# Patient Record
Sex: Female | Born: 1965 | Race: Black or African American | Hispanic: No | Marital: Single | Smoking: Current every day smoker
Health system: Southern US, Community
[De-identification: ages and names within clinical notes are randomized; demographics above are authoritative.]

## PROBLEM LIST (undated history)

## (undated) DIAGNOSIS — F319 Bipolar disorder, unspecified: Secondary | ICD-10-CM

## (undated) DIAGNOSIS — F419 Anxiety disorder, unspecified: Secondary | ICD-10-CM

## (undated) DIAGNOSIS — F4310 Post-traumatic stress disorder, unspecified: Secondary | ICD-10-CM

## (undated) DIAGNOSIS — I10 Essential (primary) hypertension: Secondary | ICD-10-CM

## (undated) HISTORY — PX: BLADDER SURGERY: SHX569

## (undated) HISTORY — PX: TUBAL LIGATION: SHX77

## (undated) HISTORY — PX: CHOLECYSTECTOMY: SHX55

---

## 1998-03-25 ENCOUNTER — Emergency Department (HOSPITAL_COMMUNITY): Admission: EM | Admit: 1998-03-25 | Discharge: 1998-03-25 | Payer: Self-pay | Admitting: Emergency Medicine

## 1998-04-06 ENCOUNTER — Emergency Department (HOSPITAL_COMMUNITY): Admission: EM | Admit: 1998-04-06 | Discharge: 1998-04-06 | Payer: Self-pay | Admitting: Emergency Medicine

## 1998-04-18 ENCOUNTER — Emergency Department (HOSPITAL_COMMUNITY): Admission: EM | Admit: 1998-04-18 | Discharge: 1998-04-18 | Payer: Self-pay | Admitting: Emergency Medicine

## 1998-05-21 ENCOUNTER — Emergency Department (HOSPITAL_COMMUNITY): Admission: EM | Admit: 1998-05-21 | Discharge: 1998-05-21 | Payer: Self-pay | Admitting: Emergency Medicine

## 1998-07-02 ENCOUNTER — Emergency Department (HOSPITAL_COMMUNITY): Admission: EM | Admit: 1998-07-02 | Discharge: 1998-07-02 | Payer: Self-pay | Admitting: Internal Medicine

## 1998-07-27 ENCOUNTER — Emergency Department (HOSPITAL_COMMUNITY): Admission: EM | Admit: 1998-07-27 | Discharge: 1998-07-27 | Payer: Self-pay | Admitting: Emergency Medicine

## 1999-07-30 ENCOUNTER — Emergency Department (HOSPITAL_COMMUNITY): Admission: EM | Admit: 1999-07-30 | Discharge: 1999-07-30 | Payer: Self-pay | Admitting: Emergency Medicine

## 1999-10-13 ENCOUNTER — Emergency Department (HOSPITAL_COMMUNITY): Admission: EM | Admit: 1999-10-13 | Discharge: 1999-10-13 | Payer: Self-pay | Admitting: Emergency Medicine

## 1999-12-04 ENCOUNTER — Emergency Department (HOSPITAL_COMMUNITY): Admission: EM | Admit: 1999-12-04 | Discharge: 1999-12-04 | Payer: Self-pay | Admitting: Emergency Medicine

## 2000-03-02 ENCOUNTER — Emergency Department (HOSPITAL_COMMUNITY): Admission: EM | Admit: 2000-03-02 | Discharge: 2000-03-02 | Payer: Self-pay | Admitting: Emergency Medicine

## 2000-04-02 ENCOUNTER — Emergency Department (HOSPITAL_COMMUNITY): Admission: EM | Admit: 2000-04-02 | Discharge: 2000-04-02 | Payer: Self-pay | Admitting: Emergency Medicine

## 2000-04-10 ENCOUNTER — Emergency Department (HOSPITAL_COMMUNITY): Admission: EM | Admit: 2000-04-10 | Discharge: 2000-04-10 | Payer: Self-pay | Admitting: Emergency Medicine

## 2000-07-13 ENCOUNTER — Emergency Department (HOSPITAL_COMMUNITY): Admission: EM | Admit: 2000-07-13 | Discharge: 2000-07-13 | Payer: Self-pay | Admitting: Emergency Medicine

## 2000-07-13 ENCOUNTER — Encounter: Payer: Self-pay | Admitting: Emergency Medicine

## 2001-01-25 ENCOUNTER — Emergency Department (HOSPITAL_COMMUNITY): Admission: EM | Admit: 2001-01-25 | Discharge: 2001-01-25 | Payer: Self-pay | Admitting: Emergency Medicine

## 2001-05-03 ENCOUNTER — Emergency Department (HOSPITAL_COMMUNITY): Admission: EM | Admit: 2001-05-03 | Discharge: 2001-05-03 | Payer: Self-pay

## 2001-05-28 ENCOUNTER — Emergency Department (HOSPITAL_COMMUNITY): Admission: EM | Admit: 2001-05-28 | Discharge: 2001-05-28 | Payer: Self-pay | Admitting: Emergency Medicine

## 2001-06-13 ENCOUNTER — Emergency Department (HOSPITAL_COMMUNITY): Admission: EM | Admit: 2001-06-13 | Discharge: 2001-06-13 | Payer: Self-pay | Admitting: *Deleted

## 2001-09-26 ENCOUNTER — Emergency Department (HOSPITAL_COMMUNITY): Admission: EM | Admit: 2001-09-26 | Discharge: 2001-09-26 | Payer: Self-pay | Admitting: Emergency Medicine

## 2001-09-26 ENCOUNTER — Encounter: Payer: Self-pay | Admitting: Emergency Medicine

## 2001-10-21 ENCOUNTER — Emergency Department (HOSPITAL_COMMUNITY): Admission: EM | Admit: 2001-10-21 | Discharge: 2001-10-21 | Payer: Self-pay | Admitting: Emergency Medicine

## 2001-10-26 ENCOUNTER — Inpatient Hospital Stay (HOSPITAL_COMMUNITY): Admission: AD | Admit: 2001-10-26 | Discharge: 2001-10-26 | Payer: Self-pay | Admitting: *Deleted

## 2001-10-28 ENCOUNTER — Inpatient Hospital Stay (HOSPITAL_COMMUNITY): Admission: AD | Admit: 2001-10-28 | Discharge: 2001-10-28 | Payer: Self-pay | Admitting: Obstetrics and Gynecology

## 2001-10-28 ENCOUNTER — Encounter: Payer: Self-pay | Admitting: Obstetrics and Gynecology

## 2001-11-01 ENCOUNTER — Emergency Department (HOSPITAL_COMMUNITY): Admission: EM | Admit: 2001-11-01 | Discharge: 2001-11-01 | Payer: Self-pay | Admitting: *Deleted

## 2001-11-08 ENCOUNTER — Emergency Department (HOSPITAL_COMMUNITY): Admission: EM | Admit: 2001-11-08 | Discharge: 2001-11-08 | Payer: Self-pay | Admitting: Emergency Medicine

## 2001-12-11 ENCOUNTER — Emergency Department (HOSPITAL_COMMUNITY): Admission: EM | Admit: 2001-12-11 | Discharge: 2001-12-11 | Payer: Self-pay | Admitting: Emergency Medicine

## 2002-02-20 ENCOUNTER — Emergency Department (HOSPITAL_COMMUNITY): Admission: EM | Admit: 2002-02-20 | Discharge: 2002-02-20 | Payer: Self-pay | Admitting: Emergency Medicine

## 2002-03-03 ENCOUNTER — Emergency Department (HOSPITAL_COMMUNITY): Admission: EM | Admit: 2002-03-03 | Discharge: 2002-03-03 | Payer: Self-pay | Admitting: Emergency Medicine

## 2002-03-04 ENCOUNTER — Encounter: Payer: Self-pay | Admitting: Emergency Medicine

## 2002-03-04 ENCOUNTER — Emergency Department (HOSPITAL_COMMUNITY): Admission: EM | Admit: 2002-03-04 | Discharge: 2002-03-04 | Payer: Self-pay | Admitting: Emergency Medicine

## 2002-03-07 ENCOUNTER — Emergency Department (HOSPITAL_COMMUNITY): Admission: EM | Admit: 2002-03-07 | Discharge: 2002-03-07 | Payer: Self-pay

## 2002-03-19 ENCOUNTER — Emergency Department (HOSPITAL_COMMUNITY): Admission: EM | Admit: 2002-03-19 | Discharge: 2002-03-19 | Payer: Self-pay | Admitting: *Deleted

## 2002-04-02 ENCOUNTER — Emergency Department (HOSPITAL_COMMUNITY): Admission: EM | Admit: 2002-04-02 | Discharge: 2002-04-02 | Payer: Self-pay | Admitting: Emergency Medicine

## 2002-04-19 ENCOUNTER — Emergency Department (HOSPITAL_COMMUNITY): Admission: EM | Admit: 2002-04-19 | Discharge: 2002-04-19 | Payer: Self-pay | Admitting: Emergency Medicine

## 2002-04-19 ENCOUNTER — Emergency Department (HOSPITAL_COMMUNITY): Admission: EM | Admit: 2002-04-19 | Discharge: 2002-04-19 | Payer: Self-pay | Admitting: *Deleted

## 2002-04-25 ENCOUNTER — Emergency Department (HOSPITAL_COMMUNITY): Admission: EM | Admit: 2002-04-25 | Discharge: 2002-04-25 | Payer: Self-pay | Admitting: Emergency Medicine

## 2002-04-26 ENCOUNTER — Emergency Department (HOSPITAL_COMMUNITY): Admission: EM | Admit: 2002-04-26 | Discharge: 2002-04-26 | Payer: Self-pay | Admitting: Emergency Medicine

## 2002-05-23 ENCOUNTER — Emergency Department (HOSPITAL_COMMUNITY): Admission: EM | Admit: 2002-05-23 | Discharge: 2002-05-23 | Payer: Self-pay | Admitting: Emergency Medicine

## 2002-06-09 ENCOUNTER — Emergency Department (HOSPITAL_COMMUNITY): Admission: EM | Admit: 2002-06-09 | Discharge: 2002-06-09 | Payer: Self-pay | Admitting: Emergency Medicine

## 2002-07-05 ENCOUNTER — Emergency Department (HOSPITAL_COMMUNITY): Admission: EM | Admit: 2002-07-05 | Discharge: 2002-07-05 | Payer: Self-pay | Admitting: Emergency Medicine

## 2002-07-14 ENCOUNTER — Emergency Department (HOSPITAL_COMMUNITY): Admission: EM | Admit: 2002-07-14 | Discharge: 2002-07-14 | Payer: Self-pay | Admitting: Emergency Medicine

## 2002-08-02 ENCOUNTER — Emergency Department (HOSPITAL_COMMUNITY): Admission: EM | Admit: 2002-08-02 | Discharge: 2002-08-02 | Payer: Self-pay | Admitting: Emergency Medicine

## 2002-08-02 ENCOUNTER — Emergency Department (HOSPITAL_COMMUNITY): Admission: AC | Admit: 2002-08-02 | Discharge: 2002-08-03 | Payer: Self-pay

## 2002-08-03 ENCOUNTER — Encounter: Payer: Self-pay | Admitting: General Surgery

## 2002-09-30 ENCOUNTER — Emergency Department (HOSPITAL_COMMUNITY): Admission: EM | Admit: 2002-09-30 | Discharge: 2002-09-30 | Payer: Self-pay | Admitting: Emergency Medicine

## 2002-10-05 ENCOUNTER — Emergency Department (HOSPITAL_COMMUNITY): Admission: EM | Admit: 2002-10-05 | Discharge: 2002-10-05 | Payer: Self-pay | Admitting: Emergency Medicine

## 2002-10-13 ENCOUNTER — Encounter: Payer: Self-pay | Admitting: Emergency Medicine

## 2002-10-13 ENCOUNTER — Emergency Department (HOSPITAL_COMMUNITY): Admission: EM | Admit: 2002-10-13 | Discharge: 2002-10-13 | Payer: Self-pay | Admitting: Emergency Medicine

## 2002-10-19 ENCOUNTER — Encounter: Admission: RE | Admit: 2002-10-19 | Discharge: 2002-11-10 | Payer: Self-pay | Admitting: Family Medicine

## 2002-10-31 ENCOUNTER — Emergency Department (HOSPITAL_COMMUNITY): Admission: EM | Admit: 2002-10-31 | Discharge: 2002-10-31 | Payer: Self-pay | Admitting: Emergency Medicine

## 2002-11-18 ENCOUNTER — Emergency Department (HOSPITAL_COMMUNITY): Admission: EM | Admit: 2002-11-18 | Discharge: 2002-11-18 | Payer: Self-pay | Admitting: Emergency Medicine

## 2003-02-18 ENCOUNTER — Emergency Department (HOSPITAL_COMMUNITY): Admission: EM | Admit: 2003-02-18 | Discharge: 2003-02-19 | Payer: Self-pay | Admitting: Emergency Medicine

## 2003-03-10 ENCOUNTER — Emergency Department (HOSPITAL_COMMUNITY): Admission: EM | Admit: 2003-03-10 | Discharge: 2003-03-10 | Payer: Self-pay | Admitting: Emergency Medicine

## 2003-06-05 ENCOUNTER — Emergency Department (HOSPITAL_COMMUNITY): Admission: EM | Admit: 2003-06-05 | Discharge: 2003-06-05 | Payer: Self-pay | Admitting: Emergency Medicine

## 2003-07-29 ENCOUNTER — Emergency Department (HOSPITAL_COMMUNITY): Admission: AD | Admit: 2003-07-29 | Discharge: 2003-07-29 | Payer: Self-pay | Admitting: Emergency Medicine

## 2003-09-11 ENCOUNTER — Emergency Department (HOSPITAL_COMMUNITY): Admission: EM | Admit: 2003-09-11 | Discharge: 2003-09-11 | Payer: Self-pay | Admitting: Emergency Medicine

## 2003-10-21 ENCOUNTER — Emergency Department (HOSPITAL_COMMUNITY): Admission: EM | Admit: 2003-10-21 | Discharge: 2003-10-21 | Payer: Self-pay | Admitting: Emergency Medicine

## 2003-12-07 ENCOUNTER — Ambulatory Visit (HOSPITAL_COMMUNITY): Admission: RE | Admit: 2003-12-07 | Discharge: 2003-12-07 | Payer: Self-pay | Admitting: Urology

## 2004-09-02 ENCOUNTER — Emergency Department (HOSPITAL_COMMUNITY): Admission: EM | Admit: 2004-09-02 | Discharge: 2004-09-02 | Payer: Self-pay | Admitting: Emergency Medicine

## 2004-09-29 ENCOUNTER — Emergency Department (HOSPITAL_COMMUNITY): Admission: EM | Admit: 2004-09-29 | Discharge: 2004-09-29 | Payer: Self-pay | Admitting: Emergency Medicine

## 2004-10-18 ENCOUNTER — Emergency Department (HOSPITAL_COMMUNITY): Admission: EM | Admit: 2004-10-18 | Discharge: 2004-10-19 | Payer: Self-pay | Admitting: Emergency Medicine

## 2004-10-28 ENCOUNTER — Emergency Department (HOSPITAL_COMMUNITY): Admission: EM | Admit: 2004-10-28 | Discharge: 2004-10-28 | Payer: Self-pay | Admitting: Emergency Medicine

## 2004-11-05 ENCOUNTER — Emergency Department (HOSPITAL_COMMUNITY): Admission: EM | Admit: 2004-11-05 | Discharge: 2004-11-05 | Payer: Self-pay | Admitting: Emergency Medicine

## 2005-01-02 ENCOUNTER — Emergency Department (HOSPITAL_COMMUNITY): Admission: EM | Admit: 2005-01-02 | Discharge: 2005-01-02 | Payer: Self-pay | Admitting: Emergency Medicine

## 2005-06-06 ENCOUNTER — Emergency Department (HOSPITAL_COMMUNITY): Admission: EM | Admit: 2005-06-06 | Discharge: 2005-06-06 | Payer: Self-pay | Admitting: Emergency Medicine

## 2005-06-21 ENCOUNTER — Emergency Department (HOSPITAL_COMMUNITY): Admission: EM | Admit: 2005-06-21 | Discharge: 2005-06-21 | Payer: Self-pay | Admitting: Emergency Medicine

## 2005-07-23 ENCOUNTER — Emergency Department (HOSPITAL_COMMUNITY): Admission: EM | Admit: 2005-07-23 | Discharge: 2005-07-23 | Payer: Self-pay | Admitting: Emergency Medicine

## 2005-09-14 ENCOUNTER — Emergency Department (HOSPITAL_COMMUNITY): Admission: EM | Admit: 2005-09-14 | Discharge: 2005-09-14 | Payer: Self-pay | Admitting: Emergency Medicine

## 2006-05-09 ENCOUNTER — Emergency Department (HOSPITAL_COMMUNITY): Admission: EM | Admit: 2006-05-09 | Discharge: 2006-05-09 | Payer: Self-pay | Admitting: Emergency Medicine

## 2006-08-27 ENCOUNTER — Emergency Department (HOSPITAL_COMMUNITY): Admission: EM | Admit: 2006-08-27 | Discharge: 2006-08-27 | Payer: Self-pay | Admitting: Emergency Medicine

## 2007-01-04 ENCOUNTER — Emergency Department (HOSPITAL_COMMUNITY): Admission: EM | Admit: 2007-01-04 | Discharge: 2007-01-04 | Payer: Self-pay | Admitting: Emergency Medicine

## 2007-01-17 ENCOUNTER — Encounter: Admission: RE | Admit: 2007-01-17 | Discharge: 2007-01-17 | Payer: Self-pay | Admitting: Gastroenterology

## 2007-02-16 ENCOUNTER — Emergency Department (HOSPITAL_COMMUNITY): Admission: EM | Admit: 2007-02-16 | Discharge: 2007-02-16 | Payer: Self-pay | Admitting: Emergency Medicine

## 2007-02-23 ENCOUNTER — Encounter (INDEPENDENT_AMBULATORY_CARE_PROVIDER_SITE_OTHER): Payer: Self-pay | Admitting: General Surgery

## 2007-02-23 ENCOUNTER — Ambulatory Visit (HOSPITAL_COMMUNITY): Admission: RE | Admit: 2007-02-23 | Discharge: 2007-02-23 | Payer: Self-pay | Admitting: General Surgery

## 2007-12-02 ENCOUNTER — Ambulatory Visit (HOSPITAL_COMMUNITY): Admission: RE | Admit: 2007-12-02 | Discharge: 2007-12-02 | Payer: Self-pay | Admitting: Obstetrics and Gynecology

## 2007-12-21 ENCOUNTER — Emergency Department (HOSPITAL_COMMUNITY): Admission: EM | Admit: 2007-12-21 | Discharge: 2007-12-21 | Payer: Self-pay | Admitting: Emergency Medicine

## 2007-12-24 ENCOUNTER — Encounter (INDEPENDENT_AMBULATORY_CARE_PROVIDER_SITE_OTHER): Payer: Self-pay | Admitting: Obstetrics and Gynecology

## 2007-12-24 ENCOUNTER — Ambulatory Visit (HOSPITAL_COMMUNITY): Admission: RE | Admit: 2007-12-24 | Discharge: 2007-12-24 | Payer: Self-pay | Admitting: Obstetrics and Gynecology

## 2008-01-20 ENCOUNTER — Emergency Department (HOSPITAL_COMMUNITY): Admission: EM | Admit: 2008-01-20 | Discharge: 2008-01-20 | Payer: Self-pay | Admitting: Emergency Medicine

## 2008-02-13 ENCOUNTER — Emergency Department (HOSPITAL_COMMUNITY): Admission: EM | Admit: 2008-02-13 | Discharge: 2008-02-13 | Payer: Self-pay | Admitting: Emergency Medicine

## 2008-04-15 ENCOUNTER — Emergency Department (HOSPITAL_COMMUNITY): Admission: EM | Admit: 2008-04-15 | Discharge: 2008-04-15 | Payer: Self-pay | Admitting: Emergency Medicine

## 2008-06-06 ENCOUNTER — Emergency Department (HOSPITAL_COMMUNITY): Admission: EM | Admit: 2008-06-06 | Discharge: 2008-06-06 | Payer: Self-pay | Admitting: Emergency Medicine

## 2008-07-01 ENCOUNTER — Emergency Department (HOSPITAL_COMMUNITY): Admission: EM | Admit: 2008-07-01 | Discharge: 2008-07-01 | Payer: Self-pay | Admitting: Emergency Medicine

## 2009-01-21 ENCOUNTER — Emergency Department (HOSPITAL_COMMUNITY): Admission: EM | Admit: 2009-01-21 | Discharge: 2009-01-21 | Payer: Self-pay | Admitting: Emergency Medicine

## 2009-02-04 ENCOUNTER — Emergency Department (HOSPITAL_COMMUNITY): Admission: EM | Admit: 2009-02-04 | Discharge: 2009-02-04 | Payer: Self-pay | Admitting: Emergency Medicine

## 2009-02-08 ENCOUNTER — Emergency Department (HOSPITAL_COMMUNITY): Admission: EM | Admit: 2009-02-08 | Discharge: 2009-02-08 | Payer: Self-pay | Admitting: Emergency Medicine

## 2009-02-08 ENCOUNTER — Encounter (INDEPENDENT_AMBULATORY_CARE_PROVIDER_SITE_OTHER): Payer: Self-pay | Admitting: Emergency Medicine

## 2009-02-08 ENCOUNTER — Ambulatory Visit: Payer: Self-pay | Admitting: Vascular Surgery

## 2009-04-30 ENCOUNTER — Emergency Department (HOSPITAL_COMMUNITY): Admission: EM | Admit: 2009-04-30 | Discharge: 2009-04-30 | Payer: Self-pay | Admitting: Emergency Medicine

## 2009-06-09 ENCOUNTER — Emergency Department (HOSPITAL_COMMUNITY): Admission: EM | Admit: 2009-06-09 | Discharge: 2009-06-09 | Payer: Self-pay | Admitting: Emergency Medicine

## 2009-06-11 ENCOUNTER — Emergency Department (HOSPITAL_COMMUNITY): Admission: EM | Admit: 2009-06-11 | Discharge: 2009-06-11 | Payer: Self-pay | Admitting: Emergency Medicine

## 2009-07-12 ENCOUNTER — Emergency Department (HOSPITAL_COMMUNITY): Admission: EM | Admit: 2009-07-12 | Discharge: 2009-07-12 | Payer: Self-pay | Admitting: Emergency Medicine

## 2009-07-18 ENCOUNTER — Emergency Department (HOSPITAL_COMMUNITY): Admission: EM | Admit: 2009-07-18 | Discharge: 2009-07-18 | Payer: Self-pay | Admitting: Emergency Medicine

## 2009-08-07 ENCOUNTER — Emergency Department (HOSPITAL_COMMUNITY): Admission: EM | Admit: 2009-08-07 | Discharge: 2009-08-07 | Payer: Self-pay | Admitting: Emergency Medicine

## 2009-09-21 ENCOUNTER — Emergency Department (HOSPITAL_COMMUNITY): Admission: EM | Admit: 2009-09-21 | Discharge: 2009-09-21 | Payer: Self-pay | Admitting: Emergency Medicine

## 2009-11-17 ENCOUNTER — Emergency Department (HOSPITAL_COMMUNITY): Admission: EM | Admit: 2009-11-17 | Discharge: 2009-11-17 | Payer: Self-pay | Admitting: Emergency Medicine

## 2009-12-01 ENCOUNTER — Emergency Department (HOSPITAL_COMMUNITY): Admission: EM | Admit: 2009-12-01 | Discharge: 2009-12-01 | Payer: Self-pay | Admitting: Emergency Medicine

## 2010-01-24 ENCOUNTER — Emergency Department (HOSPITAL_COMMUNITY): Admission: EM | Admit: 2010-01-24 | Discharge: 2010-01-24 | Payer: Self-pay | Admitting: Emergency Medicine

## 2010-02-05 ENCOUNTER — Emergency Department (HOSPITAL_COMMUNITY): Admission: EM | Admit: 2010-02-05 | Discharge: 2010-02-05 | Payer: Self-pay | Admitting: Emergency Medicine

## 2010-02-19 ENCOUNTER — Emergency Department (HOSPITAL_COMMUNITY): Admission: EM | Admit: 2010-02-19 | Discharge: 2010-02-19 | Payer: Self-pay | Admitting: Emergency Medicine

## 2010-03-12 ENCOUNTER — Emergency Department (HOSPITAL_COMMUNITY): Admission: EM | Admit: 2010-03-12 | Discharge: 2010-03-12 | Payer: Self-pay | Admitting: Emergency Medicine

## 2010-04-05 ENCOUNTER — Ambulatory Visit: Payer: Self-pay | Admitting: Internal Medicine

## 2010-04-05 ENCOUNTER — Observation Stay (HOSPITAL_COMMUNITY): Admission: EM | Admit: 2010-04-05 | Discharge: 2010-04-06 | Payer: Self-pay | Admitting: Emergency Medicine

## 2010-04-06 ENCOUNTER — Encounter (INDEPENDENT_AMBULATORY_CARE_PROVIDER_SITE_OTHER): Payer: Self-pay | Admitting: Internal Medicine

## 2010-05-14 ENCOUNTER — Emergency Department (HOSPITAL_COMMUNITY): Admission: EM | Admit: 2010-05-14 | Discharge: 2010-05-14 | Payer: Self-pay | Admitting: Emergency Medicine

## 2010-08-19 ENCOUNTER — Encounter: Payer: Self-pay | Admitting: Urology

## 2010-10-11 LAB — POCT I-STAT, CHEM 8
Calcium, Ion: 1.17 mmol/L (ref 1.12–1.32)
Chloride: 104 mEq/L (ref 96–112)
HCT: 41 % (ref 36.0–46.0)
TCO2: 25 mmol/L (ref 0–100)

## 2010-10-11 LAB — URINALYSIS, ROUTINE W REFLEX MICROSCOPIC
Glucose, UA: NEGATIVE mg/dL
Specific Gravity, Urine: 1.026 (ref 1.005–1.030)
Urobilinogen, UA: 1 mg/dL (ref 0.0–1.0)
pH: 6 (ref 5.0–8.0)

## 2010-10-11 LAB — POCT CARDIAC MARKERS
CKMB, poc: <1 ng/mL — ABNORMAL LOW (ref 1.0–8.0)
CKMB, poc: <1 ng/mL — ABNORMAL LOW (ref 1.0–8.0)
Myoglobin, poc: 37.2 ng/mL (ref 12–200)
Troponin i, poc: <0.05 ng/mL (ref 0.00–0.09)

## 2010-10-11 LAB — DIFFERENTIAL
Basophils Absolute: 0 10*3/uL (ref 0.0–0.1)
Basophils Relative: 0 % (ref 0–1)
Eosinophils Relative: 0 % (ref 0–5)
Monocytes Absolute: 0.6 10*3/uL (ref 0.1–1.0)
Neutro Abs: 8.8 10*3/uL — ABNORMAL HIGH (ref 1.7–7.7)

## 2010-10-11 LAB — URINE MICROSCOPIC-ADD ON

## 2010-10-11 LAB — CBC
HCT: 37.4 % (ref 36.0–46.0)
MCHC: 34.5 g/dL (ref 30.0–36.0)
RDW: 13.7 % (ref 11.5–15.5)

## 2010-10-12 LAB — CBC
HCT: 40.2 % (ref 36.0–46.0)
MCHC: 34.5 g/dL (ref 30.0–36.0)
RDW: 14.1 % (ref 11.5–15.5)

## 2010-10-12 LAB — DIFFERENTIAL
Basophils Absolute: 0.1 10*3/uL (ref 0.0–0.1)
Basophils Relative: 2 % — ABNORMAL HIGH (ref 0–1)
Eosinophils Absolute: 0.1 10*3/uL (ref 0.0–0.7)
Monocytes Absolute: 0.4 10*3/uL (ref 0.1–1.0)
Neutro Abs: 4.4 10*3/uL (ref 1.7–7.7)
Neutrophils Relative %: 56 % (ref 43–77)

## 2010-10-12 LAB — BASIC METABOLIC PANEL
BUN: 6 mg/dL (ref 6–23)
Calcium: 9.2 mg/dL (ref 8.4–10.5)
GFR calc non Af Amer: >60 mL/min (ref >60)
Glucose, Bld: 95 mg/dL (ref 70–99)

## 2010-10-13 LAB — URINALYSIS, ROUTINE W REFLEX MICROSCOPIC
Nitrite: NEGATIVE
Protein, ur: NEGATIVE mg/dL
Specific Gravity, Urine: 1.027 (ref 1.005–1.030)
Urobilinogen, UA: 0.2 mg/dL (ref 0.0–1.0)

## 2010-10-13 LAB — URINE MICROSCOPIC-ADD ON

## 2010-10-13 LAB — POCT PREGNANCY, URINE: Preg Test, Ur: NEGATIVE

## 2010-10-16 LAB — URINALYSIS, ROUTINE W REFLEX MICROSCOPIC
Bilirubin Urine: NEGATIVE
Ketones, ur: NEGATIVE mg/dL
Nitrite: NEGATIVE
Protein, ur: NEGATIVE mg/dL
Specific Gravity, Urine: 1.027 (ref 1.005–1.030)
Urobilinogen, UA: 0.2 mg/dL (ref 0.0–1.0)
Urobilinogen, UA: 0.2 mg/dL (ref 0.0–1.0)
pH: 6 (ref 5.0–8.0)

## 2010-10-16 LAB — URINE MICROSCOPIC-ADD ON

## 2010-10-16 LAB — URINE CULTURE: Colony Count: >=100000

## 2010-10-31 LAB — URINALYSIS, ROUTINE W REFLEX MICROSCOPIC
Bilirubin Urine: NEGATIVE
Nitrite: NEGATIVE
Specific Gravity, Urine: 1.022 (ref 1.005–1.030)
Urobilinogen, UA: 0.2 mg/dL (ref 0.0–1.0)
pH: 5.5 (ref 5.0–8.0)

## 2010-10-31 LAB — URINE MICROSCOPIC-ADD ON

## 2010-11-01 LAB — URINALYSIS, ROUTINE W REFLEX MICROSCOPIC
Glucose, UA: NEGATIVE mg/dL
Leukocytes, UA: NEGATIVE
Nitrite: NEGATIVE
Specific Gravity, Urine: 1.025 (ref 1.005–1.030)
pH: 6 (ref 5.0–8.0)

## 2010-11-01 LAB — DIFFERENTIAL
Basophils Absolute: 0.1 10*3/uL (ref 0.0–0.1)
Eosinophils Relative: 2 % (ref 0–5)
Lymphocytes Relative: 41 % (ref 12–46)
Lymphs Abs: 3.9 10*3/uL (ref 0.7–4.0)
Monocytes Absolute: 0.7 10*3/uL (ref 0.1–1.0)
Neutro Abs: 4.6 10*3/uL (ref 1.7–7.7)

## 2010-11-01 LAB — POCT I-STAT, CHEM 8
BUN: 9 mg/dL (ref 6–23)
Calcium, Ion: 1.12 mmol/L (ref 1.12–1.32)
Chloride: 105 mEq/L (ref 96–112)
Creatinine, Ser: 0.6 mg/dL (ref 0.4–1.2)
Sodium: 137 mEq/L (ref 135–145)
TCO2: 23 mmol/L (ref 0–100)

## 2010-11-01 LAB — GC/CHLAMYDIA PROBE AMP, GENITAL
Chlamydia, DNA Probe: NEGATIVE
GC Probe Amp, Genital: NEGATIVE

## 2010-11-01 LAB — CBC
HCT: 39.6 % (ref 36.0–46.0)
Hemoglobin: 13.2 g/dL (ref 12.0–15.0)
RDW: 14.4 % (ref 11.5–15.5)
WBC: 9.4 10*3/uL (ref 4.0–10.5)

## 2010-11-01 LAB — URINE MICROSCOPIC-ADD ON

## 2010-11-05 LAB — URINE MICROSCOPIC-ADD ON

## 2010-11-05 LAB — URINALYSIS, ROUTINE W REFLEX MICROSCOPIC
Glucose, UA: NEGATIVE mg/dL
Leukocytes, UA: NEGATIVE
Protein, ur: NEGATIVE mg/dL
pH: 6 (ref 5.0–8.0)

## 2010-11-05 LAB — GC/CHLAMYDIA PROBE AMP, GENITAL: Chlamydia, DNA Probe: NEGATIVE

## 2010-11-05 LAB — POCT PREGNANCY, URINE: Preg Test, Ur: NEGATIVE

## 2010-11-05 LAB — RPR: RPR Ser Ql: NONREACTIVE

## 2010-12-11 NOTE — Op Note (Signed)
NAMEGEORGANA, Jill [name] [name]               ACCOUNT NO.:  [id number]   MEDICAL RECORD NO.:  [id number]          PATIENT TYPE:  AMB   LOCATION:  DAY                          FACILITY:  [facility]   PHYSICIAN:  Timothy E. Earlene Plater, M.D. DATE OF BIRTH:  1966-04-11   DATE OF PROCEDURE:  02/23/2007  DATE OF DISCHARGE:                               OPERATIVE REPORT   PREOPERATIVE DIAGNOSES:  1. Cholecystolithiasis.  2. Question mass of gallbladder.   POSTOPERATIVE DIAGNOSES:  1. Cholecystitis.  2. Mass of gallbladder.  3. Abnormal cholangiogram.   PROCEDURE:  Laparoscopic cholecystectomy with cholangiogram.   SURGEON:  Kendrick Ranch, M.D.   ANESTHESIA:  General.   ASSISTANT:  Wilmon Arms. Tsuei, M.D.   INDICATION:  Jill [name] [name] is otherwise healthy, 22.  She has had several  attacks of right upper quadrant pain; the worst one, she visited the  emergency room, where first ultrasound was done, then CT, showing  dilated biliary ducts, question mass gallbladder, no specific stones.  She has now defervesced, though she is still taking Percocet for pain  and care was diet and she wishes to proceed fairly urgently with the  surgery.  This has been completely discussed with her in detail  including the procedure, the expectations, the potential complications  and the recovery period.  She is seen, identified and the permit signed.   She was taken to the operating room and placed supine and general  endotracheal anesthesia administered.  The abdomen was prepped and  draped with Betadine.  Marcaine 0.25% with epinephrine was used  throughout prior to each incision.  An infraumbilical incision was made  through the old tubal ligation scar, the fascia identified vertically  and opened vertically, the peritoneum entered.  A few adhesions were  bluntly taken down and then a #1 Vicryl suture placed, figure-of-eight,  across that wound and the Hasson cannula placed between the suture it  was tied.  The abdomen  insufflated nicely.  There were no gross  abnormalities with the peritoneoscopy, except for thickened gallbladder.  The pelvis was free of disease.  The liver appeared normal.  A second 10-  mm trocar was placed in the mid epigastrium and two 5-mm trocars in the  right upper quadrant.  The gallbladder was grasped and placed on  tension.  Some adhesions to the duodenum were taken down sharply and  then the cystic duct was dissected out completely under direct vision;  it appeared normal.  A cut was made in the cystic duct and bile flowed.  A clip was placed on the gallbladder side of that incision.  Then a  cholangiogram catheter was passed percutaneously and inserted into the  cystic duct stump.  A clip was placed and real-time fluoroscopy carried  out; it was clearly abnormal; however, it did not show obstruction or  stones.  There was flow of dye into the duodenum.  There appeared to be  either a significant sigmoid turn of the distal common duct or even  perhaps a redundancy.  The cystic duct was normal with a curlicue  pattern and was of good  length.  The remainder of the bile ducts  appeared dilated.  No obstruction or stones were seen.  So, we proceeded  to completely divide the cystic duct; it was triply clipped.  The cystic  artery was dissected out, triply clipped and divided.  The remainder of  the gallbladder was dissected out from the base of the liver bed without  difficulty or complications.  The radiologist did review the real-time  fluoroscopy and in essence agreed with our findings.  We will leave to  their final judgment and the patient will be referred back to [facility].  The gallbladder was placed in an EndoCatch bag; its base was carefully  visualized,  irrigated and there were no complications.  The bag was  closed.  Irrigation was carried out.  Again, view of the pelvis was  negative and the gallbladder was removed through the infraumbilical  incision without  complication.  It was tied under direct vision and  there was no bleeding or complication.  Irrigant was removed.  All  trocars were removed under direct vision and then all air was allowed to  escape.  Each incision was closed with 3-0 Monocryl and Steri-Strips.  Final counts were correct.  She tolerated it well and was removed to the  recovery room in good condition.  Written and verbal instructions were  given including additional Percocet and she will be followed as an  outpatient.Sheppard Plumber. Earlene Plater, M.D.  Electronically Signed     TED/MEDQ  D:  02/23/2007  T:  02/24/2007  Job:  [id number]   cc:   Bryan Lemma. Manus Gunning, M.D.  Fax: [phone]   [facility]

## 2010-12-11 NOTE — H&P (Signed)
Jill [name], Jill [name]               ACCOUNT NO.:  [id number]   MEDICAL RECORD NO.:  [id number]          PATIENT TYPE:  AMB   LOCATION:  SDC                           FACILITY:  [facility]   PHYSICIAN:  Hal Morales, M.D.DATE OF BIRTH:  [birth year]   DATE OF ADMISSION:  DATE OF DISCHARGE:                              HISTORY & PHYSICAL   DATE OF OPERATION:  Dec 24, 2007.   HISTORY OF PRESENT ILLNESS:  The patient is a 45 year old black female,  para 3-0-1-2, who presents for excision of perianal condylomata.  These  have been present for less than 1 year but have started to cause  significant itching, and the patient wants them removed.  Last menstrual  period was Dec 08, 2007.  Contraception is tubal ligation.   PAST MEDICAL HISTORY:  GYN:  Menarche at age 30 with monthly menses  lasting 4 days, pretty severe cramps.  Pap smear history:  The patient  had an abnormal Pap smear approximately 8 years ago and underwent  cryosurgery for that.  She has had a history of herpes and bacterial and  yeast vaginitis.  She uses tubal ligation as her method of  contraception.   OBSTETRICAL HISTORY:  The patient had 2 full-term deliveries and 1  elective termination of pregnancy in the early second trimester.   FAMILY HISTORY:  Positive for hypertension, diabetes and heart disease.   SURGICAL HISTORY:  The patient had a cholecystectomy in 2008.   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   CURRENT MEDICATIONS:  Flonase daily and Zovirax.   REVIEW OF SYSTEMS:  Negative except as mentioned above.   PHYSICAL EXAMINATION:  GENERAL:  The patient is a well-developed black  female in no acute distress.  LUNGS:  Clear.  HEART:  Regular rate and rhythm.  ABDOMEN:  Soft without masses or organomegaly.  EXTREMITIES:  No clubbing, cyanosis or edema.  PELVIC:  EG, BUS within normal limits.  The vagina is rugous.  The  cervix is without  gross lesions.  Uterus is not enlarged though  increased body mass index limits examination.  Rectovaginal shows  perianal condylomata.   IMPRESSION:  1. 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  2. History of herpes simplex virus in the past.   DISPOSITION:  A discussion was held with the patient concerning removal  of the aforementioned warts.  She wishes to proceed at [facility]  under anesthesia.  The risks of anesthesia, bleeding, infection and  damage to adjacent organs have all been explained.      Hal Morales, M.D.  Electronically Signed     VPH/MEDQ  D:  12/23/2007  T:  12/23/2007  Job:  [id number]

## 2010-12-11 NOTE — Op Note (Signed)
[name], Jill               ACCOUNT NO.:  [id number]   MEDICAL RECORD NO.:  [id number]          PATIENT TYPE:  AMB   LOCATION:  SDC                           FACILITY:  [facility]   PHYSICIAN:  Hal Morales, M.D.DATE OF BIRTH:  April 21, 1966   DATE OF PROCEDURE:  12/24/2007  DATE OF DISCHARGE:                               OPERATIVE REPORT   PREOPERATIVE DIAGNOSIS:  Perianal condylomata.   POSTOPERATIVE DIAGNOSIS:  Perianal condylomata.   OPERATION:  Excision of perianal condylomata.   SURGEON:  Hal Morales, M.D.   ANESTHESIA:  Local.   ESTIMATED BLOOD LOSS:  Less than 25 mL.   COMPLICATIONS:  None.   FINDINGS:  The patient had numerous condylomata in the perianal area  that ranging in size from 5 mm to 30 mm.   PROCEDURE:  The patient was taken to the operating room and placed on  the operating table in the lithotomy position.  The perineum and  perianal area were cleansed with Betadine.  The areas containing  condylomata were infiltrated with a solution of 30 mL 0.25% Marcaine and  20 mL 2% Xylocaine.  A total of 38 mL of this solution was used for  infiltration.  The condylomata were then successively excised sharply  with a scalpel, and the large defects closed with subcuticular sutures  of 3-0 Vicryl.  The smaller defects were cauterized along the base to  allow for adequate hemostasis.  Once adequate hemostasis had been  achieved, the operative site was covered in Silvadene cream and  hemostasis remained adequate.  The patient was then taken to the  recovery room in satisfactory condition having tolerated the procedure  well with sponge and instrument counts correct.  The specimen was sent  to pathology.      Hal Morales, M.D.  Electronically Signed     VPH/MEDQ  D:  12/24/2007  T:  12/25/2007  Job:  [id number]

## 2011-02-19 ENCOUNTER — Emergency Department (HOSPITAL_COMMUNITY)
Admission: EM | Admit: 2011-02-19 | Discharge: 2011-02-19 | Disposition: A | Discharge status: Home / Self Care | Payer: Self-pay | Attending: Emergency Medicine | Admitting: Emergency Medicine

## 2011-02-19 DIAGNOSIS — L02419 Cutaneous abscess of limb, unspecified: Secondary | ICD-10-CM | POA: Insufficient documentation

## 2011-02-19 DIAGNOSIS — L989 Disorder of the skin and subcutaneous tissue, unspecified: Secondary | ICD-10-CM | POA: Insufficient documentation

## 2011-02-19 DIAGNOSIS — L03119 Cellulitis of unspecified part of limb: Secondary | ICD-10-CM | POA: Insufficient documentation

## 2011-02-19 DIAGNOSIS — I10 Essential (primary) hypertension: Secondary | ICD-10-CM | POA: Insufficient documentation

## 2011-04-23 IMAGING — CR DG KNEE COMPLETE 4+V*R*
4 series · 4 of 4 positions shown · non-contrast
Comparison: 02/04/2009

CLINICAL DATA: Twisted right knee - medial pain

RIGHT KNEE - COMPLETE 4+ VIEW

[t knee ap right]
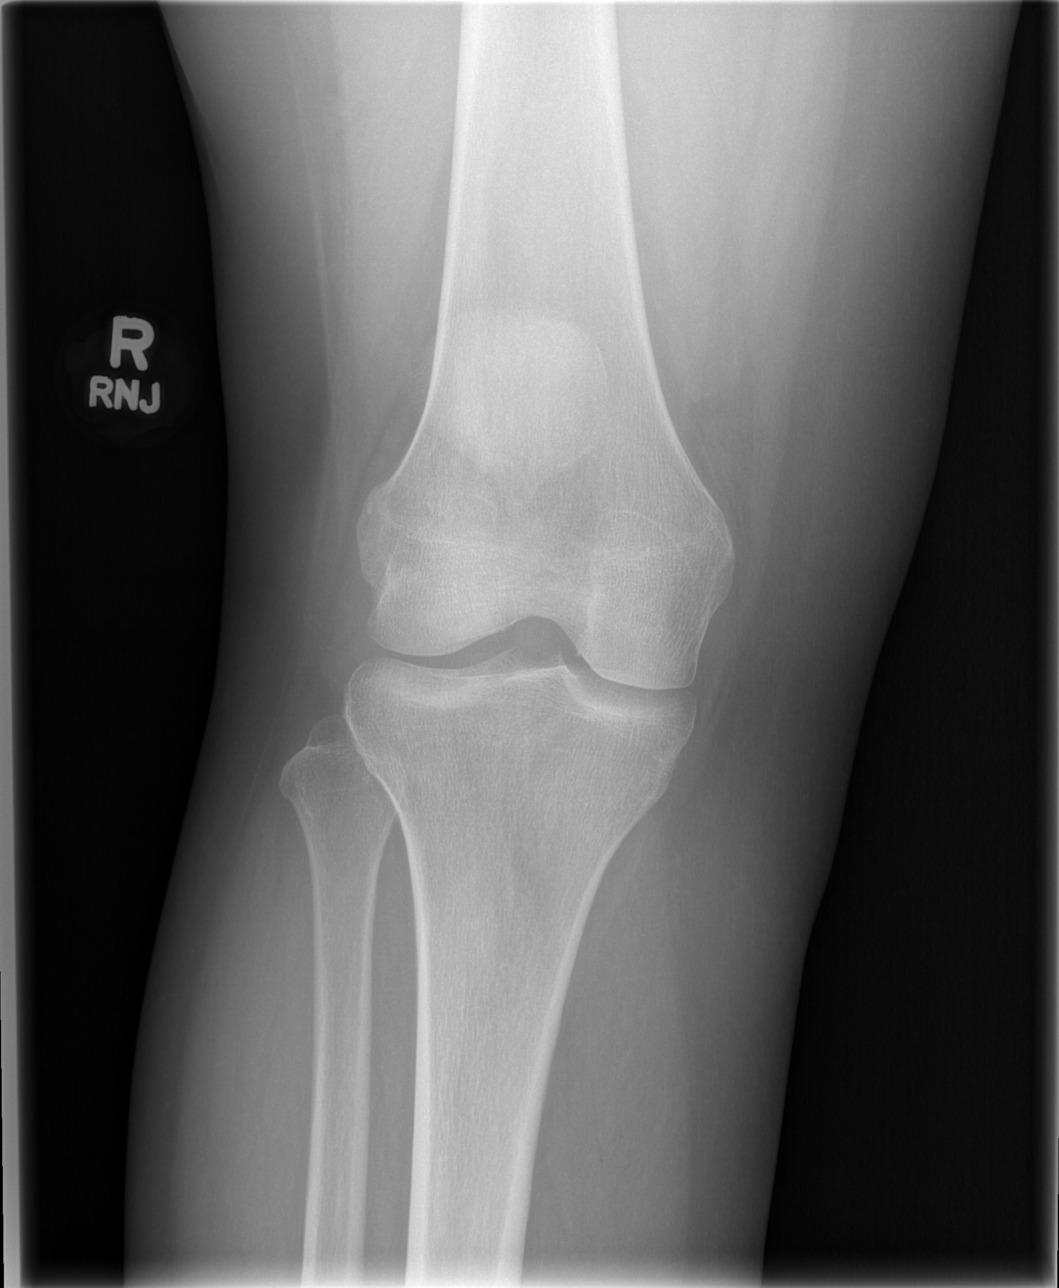

[t knee oblique right (1 of 2)]
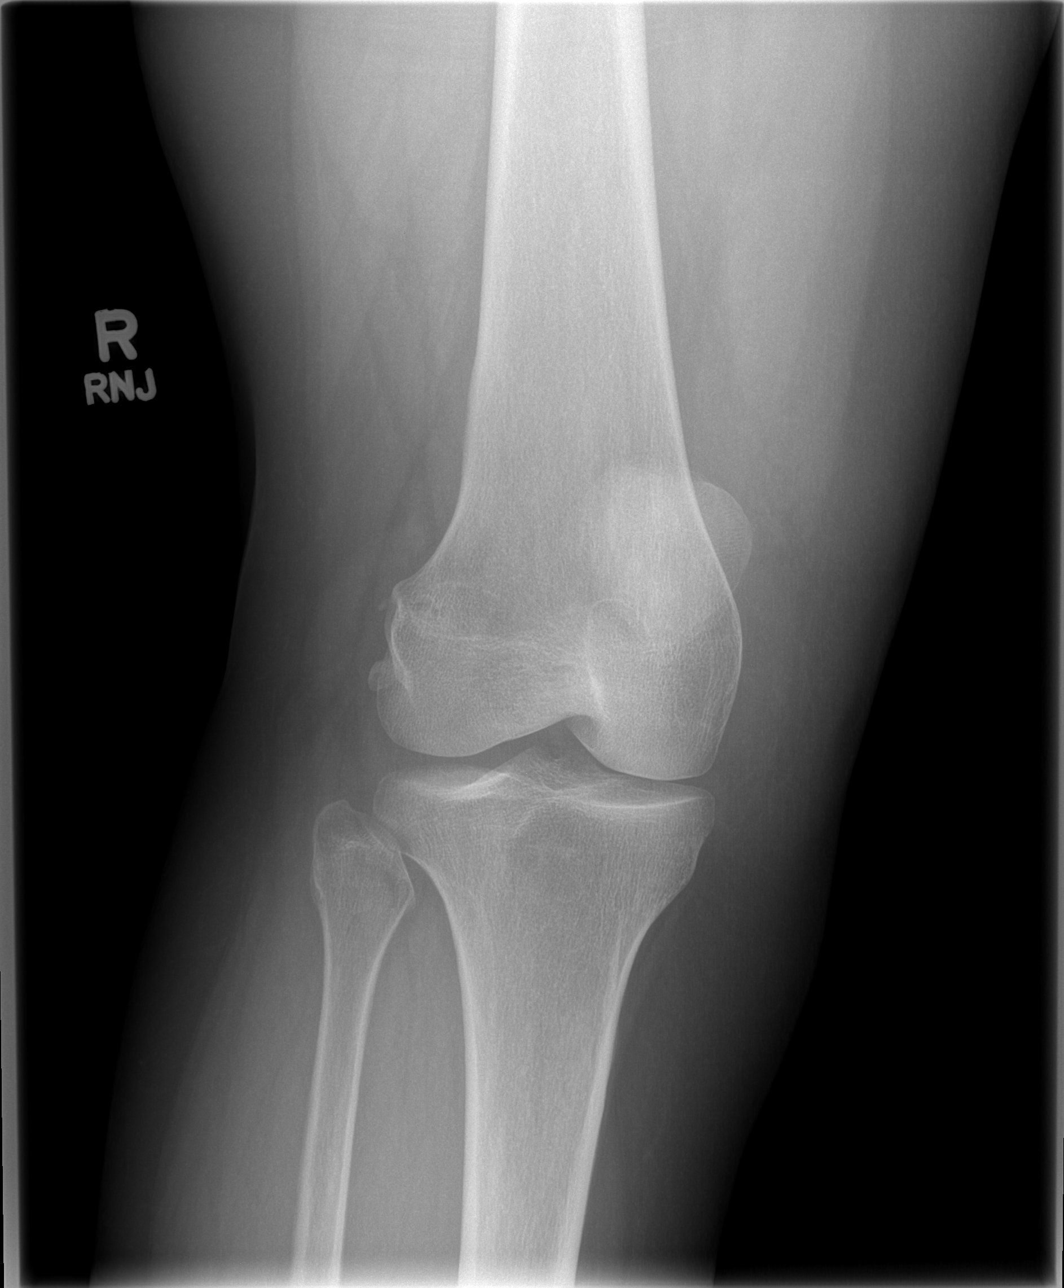

[t knee oblique right (2 of 2)]
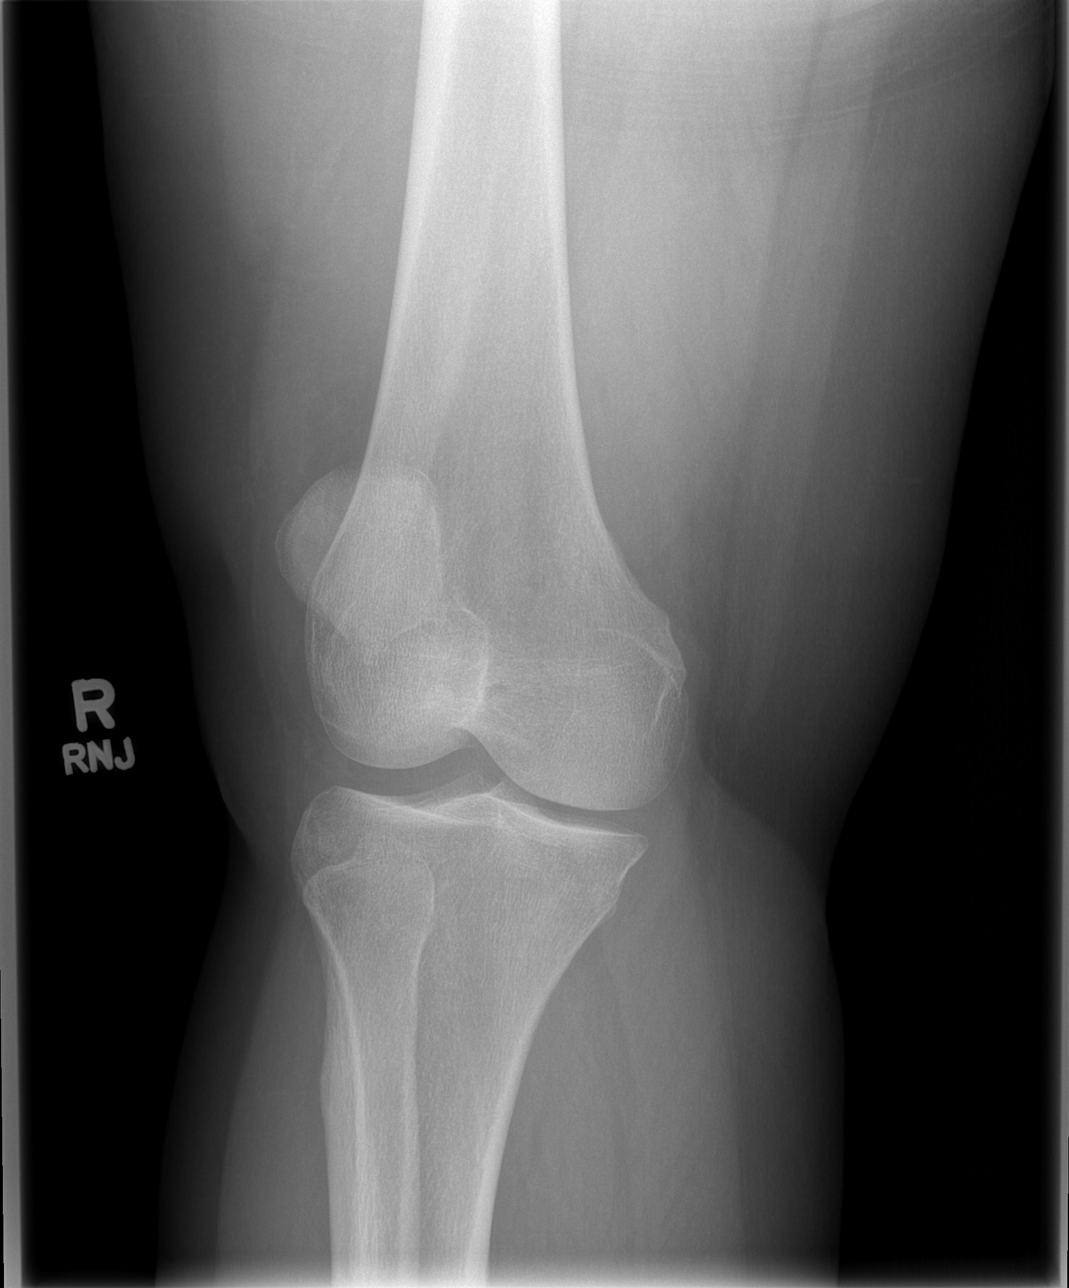

[t knee lat right]
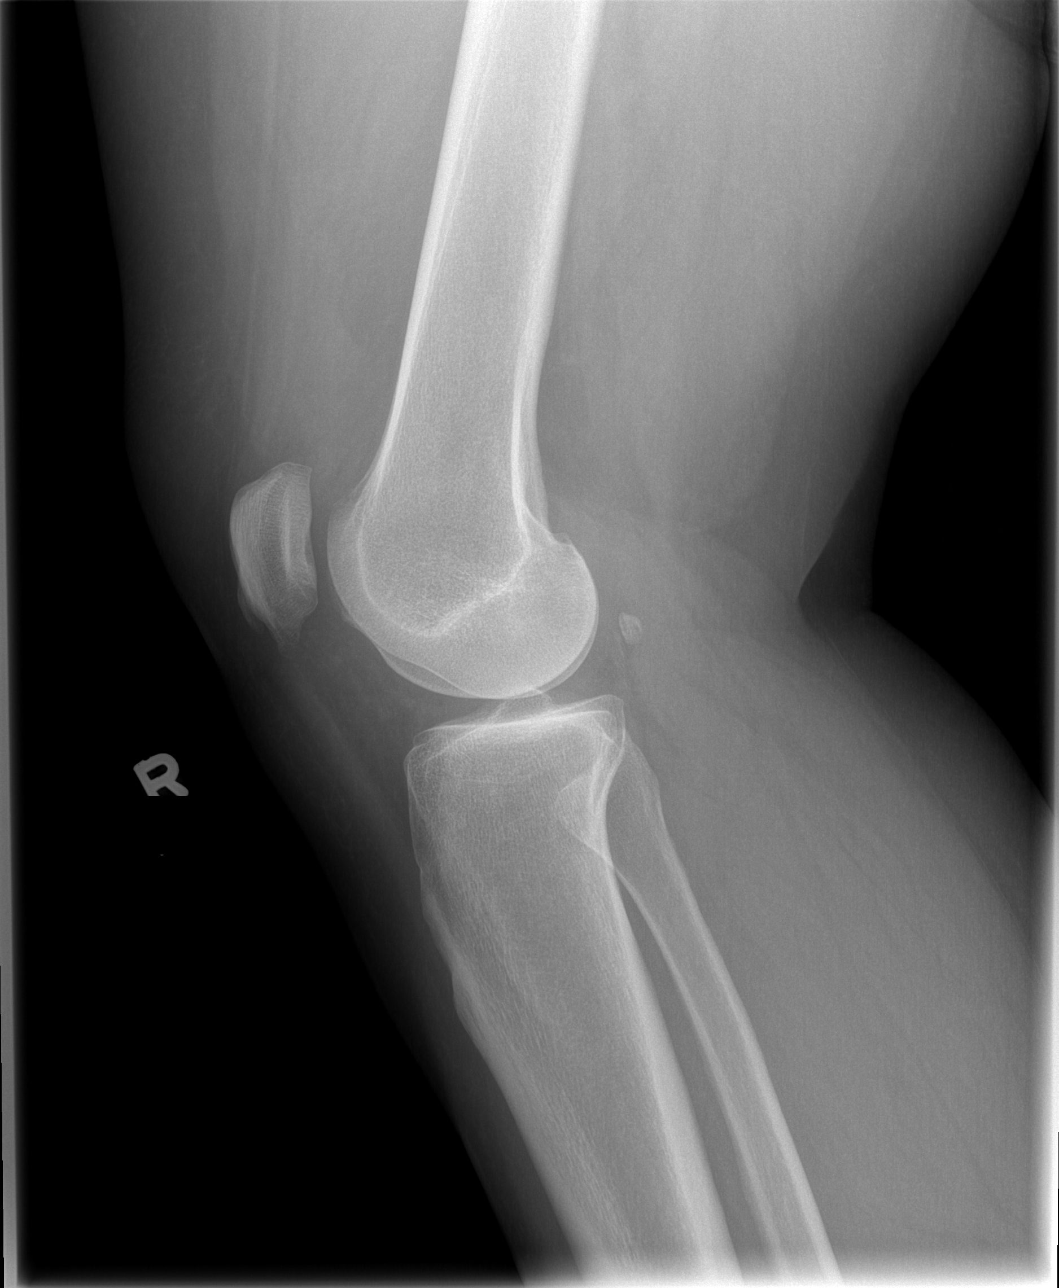

[4 of 4 positions shown; findings below may reference images not displayed]

FINDINGS: No fracture or dislocation noted.  There are mild
degenerative changes.  Possible small joint effusion on the lateral
view.  This is not definite.
IMPRESSION: Cannot exclude small joint effusion, but no fracture or
dislocation.

## 2011-04-24 LAB — URINE CULTURE

## 2011-04-24 LAB — CBC
HCT: 40.3
MCHC: 34.8
MCV: 95.3
Platelets: 348
WBC: 11.7 — ABNORMAL HIGH

## 2011-04-24 LAB — DIFFERENTIAL
Basophils Relative: 1
Eosinophils Absolute: 0.1
Eosinophils Relative: 1
Lymphs Abs: 3.9

## 2011-04-24 LAB — URINALYSIS, ROUTINE W REFLEX MICROSCOPIC
Nitrite: NEGATIVE
Specific Gravity, Urine: 1.025
Urobilinogen, UA: 1
pH: 6

## 2011-04-24 LAB — POCT I-STAT, CHEM 8
BUN: 10
Calcium, Ion: 1.11 — ABNORMAL LOW
Chloride: 105
Creatinine, Ser: 0.9
Sodium: 137

## 2011-04-24 LAB — URINE MICROSCOPIC-ADD ON

## 2011-04-25 LAB — CBC
HCT: 37.6
Hemoglobin: 13.1
MCHC: 34.8
RBC: 3.98

## 2011-04-25 LAB — WET PREP, GENITAL
Trich, Wet Prep: NONE SEEN
Yeast Wet Prep HPF POC: NONE SEEN

## 2011-04-25 LAB — RPR: RPR Ser Ql: NONREACTIVE

## 2011-04-25 LAB — POCT I-STAT, CHEM 8
BUN: 7
Chloride: 105
Sodium: 139

## 2011-04-25 LAB — URINALYSIS, ROUTINE W REFLEX MICROSCOPIC
Nitrite: NEGATIVE
Specific Gravity, Urine: 1.024
Urobilinogen, UA: 0.2

## 2011-04-25 LAB — DIFFERENTIAL
Basophils Relative: 2 — ABNORMAL HIGH
Eosinophils Relative: 2
Monocytes Absolute: 0.7
Monocytes Relative: 7
Neutro Abs: 6.2

## 2011-04-25 LAB — POCT PREGNANCY, URINE
Operator id: 234501
Preg Test, Ur: NEGATIVE

## 2011-04-25 LAB — URINE MICROSCOPIC-ADD ON

## 2011-05-13 LAB — COMPREHENSIVE METABOLIC PANEL
ALT: 12
Albumin: 3.6
Albumin: 4.3
Alkaline Phosphatase: 61
BUN: 5 — ABNORMAL LOW
Chloride: 102
Creatinine, Ser: 0.69
GFR calc Af Amer: >60
GFR calc non Af Amer: >60
Glucose, Bld: 129 — ABNORMAL HIGH
Potassium: 3.9
Sodium: 139
Total Bilirubin: 1
Total Protein: 6.4

## 2011-05-13 LAB — CBC
HCT: 40.8
MCV: 93.5
Platelets: 362
Platelets: 397
RDW: 15.1 — ABNORMAL HIGH
WBC: 11 — ABNORMAL HIGH

## 2011-05-13 LAB — DIFFERENTIAL
Basophils Relative: 0
Eosinophils Absolute: 0.1
Monocytes Absolute: 0.4
Monocytes Relative: 5
Neutro Abs: 5.6

## 2011-05-13 LAB — AMYLASE: Amylase: 72

## 2011-05-14 ENCOUNTER — Emergency Department (HOSPITAL_COMMUNITY)
Admission: EM | Admit: 2011-05-14 | Discharge: 2011-05-14 | Disposition: A | Discharge status: Home / Self Care | Payer: Self-pay | Attending: Emergency Medicine | Admitting: Emergency Medicine

## 2011-05-14 ENCOUNTER — Emergency Department (HOSPITAL_COMMUNITY): Payer: Self-pay

## 2011-05-14 DIAGNOSIS — I10 Essential (primary) hypertension: Secondary | ICD-10-CM | POA: Insufficient documentation

## 2011-05-14 DIAGNOSIS — J3489 Other specified disorders of nose and nasal sinuses: Secondary | ICD-10-CM | POA: Insufficient documentation

## 2011-05-14 DIAGNOSIS — J069 Acute upper respiratory infection, unspecified: Secondary | ICD-10-CM | POA: Insufficient documentation

## 2011-05-14 DIAGNOSIS — R059 Cough, unspecified: Secondary | ICD-10-CM | POA: Insufficient documentation

## 2011-05-14 DIAGNOSIS — F319 Bipolar disorder, unspecified: Secondary | ICD-10-CM | POA: Insufficient documentation

## 2011-05-14 DIAGNOSIS — Z79899 Other long term (current) drug therapy: Secondary | ICD-10-CM | POA: Insufficient documentation

## 2011-05-14 DIAGNOSIS — F1010 Alcohol abuse, uncomplicated: Secondary | ICD-10-CM | POA: Insufficient documentation

## 2011-05-14 DIAGNOSIS — R05 Cough: Secondary | ICD-10-CM | POA: Insufficient documentation

## 2011-05-16 LAB — COMPREHENSIVE METABOLIC PANEL
ALT: <8
AST: 14
Alkaline Phosphatase: 62
CO2: 25
Chloride: 109
GFR calc non Af Amer: >60
Glucose, Bld: 98
Potassium: 3.9
Sodium: 139

## 2011-05-16 LAB — CBC
Hemoglobin: 12.4
MCHC: 33.6
RBC: 3.94
WBC: 8.9

## 2011-05-16 LAB — URINE MICROSCOPIC-ADD ON

## 2011-05-16 LAB — DIFFERENTIAL
Basophils Relative: 1
Eosinophils Absolute: 0.1
Eosinophils Relative: 1
Neutrophils Relative %: 64

## 2011-05-16 LAB — GC/CHLAMYDIA PROBE AMP, GENITAL
Chlamydia, DNA Probe: NEGATIVE
GC Probe Amp, Genital: NEGATIVE

## 2011-05-16 LAB — WET PREP, GENITAL: Trich, Wet Prep: NONE SEEN

## 2011-05-16 LAB — URINALYSIS, ROUTINE W REFLEX MICROSCOPIC
Ketones, ur: NEGATIVE
Protein, ur: NEGATIVE
Urobilinogen, UA: 0.2

## 2011-05-16 LAB — POCT PREGNANCY, URINE: Operator id: 284141

## 2011-11-01 ENCOUNTER — Encounter (HOSPITAL_COMMUNITY): Payer: Self-pay | Admitting: *Deleted

## 2011-11-01 ENCOUNTER — Emergency Department (HOSPITAL_COMMUNITY)
Admission: EM | Admit: 2011-11-01 | Discharge: 2011-11-01 | Disposition: A | Discharge status: Home / Self Care | Payer: Self-pay | Attending: Emergency Medicine | Admitting: Emergency Medicine

## 2011-11-01 DIAGNOSIS — R062 Wheezing: Secondary | ICD-10-CM | POA: Insufficient documentation

## 2011-11-01 DIAGNOSIS — J069 Acute upper respiratory infection, unspecified: Secondary | ICD-10-CM

## 2011-11-01 DIAGNOSIS — F17200 Nicotine dependence, unspecified, uncomplicated: Secondary | ICD-10-CM | POA: Insufficient documentation

## 2011-11-01 HISTORY — DX: Essential (primary) hypertension: I10

## 2011-11-01 MED ORDER — ALBUTEROL SULFATE HFA 108 (90 BASE) MCG/ACT IN AERS
2.0000 | INHALATION_SPRAY | RESPIRATORY_TRACT | Status: DC | PRN
  Administered 2011-11-01: 2 via RESPIRATORY_TRACT
  Filled 2011-11-01: qty 6.7

## 2011-11-01 MED ORDER — HYDROCOD POLST-CHLORPHEN POLST 10-8 MG/5ML PO LQCR: 5.0000 mL | Freq: Two times a day (BID) | ORAL | Status: DC | PRN

## 2011-11-01 MED ORDER — DOXYCYCLINE HYCLATE 100 MG PO CAPS: 100.0000 mg | ORAL_CAPSULE | Freq: Two times a day (BID) | ORAL | Status: AC

## 2011-11-01 MED ORDER — HYDROCHLOROTHIAZIDE 25 MG PO TABS: 25.0000 mg | ORAL_TABLET | Freq: Every day | ORAL | Status: DC

## 2011-11-01 MED ORDER — HYDROCHLOROTHIAZIDE 12.5 MG PO CAPS: 12.5000 mg | ORAL_CAPSULE | Freq: Every day | ORAL | Status: DC

## 2011-11-01 MED ORDER — HYDROCHLOROTHIAZIDE 25 MG PO TABS
12.5000 mg | ORAL_TABLET | ORAL | Status: DC
  Filled 2011-11-01: qty 0.5

## 2011-11-01 MED ORDER — PREDNISONE 10 MG PO TABS: 20.0000 mg | ORAL_TABLET | Freq: Every day | ORAL | Status: DC

## 2011-11-01 NOTE — Discharge Instructions (Signed)
Smoking Cessation, Tips for Success YOU CAN QUIT SMOKING If you are ready to quit smoking, congratulations! You have chosen to help yourself be healthier. Cigarettes bring nicotine, tar, carbon monoxide, and other irritants into your body. Your lungs, heart, and blood vessels will be able to work better without these poisons. There are many different ways to quit smoking. Nicotine gum, nicotine patches, a nicotine inhaler, or nicotine nasal spray can help with physical craving. Hypnosis, support groups, and medicines help break the habit of smoking. Here are some tips to help you quit for good.  Throw away all cigarettes.   Clean and remove all ashtrays from your home, work, and car.   On a card, write down your reasons for quitting. Carry the card with you and read it when you get the urge to smoke.   Cleanse your body of nicotine. Drink enough water and fluids to keep your urine clear or pale yellow. Do this after quitting to flush the nicotine from your body.   Learn to predict your moods. Do not let a bad situation be your excuse to have a cigarette. Some situations in your life might tempt you into wanting a cigarette.   Never have "just one" cigarette. It leads to wanting another and another. Remind yourself of your decision to quit.   Change habits associated with smoking. If you smoked while driving or when feeling stressed, try other activities to replace smoking. Stand up when drinking your coffee. Brush your teeth after eating. Sit in a different chair when you read the paper. Avoid alcohol while trying to quit, and try to drink fewer caffeinated beverages. Alcohol and caffeine may urge you to smoke.   Avoid foods and drinks that can trigger a desire to smoke, such as sugary or spicy foods and alcohol.   Ask people who smoke not to smoke around you.   Have something planned to do right after eating or having a cup of coffee. Take a walk or exercise to perk you up. This will help to  keep you from overeating.   Try a relaxation exercise to calm you down and decrease your stress. Remember, you may be tense and nervous for the first 2 weeks after you quit, but this will pass.   Find new activities to keep your hands busy. Play with a pen, coin, or rubber band. Doodle or draw things on paper.   Brush your teeth right after eating. This will help cut down on the craving for the taste of tobacco after meals. You can try mouthwash, too.   Use oral substitutes, such as lemon drops, carrots, a cinnamon stick, or chewing gum, in place of cigarettes. Keep them handy so they are available when you have the urge to smoke.   When you have the urge to smoke, try deep breathing.   Designate your home as a nonsmoking area.   If you are a heavy smoker, ask your caregiver about a prescription for nicotine chewing gum. It can ease your withdrawal from nicotine.   Reward yourself. Set aside the cigarette money you save and buy yourself something nice.   Look for support from others. Join a support group or smoking cessation program. Ask someone at home or at work to help you with your plan to quit smoking.   Always ask yourself, "Do I need this cigarette or is this just a reflex?" Tell yourself, "Today, I choose not to smoke," or "I do not want to smoke." You are  reminding yourself of your decision to quit, even if you do smoke a cigarette.  HOW WILL I FEEL WHEN I QUIT SMOKING?  The benefits of not smoking start within days of quitting.   You may have symptoms of withdrawal because your body is used to nicotine (the addictive substance in cigarettes). You may crave cigarettes, be irritable, feel very hungry, cough often, get headaches, or have difficulty concentrating.   The withdrawal symptoms are only temporary. They are strongest when you first quit but will go away within 10 to 14 days.   When withdrawal symptoms occur, stay in control. Think about your reasons for quitting. Remind  yourself that these are signs that your body is healing and getting used to being without cigarettes.   Remember that withdrawal symptoms are easier to treat than the major diseases that smoking can cause.   Even after the withdrawal is over, expect periodic urges to smoke. However, these cravings are generally short-lived and will go away whether you smoke or not. Do not smoke!   If you relapse and smoke again, do not lose hope. Most smokers quit 3 times before they are successful.   If you relapse, do not give up! Plan ahead and think about what you will do the next time you get the urge to smoke.  LIFE AS A NONSMOKER: MAKE IT FOR A MONTH, MAKE IT FOR LIFE Day 1: Hang this page where you will see it every day. Day 2: Get rid of all ashtrays, matches, and lighters. Day 3: Drink water. Breathe deeply between sips. Day 4: Avoid places with smoke-filled air, such as bars, clubs, or the smoking section of restaurants. Day 5: Keep track of how much money you save by not smoking. Day 6: Avoid boredom. Keep a good book with you or go to the movies. Day 7: Reward yourself! One week without smoking! Day 8: Make a dental appointment to get your teeth cleaned. Day 9: Decide how you will turn down a cigarette before it is offered to you. Day 10: Review your reasons for quitting. Day 11: Distract yourself. Stay active to keep your mind off smoking and to relieve tension. Take a walk, exercise, read a book, do a crossword puzzle, or try a new hobby. Day 12: Exercise. Get off the bus before your stop or use stairs instead of escalators. Day 13: Call on friends for support and encouragement. Day 14: Reward yourself! Two weeks without smoking! Day 15: Practice deep breathing exercises. Day 16: Bet a friend that you can stay a nonsmoker. Day 17: Ask to sit in nonsmoking sections of restaurants. Day 18: Hang up "No Smoking" signs. Day 19: Think of yourself as a nonsmoker. Day 20: Each morning, tell  yourself you will not smoke. Day 21: Reward yourself! Three weeks without smoking! Day 22: Think of smoking in negative ways. Remember how it stains your teeth, gives you bad breath, and leaves you short of breath. Day 23: Eat a nutritious breakfast. Day 24:Do not relive your days as a smoker. Day 25: Hold a pencil in your hand when talking on the telephone. Day 26: Tell all your friends you do not smoke. Day 27: Think about how much better food tastes. Day 28: Remember, one cigarette is one too many. Day 29: Take up a hobby that will keep your hands busy. Day 30: Congratulations! One month without smoking! Give yourself a big reward. Your caregiver can direct you to community resources or hospitals for support, which  may include:  Group support.   Education.   Hypnosis.   Subliminal therapy.  Document Released: 04/12/2004 Document Revised: 07/04/2011 Document Reviewed: 05/01/2009 [facility] Patient Information 2012 [place], [place].Hypertension As your heart beats, it forces blood through your arteries. This force is your blood pressure. If the pressure is too high, it is called hypertension (HTN) or high blood pressure. HTN is dangerous because you may have it and not know it. High blood pressure may mean that your heart has to work harder to pump blood. Your arteries may be narrow or stiff. The extra work puts you at risk for heart disease, stroke, and other problems.  Blood pressure consists of two numbers, a higher number over a lower, 110/72, for example. It is stated as "110 over 72." The ideal is below 120 for the top number (systolic) and under 80 for the bottom (diastolic). Write down your blood pressure today. You should pay close attention to your blood pressure if you have certain conditions such as:  Heart failure.   Prior heart attack.   Diabetes   Chronic kidney disease.   Prior stroke.   Multiple risk factors for heart disease.  To see if you have HTN, your blood  pressure should be measured while you are seated with your arm held at the level of the heart. It should be measured at least twice. A one-time elevated blood pressure reading (especially in the Emergency Department) does not mean that you need treatment. There may be conditions in which the blood pressure is different between your right and left arms. It is important to see your caregiver soon for a recheck. Most people have essential hypertension which means that there is not a specific cause. This type of high blood pressure may be lowered by changing lifestyle factors such as:  Stress.   Smoking.   Lack of exercise.   Excessive weight.   Drug/tobacco/alcohol use.   Eating less salt.  Most people do not have symptoms from high blood pressure until it has caused damage to the body. Effective treatment can often prevent, delay or reduce that damage. TREATMENT  When a cause has been identified, treatment for high blood pressure is directed at the cause. There are a large number of medications to treat HTN. These fall into several categories, and your caregiver will help you select the medicines that are best for you. Medications may have side effects. You should review side effects with your caregiver. If your blood pressure stays high after you have made lifestyle changes or started on medicines,   Your medication(s) may need to be changed.   Other problems may need to be addressed.   Be certain you understand your prescriptions, and know how and when to take your medicine.   Be sure to follow up with your caregiver within the time frame advised (usually within two weeks) to have your blood pressure rechecked and to review your medications.   If you are taking more than one medicine to lower your blood pressure, make sure you know how and at what times they should be taken. Taking two medicines at the same time can result in blood pressure that is too low.  SEEK IMMEDIATE MEDICAL CARE  IF:  You develop a severe headache, blurred or changing vision, or confusion.   You have unusual weakness or numbness, or a faint feeling.   You have severe chest or abdominal pain, vomiting, or breathing problems.  MAKE SURE YOU:   Understand these instructions.  Will watch your condition.   Will get help right away if you are not doing well or get worse.  Document Released: 07/15/2005 Document Revised: 07/04/2011 Document Reviewed: 03/04/2008 [facility] Patient Information 2012 [place], [place].Bronchitis Bronchitis is the body's way of reacting to injury and/or infection (inflammation) of the bronchi. Bronchi are the air tubes that extend from the windpipe into the lungs. If the inflammation becomes severe, it may cause shortness of breath. CAUSES  Inflammation may be caused by:  A virus.   Germs (bacteria).   Dust.   Allergens.   Pollutants and many other irritants.  The cells lining the bronchial tree are covered with tiny hairs (cilia). These constantly beat upward, away from the lungs, toward the mouth. This keeps the lungs free of pollutants. When these cells become too irritated and are unable to do their job, mucus begins to develop. This causes the characteristic cough of bronchitis. The cough clears the lungs when the cilia are unable to do their job. Without either of these protective mechanisms, the mucus would settle in the lungs. Then you would develop pneumonia. Smoking is a common cause of bronchitis and can contribute to pneumonia. Stopping this habit is the single most important thing you can do to help yourself. TREATMENT   Your caregiver may prescribe an antibiotic if the cough is caused by bacteria. Also, medicines that open up your airways make it easier to breathe. Your caregiver may also recommend or prescribe an expectorant. It will loosen the mucus to be coughed up. Only take over-the-counter or prescription medicines for pain, discomfort, or fever as  directed by your caregiver.   Removing whatever causes the problem (smoking, for example) is critical to preventing the problem from getting worse.   Cough suppressants may be prescribed for relief of cough symptoms.   Inhaled medicines may be prescribed to help with symptoms now and to help prevent problems from returning.   For those with recurrent (chronic) bronchitis, there may be a need for steroid medicines.  SEEK IMMEDIATE MEDICAL CARE IF:   During treatment, you develop more pus-like mucus (purulent sputum).   You have a fever.   Your baby is older than 3 months with a rectal temperature of 102 F (38.9 C) or higher.   Your baby is 30 months old or younger with a rectal temperature of 100.4 F (38 C) or higher.   You become progressively more ill.   You have increased difficulty breathing, wheezing, or shortness of breath.  It is necessary to seek immediate medical care if you are elderly or sick from any other disease. MAKE SURE YOU:   Understand these instructions.   Will watch your condition.   Will get help right away if you are not doing well or get worse.  Document Released: 07/15/2005 Document Revised: 07/04/2011 Document Reviewed: 05/24/2008 [facility] Patient Information 2012 [place], [place].

## 2011-11-01 NOTE — ED Provider Notes (Signed)
History     CSN: [id number]  Arrival date & time 11/01/11  1629   First MD Initiated Contact with Patient 11/01/11 1754      Chief Complaint  Patient presents with  . Nasal Congestion    (Consider location/radiation/quality/duration/timing/severity/associated sxs/prior treatment) HPI  Cough and nasal congestion with left ear pain for three days.  Cough is nonproductive and no fever.  Patient is sob and wheezing.  Patient is smoker and has smoked until today.  History of wheezing with uri.    Past Medical History  Diagnosis Date  . Hypertension     History reviewed. No pertinent past surgical history.  History reviewed. No pertinent family history.  History  Substance Use Topics  . Smoking status: Current Everyday Smoker    Types: Cigarettes  . Smokeless tobacco: Not on file  . Alcohol Use: No    OB History    Grav Para Term Preterm Abortions TAB SAB Ect Mult Living                  Review of Systems  All other systems reviewed and are negative.    Allergies  Review of patient's allergies indicates no known allergies.  Home Medications   Current Outpatient Rx  Name Route Sig Dispense Refill  . ACETAMINOPHEN 500 MG PO TABS Oral Take 1,000 mg by mouth every 6 (six) hours as needed. For pain    . QUETIAPINE FUMARATE 300 MG PO TABS Oral Take 300 mg by mouth at bedtime.      BP 146/93  Pulse 90  Temp(Src) 97.9 F (36.6 C) (Oral)  Resp 16  SpO2 98%  Physical Exam  Nursing note and vitals reviewed. Constitutional: She appears well-developed and well-nourished.  HENT:  Head: Normocephalic and atraumatic.  Eyes: Conjunctivae and EOM are normal. Pupils are equal, round, and reactive to light.  Neck: Normal range of motion. Neck supple.  Cardiovascular: Normal rate, regular rhythm, normal heart sounds and intact distal pulses.   Pulmonary/Chest: Effort normal and breath sounds normal.       Some rhonchi in bilateral bases and few expiratory wheezes     Abdominal: Soft. Bowel sounds are normal.  Musculoskeletal: Normal range of motion.  Neurological: She is alert.  Skin: Skin is warm and dry.  Psychiatric: She has a normal mood and affect. Thought content normal.    ED Course  Procedures (including critical care time)  Labs Reviewed - No data to display No results found.   No diagnosis found.    MDM  Patient was some wheezing. She'll be given a prescription for prednisone, doxycycline, and Tussionex, and hydrochlorothiazide. She has not been taking her hydrochlorothiazide because she has lost her insurance is unable see her primary care doctor. She is advised to [facility] so that she can continue on her antihypertensives. She was given an albuterol MDI here       Hilario Quarry, MD 11/05/11 1758

## 2011-11-01 NOTE — ED Notes (Signed)
To ed for eval of head congestion and coughing for past week.

## 2011-12-30 ENCOUNTER — Emergency Department (HOSPITAL_COMMUNITY)
Admission: EM | Admit: 2011-12-30 | Discharge: 2011-12-30 | Disposition: A | Discharge status: Home / Self Care | Payer: Self-pay | Attending: Emergency Medicine | Admitting: Emergency Medicine

## 2011-12-30 ENCOUNTER — Encounter (HOSPITAL_COMMUNITY): Payer: Self-pay | Admitting: *Deleted

## 2011-12-30 DIAGNOSIS — F319 Bipolar disorder, unspecified: Secondary | ICD-10-CM | POA: Insufficient documentation

## 2011-12-30 DIAGNOSIS — Z760 Encounter for issue of repeat prescription: Secondary | ICD-10-CM

## 2011-12-30 DIAGNOSIS — F419 Anxiety disorder, unspecified: Secondary | ICD-10-CM

## 2011-12-30 DIAGNOSIS — F4310 Post-traumatic stress disorder, unspecified: Secondary | ICD-10-CM | POA: Insufficient documentation

## 2011-12-30 DIAGNOSIS — F411 Generalized anxiety disorder: Secondary | ICD-10-CM | POA: Insufficient documentation

## 2011-12-30 DIAGNOSIS — F17200 Nicotine dependence, unspecified, uncomplicated: Secondary | ICD-10-CM | POA: Insufficient documentation

## 2011-12-30 DIAGNOSIS — I10 Essential (primary) hypertension: Secondary | ICD-10-CM | POA: Insufficient documentation

## 2011-12-30 HISTORY — DX: Bipolar disorder, unspecified: F31.9

## 2011-12-30 HISTORY — DX: Anxiety disorder, unspecified: F41.9

## 2011-12-30 HISTORY — DX: Post-traumatic stress disorder, unspecified: F43.10

## 2011-12-30 MED ORDER — HYDROCHLOROTHIAZIDE 25 MG PO TABS: 25.0000 mg | ORAL_TABLET | Freq: Every day | ORAL | Status: DC

## 2011-12-30 MED ORDER — ALPRAZOLAM 1 MG PO TABS: 1.0000 mg | ORAL_TABLET | Freq: Three times a day (TID) | ORAL | Status: DC

## 2011-12-30 MED ORDER — METOPROLOL TARTRATE 50 MG PO TABS: 50.0000 mg | ORAL_TABLET | Freq: Every day | ORAL | Status: DC

## 2011-12-30 MED ORDER — QUETIAPINE FUMARATE 300 MG PO TABS: 300.0000 mg | ORAL_TABLET | Freq: Every day | ORAL | Status: DC

## 2011-12-30 NOTE — ED Provider Notes (Signed)
History     CSN: [id number]  Arrival date & time 12/30/11  7829   First MD Initiated Contact with Patient 12/30/11 0848      9:23 AM HPI Patient reports she has been unable to see her physician to to lack of money. Reports she will not be able to see him until next month. Reports she has been out of her medications for about one month. States she is here now because she's had increasing headaches in addition to her high blood pressure. Also reports increased anxiety with the lack of her Xanax and Seroquel. Denies chest pain, shortness of breath, numbness, tingling, weakness, difficulty with speech or ambulating. States she's is here for medication refill. Reports she has been attempting to control her blood pressure with diet and exercise. States she is decreased salt and fatty foods from her diet.  Patient is a 46 y.o. female presenting with hypertension.  Hypertension This is a chronic problem. The problem has been gradually worsening. Associated symptoms include headaches. Pertinent negatives include no abdominal pain, chest pain, coughing, diaphoresis, fatigue, nausea, neck pain, numbness, visual change, vomiting or weakness.    Past Medical History  Diagnosis Date  . Hypertension   . Anxiety   . Bipolar 1 disorder   . Post-traumatic stress syndrome     History reviewed. No pertinent past surgical history.  No family history on file.  History  Substance Use Topics  . Smoking status: Current Everyday Smoker    Types: Cigarettes  . Smokeless tobacco: Not on file  . Alcohol Use: No    OB History    Grav Para Term Preterm Abortions TAB SAB Ect Mult Living                  Review of Systems  Constitutional: Negative for diaphoresis and fatigue.  HENT: Negative for neck pain.   Eyes: Negative for photophobia, pain and visual disturbance.  Respiratory: Negative for cough.   Cardiovascular: Negative for chest pain.  Gastrointestinal: Negative for nausea, vomiting and  abdominal pain.  Neurological: Positive for headaches. Negative for weakness and numbness.  All other systems reviewed and are negative.    Allergies  Review of patient's allergies indicates no known allergies.  Home Medications   Current Outpatient Rx  Name Route Sig Dispense Refill  . ALPRAZOLAM 1 MG PO TABS Oral Take 1 mg by mouth 3 (three) times daily.    Marland Kitchen VITAMIN D PO Oral Take 1 tablet by mouth daily.    Marland Kitchen HYDROCHLOROTHIAZIDE 25 MG PO TABS Oral Take 25 mg by mouth daily.    Marland Kitchen METOPROLOL TARTRATE 50 MG PO TABS Oral Take 50 mg by mouth daily.    . QUETIAPINE FUMARATE 300 MG PO TABS Oral Take 300 mg by mouth at bedtime.      BP 150/99  Pulse 103  Temp(Src) 98.8 F (37.1 C) (Oral)  Resp 18  SpO2 100%  Physical Exam  Vitals reviewed. Constitutional: She is oriented to person, place, and time. Vital signs are normal. She appears well-developed and well-nourished. No distress.  HENT:  Head: Normocephalic and atraumatic.  Mouth/Throat: Oropharynx is clear and moist. No oropharyngeal exudate.  Eyes: Conjunctivae and EOM are normal. Pupils are equal, round, and reactive to light. Right eye exhibits no discharge. Left eye exhibits no discharge.  Neck: Neck supple.  Cardiovascular: Normal rate, regular rhythm and normal heart sounds.  Exam reveals no friction rub.   No murmur heard. Pulmonary/Chest: Effort normal and breath  sounds normal. She has no wheezes. She has no rales. She exhibits no tenderness.  Abdominal: Soft. Bowel sounds are normal.  Neurological: She is alert and oriented to person, place, and time. No cranial nerve deficit (Tested CN III-XII). Gait normal.  Skin: Skin is warm and dry. No rash noted. No erythema. No pallor.  Psychiatric: She has a normal mood and affect. Her behavior is normal.    ED Course  Procedures   MDM    Will Provide with Rx for med. Advised Xanax will be a short Rx. Patient voices understanding and is ready for  d/c      Thomasene Lot, PA-C 12/30/11 1914

## 2011-12-30 NOTE — Discharge Instructions (Signed)
Medication Refill, Emergency Department  We have refilled your medication today as a courtesy to you. It is best for your medical care, however, to take care of getting refills done through your primary caregiver's office. They have your records and can do a better job of follow-up than we can in the emergency department.  On maintenance medications, we often only prescribe enough medications to get you by until you are able to see your regular caregiver. This is a more expensive way to refill medications.  In the future, please plan for refills so that you will not have to use the emergency department for this.  Thank you for your help. Your help allows us to better take care of the daily emergencies that enter our department.  Document Released: 11/01/2003 Document Revised: 07/04/2011 Document Reviewed: 07/15/2005  [employer] Patient Information 2012 [employer].

## 2011-12-30 NOTE — ED Notes (Signed)
Pt has been out of medications for 2 months and is scheduled to see doctor next month

## 2011-12-30 NOTE — ED Provider Notes (Signed)
Medical screening examination/treatment/procedure(s) were performed by non-physician practitioner and as supervising physician I was immediately available for consultation/collaboration.  Gerhard Munch, MD 12/30/11 [phone]

## 2012-02-09 ENCOUNTER — Emergency Department (HOSPITAL_COMMUNITY)
Admission: EM | Admit: 2012-02-09 | Discharge: 2012-02-09 | Disposition: A | Discharge status: Home / Self Care | Payer: Self-pay | Attending: Emergency Medicine | Admitting: Emergency Medicine

## 2012-02-09 ENCOUNTER — Encounter (HOSPITAL_COMMUNITY): Payer: Self-pay | Admitting: Emergency Medicine

## 2012-02-09 DIAGNOSIS — Z760 Encounter for issue of repeat prescription: Secondary | ICD-10-CM | POA: Insufficient documentation

## 2012-02-09 DIAGNOSIS — F411 Generalized anxiety disorder: Secondary | ICD-10-CM | POA: Insufficient documentation

## 2012-02-09 DIAGNOSIS — F4310 Post-traumatic stress disorder, unspecified: Secondary | ICD-10-CM | POA: Insufficient documentation

## 2012-02-09 DIAGNOSIS — F17200 Nicotine dependence, unspecified, uncomplicated: Secondary | ICD-10-CM | POA: Insufficient documentation

## 2012-02-09 DIAGNOSIS — I10 Essential (primary) hypertension: Secondary | ICD-10-CM | POA: Insufficient documentation

## 2012-02-09 DIAGNOSIS — F319 Bipolar disorder, unspecified: Secondary | ICD-10-CM | POA: Insufficient documentation

## 2012-02-09 MED ORDER — ALPRAZOLAM 1 MG PO TABS: 1.0000 mg | ORAL_TABLET | Freq: Three times a day (TID) | ORAL | Status: AC | PRN

## 2012-02-09 MED ORDER — QUETIAPINE FUMARATE 300 MG PO TABS: 300.0000 mg | ORAL_TABLET | Freq: Every day | ORAL | Status: DC

## 2012-02-09 MED ORDER — HYDROCHLOROTHIAZIDE 25 MG PO TABS: 25.0000 mg | ORAL_TABLET | Freq: Every day | ORAL | Status: DC

## 2012-02-09 MED ORDER — METOPROLOL SUCCINATE ER 25 MG PO TB24: 50.0000 mg | ORAL_TABLET | Freq: Every day | ORAL | Status: DC

## 2012-02-09 NOTE — ED Notes (Signed)
Pt. Stated, I've been out of med for 2 weeks and now I'm feeling dizzy

## 2012-02-09 NOTE — ED Provider Notes (Signed)
History  Scribed for Ethelda Chick, MD, the patient was seen in room TR10C/TR10C. This chart was scribed by Candelaria Stagers. The patient's care started at 2:29 PM   CSN: [id number]  Arrival date & time 02/09/12  1338   First MD Initiated Contact with Patient 02/09/12 1406      Chief Complaint  Patient presents with  . Medication Refill    The history is provided by the patient.   Jill [name] is a 46 y.o. female who presents to the Emergency Department complaining of needing a medication refill.  Pt reports that she has been out of her medications for three weeks and is now becoming dizzy.  She is out of Xanax, Seroquel, Toprol, and Hydrodiuril.  Her PCP is Dr. Tenny Craw.   She denies any chest pain or shortness of breath.  There are no other associated systemic symptoms, there are no other alleviating or modifying factors.  She has been not able to get medications due to no insurance- but has recently started working and will be able to arrange for followup with her PMD at that time.  Past Medical History  Diagnosis Date  . Hypertension   . Anxiety   . Bipolar 1 disorder   . Post-traumatic stress syndrome     History reviewed. No pertinent past surgical history.  No family history on file.  History  Substance Use Topics  . Smoking status: Current Everyday Smoker    Types: Cigarettes  . Smokeless tobacco: Not on file  . Alcohol Use: No    OB History    Grav Para Term Preterm Abortions TAB SAB Ect Mult Living                  Review of Systems  Neurological: Positive for dizziness.  All other systems reviewed and are negative.    Allergies  Review of patient's allergies indicates no known allergies.  Home Medications   Current Outpatient Rx  Name Route Sig Dispense Refill  . ALPRAZOLAM 1 MG PO TABS Oral Take 1 tablet (1 mg total) by mouth 3 (three) times daily. 9 tablet 0  . VITAMIN D PO Oral Take 1 tablet by mouth daily.    Marland Kitchen HYDROCHLOROTHIAZIDE 25 MG PO TABS  Oral Take 1 tablet (25 mg total) by mouth daily. 30 tablet 0  . METOPROLOL SUCCINATE ER 50 MG PO TB24 Oral Take 50 mg by mouth daily. Take with or immediately following a meal.    . QUETIAPINE FUMARATE 300 MG PO TABS Oral Take 1 tablet (300 mg total) by mouth at bedtime. 30 tablet 0  . ALPRAZOLAM 1 MG PO TABS Oral Take 1 tablet (1 mg total) by mouth 3 (three) times daily as needed for sleep. 20 tablet 0  . HYDROCHLOROTHIAZIDE 25 MG PO TABS Oral Take 1 tablet (25 mg total) by mouth daily. 30 tablet 0  . METOPROLOL SUCCINATE ER 25 MG PO TB24 Oral Take 2 tablets (50 mg total) by mouth daily. 30 tablet 0  . QUETIAPINE FUMARATE 300 MG PO TABS Oral Take 1 tablet (300 mg total) by mouth at bedtime. 30 tablet 0    BP 141/98  Pulse 91  Temp 98.9 F (37.2 C) (Oral)  Resp 16  SpO2 100%  Physical Exam  Nursing note and vitals reviewed. Constitutional: She is oriented to person, place, and time. She appears well-developed and well-nourished. No distress.  HENT:  Head: Normocephalic and atraumatic.  Eyes: Conjunctivae are normal.  Neck: Normal  range of motion.  Cardiovascular: Normal rate and regular rhythm.   Pulmonary/Chest: Effort normal.  Musculoskeletal: Normal range of motion. She exhibits no tenderness.  Neurological: She is alert and oriented to person, place, and time.  Skin: Skin is warm and dry. She is not diaphoretic.  Psychiatric: She has a normal mood and affect. Her behavior is normal.  Lungs- CTA bilaterally  ED Course  Procedures   DIAGNOSTIC STUDIES: Oxygen Saturation is 100% on room air, normal by my interpretation.    COORDINATION OF CARE:     2:16 PM went to see patient, not yet in room  Labs Reviewed - No data to display No results found.   1. Medication refill       MDM  Pt here requesting medication refills, due to having no insurance.  She is essentially asymptomatic.  Exam is benign.  Rx provided.  She was encouraged to arrange followup with her PMD.   Discharged with strict return precautions.  Pt agreeable with plan.   I personally performed the services described in this documentation, which was scribed in my presence. The recorded information has been reviewed and considered.         Ethelda Chick, MD 02/09/12 1438

## 2012-03-26 ENCOUNTER — Emergency Department (HOSPITAL_COMMUNITY)
Admission: EM | Admit: 2012-03-26 | Discharge: 2012-03-26 | Disposition: A | Discharge status: Home / Self Care | Payer: Self-pay | Attending: Emergency Medicine | Admitting: Emergency Medicine

## 2012-03-26 ENCOUNTER — Encounter (HOSPITAL_COMMUNITY): Payer: Self-pay | Admitting: *Deleted

## 2012-03-26 DIAGNOSIS — F319 Bipolar disorder, unspecified: Secondary | ICD-10-CM | POA: Insufficient documentation

## 2012-03-26 DIAGNOSIS — G44209 Tension-type headache, unspecified, not intractable: Secondary | ICD-10-CM | POA: Insufficient documentation

## 2012-03-26 DIAGNOSIS — F17200 Nicotine dependence, unspecified, uncomplicated: Secondary | ICD-10-CM | POA: Insufficient documentation

## 2012-03-26 DIAGNOSIS — I10 Essential (primary) hypertension: Secondary | ICD-10-CM | POA: Insufficient documentation

## 2012-03-26 DIAGNOSIS — F4310 Post-traumatic stress disorder, unspecified: Secondary | ICD-10-CM | POA: Insufficient documentation

## 2012-03-26 DIAGNOSIS — Z760 Encounter for issue of repeat prescription: Secondary | ICD-10-CM | POA: Insufficient documentation

## 2012-03-26 DIAGNOSIS — F411 Generalized anxiety disorder: Secondary | ICD-10-CM | POA: Insufficient documentation

## 2012-03-26 LAB — URINE MICROSCOPIC-ADD ON

## 2012-03-26 LAB — URINALYSIS, ROUTINE W REFLEX MICROSCOPIC
Nitrite: NEGATIVE
Protein, ur: 30 mg/dL — AB
Specific Gravity, Urine: 1.03 (ref 1.005–1.030)
Urobilinogen, UA: 1 mg/dL (ref 0.0–1.0)

## 2012-03-26 MED ORDER — METOPROLOL SUCCINATE ER 25 MG PO TB24: 25.0000 mg | ORAL_TABLET | Freq: Every day | ORAL | Status: DC

## 2012-03-26 MED ORDER — HYDROCHLOROTHIAZIDE 25 MG PO TABS: 25.0000 mg | ORAL_TABLET | Freq: Every day | ORAL | Status: DC

## 2012-03-26 MED ORDER — QUETIAPINE FUMARATE 300 MG PO TABS: 300.0000 mg | ORAL_TABLET | Freq: Every day | ORAL | Status: DC

## 2012-03-26 MED ORDER — IBUPROFEN 400 MG PO TABS
800.0000 mg | ORAL_TABLET | Freq: Once | ORAL | Status: AC
  Administered 2012-03-26: 800 mg via ORAL
  Filled 2012-03-26: qty 2

## 2012-03-26 MED ORDER — ALPRAZOLAM 1 MG PO TABS: 1.0000 mg | ORAL_TABLET | Freq: Every evening | ORAL | Status: AC | PRN

## 2012-03-26 NOTE — ED Notes (Addendum)
Pt reports she has been out of her blood pressure medications for 3 weeks. Reports for last 3 day she has had frontal headache with no associated symptoms. Denies fever, chills, abdominal pain, or urinary symtposm.

## 2012-03-26 NOTE — ED Notes (Signed)
Pt has been out of work and unable to see pcp.  Pt needs scripts for following meds until Sep 30th when she can see PCP.  Metoprolol 100 once a day HCTZ 25mg  once a day Seroquel 300mg  once a day Xanax 1mg  PRN

## 2012-03-26 NOTE — ED Notes (Signed)
Provider at bedside

## 2012-03-26 NOTE — ED Provider Notes (Signed)
History     CSN: [id number]  Arrival date & time 03/26/12  1633   First MD Initiated Contact with Patient 03/26/12 2005      Chief Complaint  Patient presents with  . Headache   HPI  History provided by the patient. Patient is a 46 year old female with history of hypertension, anxiety and bipolar disorder who presents with concerns for her headache and possibly elevated blood pressure. Patient states that she has run out of her normal medications. Patient was seen in emergency room one month ago for refills of medications. Patient states she is currently without medical insurance and cannot followup with PCP. Patient describes a mild to moderate headache is primary in the posterior aspect and along the neck. Patient states it may be a stress headache. She has no other complaints. She denies any chest pain shortness of breath. Denies any urinary changes. Denies any fever.    Past Medical History  Diagnosis Date  . Hypertension   . Anxiety   . Bipolar 1 disorder   . Post-traumatic stress syndrome     History reviewed. No pertinent past surgical history.  History reviewed. No pertinent family history.  History  Substance Use Topics  . Smoking status: Current Everyday Smoker    Types: Cigarettes  . Smokeless tobacco: Not on file  . Alcohol Use: No    OB History    Grav Para Term Preterm Abortions TAB SAB Ect Mult Living                  Review of Systems  Constitutional: Negative for fever, diaphoresis, appetite change and unexpected weight change.  Cardiovascular: Negative for chest pain and palpitations.  Gastrointestinal: Negative for nausea and vomiting.  Neurological: Positive for headaches. Negative for weakness and numbness.    Allergies  Review of patient's allergies indicates no known allergies.  Home Medications   Current Outpatient Rx  Name Route Sig Dispense Refill  . ALPRAZOLAM 1 MG PO TABS Oral Take 1 tablet (1 mg total) by mouth 3 (three) times  daily. 9 tablet 0  . VITAMIN D PO Oral Take 1 tablet by mouth daily.    Marland Kitchen HYDROCHLOROTHIAZIDE 25 MG PO TABS Oral Take 1 tablet (25 mg total) by mouth daily. 30 tablet 0  . METOPROLOL SUCCINATE ER 25 MG PO TB24 Oral Take 2 tablets (50 mg total) by mouth daily. 30 tablet 0  . QUETIAPINE FUMARATE 300 MG PO TABS Oral Take 1 tablet (300 mg total) by mouth at bedtime. 30 tablet 0    BP 124/90  Pulse 110  Temp 99.3 F (37.4 C) (Oral)  Resp 17  SpO2 100%  Physical Exam  Nursing note and vitals reviewed. Constitutional: She is oriented to person, place, and time. She appears well-developed and well-nourished. No distress.  HENT:  Head: Normocephalic.  Eyes: Conjunctivae and EOM are normal. Pupils are equal, round, and reactive to light.  Neck: Normal range of motion. Neck supple.       Mild tenderness over bilateral trapezius and base of skull. No cervical midline tenderness  Cardiovascular: Normal rate and regular rhythm.   No murmur heard. Pulmonary/Chest: Effort normal and breath sounds normal. No respiratory distress. She has no wheezes. She has no rales.  Abdominal: Soft.  Neurological: She is alert and oriented to person, place, and time. She has normal strength. No sensory deficit. Gait normal.  Skin: Skin is warm and dry. No rash noted. No erythema.  Psychiatric: She has a normal  mood and affect. Her behavior is normal.    ED Course  Procedures   Results for orders placed during the hospital encounter of 03/26/12  URINALYSIS, ROUTINE W REFLEX MICROSCOPIC      Component Value Range   Color, Urine YELLOW  YELLOW   APPearance HAZY (*) CLEAR   Specific Gravity, Urine 1.030  1.005 - 1.030   pH 5.5  5.0 - 8.0   Glucose, UA NEGATIVE  NEGATIVE mg/dL   Hgb urine dipstick SMALL (*) NEGATIVE   Bilirubin Urine SMALL (*) NEGATIVE   Ketones, ur 15 (*) NEGATIVE mg/dL   Protein, ur 30 (*) NEGATIVE mg/dL   Urobilinogen, UA 1.0  0.0 - 1.0 mg/dL   Nitrite NEGATIVE  NEGATIVE   Leukocytes,  UA SMALL (*) NEGATIVE  URINE MICROSCOPIC-ADD ON      Component Value Range   Squamous Epithelial / LPF MANY (*) RARE   WBC, UA 3-6  <3 WBC/hpf   RBC / HPF 3-6  <3 RBC/hpf   Bacteria, UA FEW (*) RARE   Urine-Other MUCOUS PRESENT         1. Tension headache   2. Medication refill       MDM  Patient seen and evaluated. Patient reports mild to moderate headache. Headache is not the worst of life. Headache primarily posterior area and along the neck or trapezius. Patient also complains of need for prescription refill. She was seen last month for similar. Patient currently uninsured and is not able to afford to followup with PCP. Patient does state she is starting a job mouth have medical insurance to followup with PCP in the next month or 2.  Slight tachycardia initially this improved. Have written refills for patient's normal blood pressure medications, Seroquel and small amount of Xanax.      Angus Seller, PA 03/26/12 2350

## 2012-03-27 NOTE — ED Provider Notes (Signed)
Medical screening examination/treatment/procedure(s) were performed by non-physician practitioner and as supervising physician I was immediately available for consultation/collaboration.  John-Adam Julus Kelley, M.D.     John-Adam Latajah Thuman, MD 03/27/12 0039 

## 2012-05-13 ENCOUNTER — Encounter (HOSPITAL_COMMUNITY): Payer: Self-pay | Admitting: Family Medicine

## 2012-05-13 ENCOUNTER — Emergency Department (HOSPITAL_COMMUNITY)
Admission: EM | Admit: 2012-05-13 | Discharge: 2012-05-13 | Disposition: A | Discharge status: Home / Self Care | Payer: Self-pay | Attending: Emergency Medicine | Admitting: Emergency Medicine

## 2012-05-13 DIAGNOSIS — I10 Essential (primary) hypertension: Secondary | ICD-10-CM | POA: Insufficient documentation

## 2012-05-13 DIAGNOSIS — R51 Headache: Secondary | ICD-10-CM | POA: Insufficient documentation

## 2012-05-13 DIAGNOSIS — F4310 Post-traumatic stress disorder, unspecified: Secondary | ICD-10-CM | POA: Insufficient documentation

## 2012-05-13 DIAGNOSIS — F319 Bipolar disorder, unspecified: Secondary | ICD-10-CM | POA: Insufficient documentation

## 2012-05-13 MED ORDER — METOPROLOL TARTRATE 25 MG PO TABS
50.0000 mg | ORAL_TABLET | Freq: Once | ORAL | Status: AC
  Administered 2012-05-13: 50 mg via ORAL
  Filled 2012-05-13: qty 2

## 2012-05-13 MED ORDER — METOPROLOL SUCCINATE ER 25 MG PO TB24: 50.0000 mg | ORAL_TABLET | Freq: Every day | ORAL | Status: DC

## 2012-05-13 MED ORDER — ALPRAZOLAM 1 MG PO TABS: 1.0000 mg | ORAL_TABLET | Freq: Every evening | ORAL | Status: DC | PRN

## 2012-05-13 MED ORDER — HYDROCHLOROTHIAZIDE 25 MG PO TABS: 25.0000 mg | ORAL_TABLET | Freq: Every day | ORAL | Status: DC

## 2012-05-13 MED ORDER — QUETIAPINE FUMARATE 300 MG PO TABS: 300.0000 mg | ORAL_TABLET | Freq: Every day | ORAL | Status: DC

## 2012-05-13 MED ORDER — HYDROCHLOROTHIAZIDE 25 MG PO TABS
25.0000 mg | ORAL_TABLET | Freq: Once | ORAL | Status: AC
  Administered 2012-05-13: 25 mg via ORAL
  Filled 2012-05-13: qty 1

## 2012-05-13 NOTE — ED Provider Notes (Signed)
History     CSN: [id number]  Arrival date & time 05/13/12  5621   First MD Initiated Contact with Patient 05/13/12 [phone]      Chief Complaint  Patient presents with  . Headache    (Consider location/radiation/quality/duration/timing/severity/associated sxs/prior treatment) HPI Pt presents with c/o headache.  States headache has been present for the past 2 days.  Also has hx of HTN and bipolar disorder.  She states she has run out of her medications.  Does not have a PMD due to financial/insurance issues.  No changes in vision, no focal numbness or weakness, no changes in speech.  No fever or neck pain.  There are no other associated systemic symptoms, there are no other alleviating or modifying factors.   Past Medical History  Diagnosis Date  . Hypertension   . Anxiety   . Bipolar 1 disorder   . Post-traumatic stress syndrome     History reviewed. No pertinent past surgical history.  History reviewed. No pertinent family history.  History  Substance Use Topics  . Smoking status: Current Every Day Smoker    Types: Cigarettes  . Smokeless tobacco: Not on file  . Alcohol Use: No    OB History    Grav Para Term Preterm Abortions TAB SAB Ect Mult Living                  Review of Systems ROS reviewed and all otherwise negative except for mentioned in HPI  Allergies  Review of patient's allergies indicates no known allergies.  Home Medications   Current Outpatient Rx  Name Route Sig Dispense Refill  . ALPRAZOLAM 1 MG PO TABS Oral Take 1 tablet (1 mg total) by mouth 3 (three) times daily. 9 tablet 0  . HYDROCHLOROTHIAZIDE 25 MG PO TABS Oral Take 1 tablet (25 mg total) by mouth daily. 30 tablet 0  . METOPROLOL SUCCINATE ER 25 MG PO TB24 Oral Take 2 tablets (50 mg total) by mouth daily. 30 tablet 0  . QUETIAPINE FUMARATE 300 MG PO TABS Oral Take 1 tablet (300 mg total) by mouth at bedtime. 30 tablet 0  . VITAMIN D 400 UNITS PO TABS Oral Take 800 Units by mouth daily.     Marland Kitchen ALPRAZOLAM 1 MG PO TABS Oral Take 1 tablet (1 mg total) by mouth at bedtime as needed for sleep. 30 tablet 0  . HYDROCHLOROTHIAZIDE 25 MG PO TABS Oral Take 1 tablet (25 mg total) by mouth daily. 30 tablet 1  . METOPROLOL SUCCINATE ER 25 MG PO TB24 Oral Take 2 tablets (50 mg total) by mouth daily. 30 tablet 1  . QUETIAPINE FUMARATE 300 MG PO TABS Oral Take 1 tablet (300 mg total) by mouth at bedtime. 30 tablet 1    BP 161/97  Pulse 100  Temp 98 F (36.7 C) (Oral)  Resp 16  SpO2 98%  LMP 10/12/2011 Vitals reviewed Physical Exam Physical Examination: General appearance - alert, well appearing, and in no distress Mental status - alert, oriented to person, place, and time Eyes - pupils equal and reactive, extraocular eye movements intacts Mouth - mucous membranes moist, pharynx normal without lesions Chest - clear to auscultation, no wheezes, rales or rhonchi, symmetric air entry Heart - normal rate, regular rhythm, normal S1, S2, no murmurs, rubs, clicks or gallops Abdomen - soft, nontender, nondistended, no masses or organomegaly Neurological - alert, oriented, normal speech, cranial nerves 2-12 tested and intact, strength 5/5 in extremities x 4, sensation intact Extremities -  peripheral pulses normal, no pedal edema, no clubbing or cyanosis Skin - normal coloration and turgor, no rashes Psych- flat affect, calm and cooperative  ED Course  Procedures (including critical care time)  Labs Reviewed - No data to display No results found.   1. Hypertension   2. Headache       MDM  Pt presenting with request for medication refills as well as headache.  Headache is gradual in onset maybe tension headache or due to somewhat elevated blood pressure.  No hypertensive emergency or urgency.  Pt with no signs or sx concerning for [facility] or other acute emergent condition. 30 day refills of her chronic meds supplied as well as information for PMD f/u.  Discharged with strict return  precautions.  Pt agreeable with plan.       Ethelda Chick, MD 05/13/12 [phone]

## 2012-05-13 NOTE — ED Notes (Signed)
Per pt sts she has been out of her HTN and Bipolar meds for about 2 weeks. sts since Monday she has had a headache. No distress noted at triage

## 2012-05-13 NOTE — ED Notes (Signed)
Pt states she has been out of HT meds and bipolar meds for 2 weeks.  Has had headache since Monday and associated nausea, denies vomiting. Pt alert oriented X4

## 2012-08-15 ENCOUNTER — Encounter (HOSPITAL_COMMUNITY): Payer: Self-pay | Admitting: Emergency Medicine

## 2012-08-15 ENCOUNTER — Emergency Department (HOSPITAL_COMMUNITY)
Admission: EM | Admit: 2012-08-15 | Discharge: 2012-08-15 | Disposition: A | Discharge status: Home / Self Care | Payer: Self-pay | Attending: Emergency Medicine | Admitting: Emergency Medicine

## 2012-08-15 DIAGNOSIS — F17200 Nicotine dependence, unspecified, uncomplicated: Secondary | ICD-10-CM | POA: Insufficient documentation

## 2012-08-15 DIAGNOSIS — Z760 Encounter for issue of repeat prescription: Secondary | ICD-10-CM | POA: Insufficient documentation

## 2012-08-15 DIAGNOSIS — F411 Generalized anxiety disorder: Secondary | ICD-10-CM | POA: Insufficient documentation

## 2012-08-15 DIAGNOSIS — I10 Essential (primary) hypertension: Secondary | ICD-10-CM | POA: Insufficient documentation

## 2012-08-15 DIAGNOSIS — Z8659 Personal history of other mental and behavioral disorders: Secondary | ICD-10-CM | POA: Insufficient documentation

## 2012-08-15 DIAGNOSIS — F319 Bipolar disorder, unspecified: Secondary | ICD-10-CM | POA: Insufficient documentation

## 2012-08-15 DIAGNOSIS — Z79899 Other long term (current) drug therapy: Secondary | ICD-10-CM | POA: Insufficient documentation

## 2012-08-15 MED ORDER — METOPROLOL SUCCINATE ER 25 MG PO TB24: 50.0000 mg | ORAL_TABLET | Freq: Every day | ORAL | Status: DC

## 2012-08-15 MED ORDER — ALPRAZOLAM 1 MG PO TABS: 1.0000 mg | ORAL_TABLET | Freq: Three times a day (TID) | ORAL | Status: DC | PRN

## 2012-08-15 MED ORDER — QUETIAPINE FUMARATE 300 MG PO TABS: 300.0000 mg | ORAL_TABLET | Freq: Every day | ORAL | Status: DC

## 2012-08-15 MED ORDER — HYDROCHLOROTHIAZIDE 25 MG PO TABS: 25.0000 mg | ORAL_TABLET | Freq: Every day | ORAL | Status: DC

## 2012-08-15 NOTE — ED Notes (Signed)
Patient here for refill on her metoprolol, seroquel, HCTZ, and zanax. Has been out of medication for 3 weeks. Has dah HAs that come and go

## 2012-08-15 NOTE — ED Notes (Signed)
Reports out of perscription for 3 week: psy medications, anxiety, & bp

## 2012-08-15 NOTE — ED Provider Notes (Signed)
History     CSN: [id number]  Arrival date & time 08/15/12  0730   First MD Initiated Contact with Patient 08/15/12 [phone]      Chief Complaint  Patient presents with  . Medication Refill    (Consider location/radiation/quality/duration/timing/severity/associated sxs/prior treatment) HPI Jill [name] is a 47 y.o. female who presents with complaint of running out of her medications. States used to go to Dr. Tenny Craw but ran out of insurance. Working on [employer], research (medical). States has been out of her medications for about 2 wks. No complaints.    Past Medical History  Diagnosis Date  . Hypertension   . Anxiety   . Bipolar 1 disorder   . Post-traumatic stress syndrome     History reviewed. No pertinent past surgical history.  History reviewed. No pertinent family history.  History  Substance Use Topics  . Smoking status: Current Every Day Smoker    Types: Cigarettes  . Smokeless tobacco: Not on file  . Alcohol Use: No    OB History    Grav Para Term Preterm Abortions TAB SAB Ect Mult Living                  Review of Systems  Constitutional: Negative for fever and chills.  Respiratory: Negative.   Cardiovascular: Negative.   Neurological: Negative for headaches.  Psychiatric/Behavioral: Negative for dysphoric mood. The patient is not nervous/anxious.     Allergies  Review of patient's allergies indicates no known allergies.  Home Medications   Current Outpatient Rx  Name  Route  Sig  Dispense  Refill  . ALPRAZOLAM 1 MG PO TABS   Oral   Take 1 mg by mouth 3 (three) times daily as needed. For anxiety.         Marland Kitchen VITAMIN D 1000 UNITS PO TABS   Oral   Take 1,000 Units by mouth daily.         Marland Kitchen HYDROCHLOROTHIAZIDE 25 MG PO TABS   Oral   Take 1 tablet (25 mg total) by mouth daily.   30 tablet   0   . METOPROLOL SUCCINATE ER 25 MG PO TB24   Oral   Take 2 tablets (50 mg total) by mouth daily.   30 tablet   0   . QUETIAPINE FUMARATE 300 MG PO  TABS   Oral   Take 1 tablet (300 mg total) by mouth at bedtime.   30 tablet   0     BP 128/94  Pulse 86  Temp 98.7 F (37.1 C) (Oral)  Resp 16  Ht 5\' 7"  (1.702 m)  Wt 240 lb (108.863 kg)  BMI 37.59 kg/m2  SpO2 98%  LMP 10/12/2011  Physical Exam  Nursing note and vitals reviewed. Constitutional: She appears well-developed and well-nourished. No distress.  Cardiovascular: Normal rate.   Pulmonary/Chest: Effort normal and breath sounds normal. No respiratory distress. She has no wheezes. She has no rales.  Musculoskeletal: She exhibits no edema.  Skin: Skin is warm and dry.  Psychiatric: She has a normal mood and affect. Her behavior is normal.    ED Course  Procedures (including critical care time)  Labs Reviewed - No data to display No results found.  Filed Vitals:   08/15/12 0759  BP: 128/94  Pulse: 86  Temp: 98.7 F (37.1 C)  Resp: 16   1. Medication refill       MDM  Pt here with no complaints. Has no PCP and asking for med  refill. Her VS normal. She appears in no distress. Discussed follow up options, will refill meds.           Lottie Mussel, [place] 08/15/12 [phone]

## 2012-08-15 NOTE — ED Provider Notes (Signed)
Medical screening examination/treatment/procedure(s) were performed by non-physician practitioner and as supervising physician I was immediately available for consultation/collaboration.   Dione Booze, MD 08/15/12 [phone]

## 2012-08-15 NOTE — ED Notes (Signed)
Patient signed a printed discharge form

## 2012-11-21 ENCOUNTER — Encounter (HOSPITAL_COMMUNITY): Payer: Self-pay

## 2012-11-21 ENCOUNTER — Emergency Department (HOSPITAL_COMMUNITY)
Admission: EM | Admit: 2012-11-21 | Discharge: 2012-11-21 | Disposition: A | Discharge status: Home / Self Care | Payer: Self-pay | Attending: Emergency Medicine | Admitting: Emergency Medicine

## 2012-11-21 DIAGNOSIS — I10 Essential (primary) hypertension: Secondary | ICD-10-CM | POA: Insufficient documentation

## 2012-11-21 DIAGNOSIS — F411 Generalized anxiety disorder: Secondary | ICD-10-CM | POA: Insufficient documentation

## 2012-11-21 DIAGNOSIS — R51 Headache: Secondary | ICD-10-CM | POA: Insufficient documentation

## 2012-11-21 DIAGNOSIS — Z79899 Other long term (current) drug therapy: Secondary | ICD-10-CM | POA: Insufficient documentation

## 2012-11-21 DIAGNOSIS — F17200 Nicotine dependence, unspecified, uncomplicated: Secondary | ICD-10-CM | POA: Insufficient documentation

## 2012-11-21 DIAGNOSIS — F319 Bipolar disorder, unspecified: Secondary | ICD-10-CM | POA: Insufficient documentation

## 2012-11-21 DIAGNOSIS — F4310 Post-traumatic stress disorder, unspecified: Secondary | ICD-10-CM | POA: Insufficient documentation

## 2012-11-21 DIAGNOSIS — Z760 Encounter for issue of repeat prescription: Secondary | ICD-10-CM | POA: Insufficient documentation

## 2012-11-21 MED ORDER — METOPROLOL TARTRATE 50 MG PO TABS
100.0000 mg | ORAL_TABLET | Freq: Two times a day (BID) | ORAL | Status: DC
Start: 2012-11-21 — End: 2013-10-12

## 2012-11-21 MED ORDER — HYDROCHLOROTHIAZIDE 25 MG PO TABS: 25.0000 mg | ORAL_TABLET | Freq: Every day | ORAL | Status: DC

## 2012-11-21 MED ORDER — QUETIAPINE FUMARATE 300 MG PO TABS: 300.0000 mg | ORAL_TABLET | Freq: Every day | ORAL | Status: DC

## 2012-11-21 NOTE — ED Notes (Signed)
She states she is here for refill of: Xanax 1mg  t.i.d. Prn; Metoprolol 50mg  q.d.; Seroquel 300mg  h.s.; and HCTZ 25mg  q.d.  She denies any pain, discomfort, or any other medical problems. She states she simply cannot afford to go see her family doctor (C. Donovan Kail).

## 2012-11-21 NOTE — ED Notes (Addendum)
Patient denies blurred vision and dizziness. PA in to evaluate already

## 2012-11-21 NOTE — ED Provider Notes (Signed)
History     CSN: [id number]  Arrival date & time 11/21/12  4098   First MD Initiated Contact with Patient 11/21/12 [phone]      Chief Complaint  Patient presents with  . Medication Refill    (Consider location/radiation/quality/duration/timing/severity/associated sxs/prior treatment) HPI Jill [name] is a 47 y.o. female who presents to ED with complaint of medication refill and a headache for two days. Pt states she has been out of her serouel, xanax, toprol, and HCTZ for 2 wks. Headache onset two days ago. States headache is constant, in temples. Took ibuprofen which did not help. States hx of the same. States thinks it is because her blood pressure is too high. Gradual in on set. No neck pain or stiffness. No fever. States she does not have a PCP, was told there is a cap on orange cards, and states urgent care will not fill her xanax and seroquel prescription.    Past Medical History  Diagnosis Date  . Hypertension   . Anxiety   . Bipolar 1 disorder   . Post-traumatic stress syndrome     History reviewed. No pertinent past surgical history.  History reviewed. No pertinent family history.  History  Substance Use Topics  . Smoking status: Current Every Day Smoker    Types: Cigarettes  . Smokeless tobacco: Not on file  . Alcohol Use: No    OB History   Grav Para Term Preterm Abortions TAB SAB Ect Mult Living                  Review of Systems  Constitutional: Negative for fever and chills.  HENT: Positive for neck stiffness. Negative for neck pain.   Eyes: Negative for photophobia and pain.  Respiratory: Negative.   Cardiovascular: Negative.   Genitourinary: Negative for dysuria and flank pain.  Skin: Negative for rash.  Neurological: Negative for dizziness and headaches.  All other systems reviewed and are negative.    Allergies  Review of patient's allergies indicates no known allergies.  Home Medications   Current Outpatient Rx  Name  Route  Sig   Dispense  Refill  . ALPRAZolam (XANAX) 1 MG tablet   Oral   Take 1 mg by mouth 3 (three) times daily as needed. For anxiety.         . ALPRAZolam (XANAX) 1 MG tablet   Oral   Take 1 tablet (1 mg total) by mouth 3 (three) times daily as needed for anxiety.   30 tablet   1   . cholecalciferol (VITAMIN D) 1000 UNITS tablet   Oral   Take 1,000 Units by mouth daily.         . hydrochlorothiazide (HYDRODIURIL) 25 MG tablet   Oral   Take 1 tablet (25 mg total) by mouth daily.   30 tablet   0   . hydrochlorothiazide (HYDRODIURIL) 25 MG tablet   Oral   Take 1 tablet (25 mg total) by mouth daily.   30 tablet   1   . metoprolol succinate (TOPROL-XL) 25 MG 24 hr tablet   Oral   Take 2 tablets (50 mg total) by mouth daily.   30 tablet   0   . metoprolol succinate (TOPROL-XL) 25 MG 24 hr tablet   Oral   Take 2 tablets (50 mg total) by mouth daily.   60 tablet   1   . QUEtiapine (SEROQUEL) 300 MG tablet   Oral   Take 1 tablet (300 mg total)  by mouth at bedtime.   30 tablet   0   . QUEtiapine (SEROQUEL) 300 MG tablet   Oral   Take 1 tablet (300 mg total) by mouth at bedtime.   30 tablet   1     BP 127/94  Pulse 89  Temp(Src) 98.5 F (36.9 C) (Oral)  SpO2 100%  LMP 10/12/2011  Physical Exam  Nursing note and vitals reviewed. Constitutional: She appears well-developed and well-nourished. No distress.  HENT:  Head: Normocephalic and atraumatic.  Right Ear: External ear normal.  Left Ear: External ear normal.  Nose: Nose normal.  Mouth/Throat: Oropharynx is clear and moist.  TMs normal bilaterally  Eyes: Conjunctivae are normal. Pupils are equal, round, and reactive to light.  Neck: Normal range of motion. Neck supple.  Cardiovascular: Normal rate, regular rhythm and normal heart sounds.   Pulmonary/Chest: Effort normal and breath sounds normal. No respiratory distress. She has no wheezes. She has no rales.  Musculoskeletal: She exhibits no edema.   Neurological: She is alert.  Skin: Skin is warm and dry.    ED Course  Procedures (including critical care time)   1. Medication refill   2. Headache       MDM  Pt here with non concerning headache, suspect tension headache. This is a recurrent visit for the patient for medication refill. Today explained no longer will be filling xanax. Will consult social worker. Pt states [facility] would cost her 90 dollars a month, and urgent care will not fill her xanax either. i have given pt her other prescriptions and refills x4, encouraged to find a PCP.   Filed Vitals:   11/21/12 0750  BP: 127/94  Pulse: 89  Temp: 98.5 F (36.9 C)  TempSrc: Oral  SpO2: 100%         Lottie Mussel, PA-C 11/21/12 [phone]

## 2012-11-21 NOTE — ED Notes (Signed)
[facility] number given to patient by pharmacy. She will call them next week to set up an appt. PA made aware. Does not have to wait for pharmacy

## 2012-11-21 NOTE — ED Notes (Signed)
Patient awaiting social work.

## 2012-11-23 NOTE — ED Provider Notes (Signed)
Medical screening examination/treatment/procedure(s) were performed by non-physician practitioner and as supervising physician I was immediately available for consultation/collaboration.  John-Adam Melysa Schroyer, M.D.   John-Adam Krisanne Lich, MD 11/23/12 0753 

## 2013-09-06 ENCOUNTER — Emergency Department (HOSPITAL_COMMUNITY)
Admission: EM | Admit: 2013-09-06 | Discharge: 2013-09-06 | Disposition: A | Discharge status: Home / Self Care | Payer: Medicaid Other | Attending: Emergency Medicine | Admitting: Emergency Medicine

## 2013-09-06 ENCOUNTER — Encounter (HOSPITAL_COMMUNITY): Payer: Self-pay | Admitting: Emergency Medicine

## 2013-09-06 DIAGNOSIS — F411 Generalized anxiety disorder: Secondary | ICD-10-CM | POA: Insufficient documentation

## 2013-09-06 DIAGNOSIS — F17200 Nicotine dependence, unspecified, uncomplicated: Secondary | ICD-10-CM | POA: Insufficient documentation

## 2013-09-06 DIAGNOSIS — F319 Bipolar disorder, unspecified: Secondary | ICD-10-CM | POA: Insufficient documentation

## 2013-09-06 DIAGNOSIS — R519 Headache, unspecified: Secondary | ICD-10-CM

## 2013-09-06 DIAGNOSIS — Z760 Encounter for issue of repeat prescription: Secondary | ICD-10-CM | POA: Insufficient documentation

## 2013-09-06 DIAGNOSIS — R51 Headache: Secondary | ICD-10-CM | POA: Insufficient documentation

## 2013-09-06 DIAGNOSIS — F4310 Post-traumatic stress disorder, unspecified: Secondary | ICD-10-CM | POA: Insufficient documentation

## 2013-09-06 DIAGNOSIS — Z79899 Other long term (current) drug therapy: Secondary | ICD-10-CM | POA: Insufficient documentation

## 2013-09-06 DIAGNOSIS — I10 Essential (primary) hypertension: Secondary | ICD-10-CM | POA: Insufficient documentation

## 2013-09-06 MED ORDER — HYDROCHLOROTHIAZIDE 25 MG PO TABS
25.0000 mg | ORAL_TABLET | Freq: Every day | ORAL | Status: DC
Start: 2013-09-06 — End: 2013-10-12

## 2013-09-06 MED ORDER — METOPROLOL TARTRATE 50 MG PO TABS: 50.0000 mg | ORAL_TABLET | Freq: Two times a day (BID) | ORAL | Status: DC

## 2013-09-06 NOTE — ED Notes (Addendum)
Pt states she lost her insurance so no longer sees "Dr. Tenny Crawoss". States she is out of the following meds:  Seroquel 300 mg HS HCTZ 25 mg daily Metoprolol 50 mg BID Xanax 1 mg TID  States she has been out of meds x 1 week.

## 2013-09-06 NOTE — Discharge Instructions (Signed)
Follow-up with a doctor for re-fills of your medications  Return to the emergency department if you develop any changing/worsening condition, vision changes, weakness, slurred speech/difficulty walking, confusion, weakness, loss of sensation, or any other concerns (please read additional information regarding your condition below)    Medication Refill, Emergency Department We have refilled your medication today as a courtesy to you. It is best for your medical care, however, to take care of getting refills done through your primary caregiver's office. They have your records and can do a better job of follow-up than we can in the emergency department. On maintenance medications, we often only prescribe enough medications to get you by until you are able to see your regular caregiver. This is a more expensive way to refill medications. In the future, please plan for refills so that you will not have to use the emergency department for this. Thank you for your help. Your help allows us to better take care of the daily emergencies that enter our department. Document Released: 11/01/2003 Document Revised: 10/07/2011 Document Reviewed: 07/15/2005 [facility] Patient Information 2014 [place]Care, [place].    Headache TREATMENT  Medicines may be given to help relieve symptoms.   HOME CARE INSTRUCTIONS   Only take over-the-counter or prescription medicines for pain or discomfort as directed by your caregiver.   Lie down in a dark, quiet room when you have a headache.   Keep a journal to find out what may be triggering your headaches. For example, write down:  What you eat and drink.  How much sleep you get.  Any change to your diet or medicines.  Try massage or other relaxation techniques.   Ice packs or heat applied to the head and neck can be used. Use these 3 to 4 times per day for 15 to 20 minutes each time, or as needed.   Limit stress.   Sit up straight, and do not tense your muscles.    Quit smoking if you smoke.  Limit alcohol use.  Decrease the amount of caffeine you drink, or stop drinking caffeine.  Eat and exercise regularly.  Get 7 to 9 hours of sleep, or as recommended by your caregiver.  Avoid excessive use of pain medicine as recurrent headaches can occur.  SEEK MEDICAL CARE IF:   You have problems with the medicines you were prescribed.  Your medicines do not work.  You have a change from the usual headache.  You have nausea or vomiting. SEEK IMMEDIATE MEDICAL CARE IF:   Your headache becomes severe.  You have a fever.  You have a stiff neck.  You have loss of vision.  You have muscular weakness or loss of muscle control.  You lose your balance or have trouble walking.  You feel faint or pass out.  You have severe symptoms that are different from your first symptoms. MAKE SURE YOU:   Understand these instructions.  Will watch your condition.  Will get help right away if you are not doing well or get worse. Document Released: 07/15/2005 Document Revised: 10/07/2011 Document Reviewed: 07/05/2011 [facility] Patient Information 2014 [place]ExitCare, [place]LLC.   Emergency Department Resource Guide 1) Find a Doctor and Pay Out of Pocket Although you won't have to find out who is covered by your insurance plan, it is a good idea to ask around and get recommendations. You will then need to call the office and see if the doctor you have chosen will accept you as a new patient and what types of options  they offer for patients who are self-pay. Some doctors offer discounts or will set up payment plans for their patients who do not have insurance, but you will need to ask so you aren't surprised when you get to your appointment.  2) Contact Your Local Health Department Not all health departments have doctors that can see patients for sick visits, but many do, so it is worth a call to see if yours does. If you don't know where your local health  department is, you can check in your phone book. The CDC also has a tool to help you locate your state's health department, and many state websites also have listings of all of their local health departments.  3) Find a Walk-in Clinic If your illness is not likely to be very severe or complicated, you may want to try a walk in clinic. These are popping up all over the country in pharmacies, drugstores, and shopping centers. They're usually staffed by nurse practitioners or physician assistants that have been trained to treat common illnesses and complaints. They're usually fairly quick and inexpensive. However, if you have serious medical issues or chronic medical problems, these are probably not your best option.  No Primary Care Doctor: - Call Health Connect at  [phone] - they can help you locate a primary care doctor that  accepts your insurance, provides certain services, etc. - Physician Referral Service- [phone]  Chronic Pain Problems: Organization         Address  Phone   Notes  [facility]  [phone] Patients need to be referred by their primary care doctor.   Medication Assistance: Organization         Address  Phone   Notes  [facility] Medication [facility] [address] [place] [place], [place] [address] [phone] --Must be a resident of [facility] -- Must have NO insurance coverage whatsoever (no Medicaid/ Medicare, etc.) -- The pt. MUST have a primary care doctor that directs their care regularly and follows them in the community   [facility]  [phone]   [employer]  [phone]    Agencies that provide inexpensive medical care: Organization         Address  Phone   Notes  [facility]  [phone]   [facility]    [phone]   [facility] [address] [place], [place] [address] [phone]   [facility] [address] [place], [place] [phone]   Planned Parenthood    [phone]   [facility]    [phone]   [facility]  [address], [place] Phone:  [phone], Fax:  [phone] Hours of Operation:  9 am - 6 pm, M-F.  Also accepts Medicaid/Medicare and self-pay.  [facility]  [address], [place] Phone: [phone], Fax: [phone]. Hours of Operation:  8:30 am - 5:30 pm, M-F.  Also accepts Medicaid and self-pay.  [facility] [place] [address], [place] Phone: [phone]   [facility] [address] Natasha Bence [place], [place] [phone], Ext. [phone] Mondays & Thursdays: 7-9 AM.  First 15 patients are seen on a first come, first serve basis.    Medicaid-accepting [facility] Providers:  Organization         Address  Phone   Notes  [employer]  [facility] [address], [place] [phone] Also accepts self-pay patients.  [facility] [address] Laurell Josephs [place], [place]  [phone]   [facility] [address], [place] [phone]   [facility] [address] [place] [phone]   Renaye Rakers [address], [place]   [phone] Only accepts [place] Access [place] patients after they have their name applied to their card.   Self-Pay (no insurance) in [facility]:  Organization         Address  Phone   Notes  Sickle Cell Patients, [facility] [address] [place], [place] [phone]   [facility] [address] [place], [place] [phone]   [facility] [place]  [address],  [phone]   [facility]/Dr. Osei-Bonsu  [address] [place] or [address], [place] [phone] Phone number for both [place] and  [place] locations is the same.  [facility] [address], [place] [phone]   [facility] [address] [place] or [address] [phone]   [facility] [address] [place] [phone], phone; [phone], fax Sees patients 1st and 3rd Saturday of every month.  Must not qualify for public or private insurance (i.e. Medicaid, Medicare, [place] Health Choice, Veterans' Benefits)  Household income should be no more than 200% of the poverty level The clinic cannot treat you if you are pregnant or think you are pregnant  Sexually transmitted diseases are not treated at the clinic.    Dental Care: Organization         Address  Phone  Notes  [facility] Department of [facility] [address] [place], [place] [phone] Accepts children up to age 4 who are enrolled in [place] or [place] Health Choice; pregnant women with a Medicaid card; and children who have applied for Medicaid or [place] Health Choice, but were declined, whose parents can pay a reduced fee at time of service.  [facility] Department of [facility]  [address], [place] [phone] Accepts children up to age 52 who are enrolled in [place] or [place] Health Choice; pregnant women with a Medicaid card; and children who have applied for Medicaid or [place] Health Choice, but were declined, whose parents can pay a reduced fee at time of service.  [facility]  [address] [place], [place] [phone] Patients are seen by appointment only. Walk-ins are not accepted. [facility] will see patients 41 years of age and older. Monday - Tuesday (8am-5pm) Most Wednesdays (8:30-5pm) $30 per visit, cash only  [facility] Access [facility]  [address], [facility] [phone] Patients are seen by appointment only. Walk-ins are not accepted.  [facility] will see patients 92 years of age and older. One Wednesday Evening (Monthly: Volunteer Based).  $30 per visit, cash only  [employer]  [phone] for adults; Children under age 52, call Graduate Pediatric Dentistry at [phone]. Children aged 90-14, please call [phone] to request a pediatric application.  Dental services are provided in all areas of dental care including fillings, crowns and bridges, complete and partial dentures,  implants, gum treatment, root canals, and extractions. Preventive care is also provided. Treatment is provided to both adults and children. Patients are selected via a lottery and there is often a waiting list.   [facility] [address] [place]  [phone] [email or web address]   [facility] [address] [place], [place] [phone], Ext. [phone] Second and Fourth Thursday of each month, opens at 6:30 AM; Clinic ends at 9 AM.  Patients are seen on a first-come first-served basis, and a limited number are seen during each clinic.   [facility]  [address] Ether Griffins [place], [place] [phone]   Eligibility Requirements You must have lived in [place], [place], or [place] counties for at least the last three months.   You cannot be eligible for state or federal sponsored [place], including [employer], [place], or [employer].   You generally cannot be eligible for healthcare insurance through your employer.    How to apply: Eligibility screenings are held every Tuesday and Wednesday afternoon from 1:00 pm until 4:00 pm. You do not need an appointment for the interview!  [facility] [address], [place], [place] [phone]   [facility] Health Department  [phone]   [facility] Health Department  [phone]   [facility] Health Department  [phone]    Behavioral Health Resources in the  Community: Intensive Outpatient Programs Organization         Address  Phone  Notes  [facility] [address] [place], [place] [phone]   [facility] Outpatient [address] [place], [place] [phone]   [facility] [address] [place], [place]  [phone]   [facility] [address],  [place], [place] [phone] or [phone]   Substance Abuse Resources Organization         Address  Phone  Notes  [facility] Drug Services  [phone]   [facility]  [phone]   [facility] [place]  [phone]   Floydene Flock  [phone]   Residential & Outpatient Substance Abuse Program  [phone]   Psychological Services Organization         Address  Phone  Notes  [facility] Health  [phone]   [facility] Services  [phone]   [facility] Mental Health [address], [place] [phone] or [phone]    Mobile Crisis Teams Organization         Address  Phone  Notes  [facility], Mobile Crisis Care Unit  [phone]   [facility]  [address] [place], [place] [phone]   Doristine Locks [address] [place] [phone]    Self-Help/Support Groups Organization         Address  Phone             Notes  [facility] [place] - variety of support groups  336- I7437963 Call for more information  Narcotics Anonymous (NA), [facility] [address], [employer] [place]  2 meetings at this location   Statistician         Address  Phone  Notes  [facility] [address],    [place]  [phone]   [facility]  [address], [place] [address], [place], [place] [phone]   [facility] [address] [place] [address] [place], [place] [phone] Admissions: 8am-3pm M-F  [facility] [address],    [place], [place] [phone]   [facility] [address] [place], [place], [place] [phone]   [facility] [address]  [place], [place] [phone]   [facility] - Intensive Outpatient [address] Laurell Josephs [address], [place], [place] [phone]   [facility] ([facility] [address] [place]  [place], [place] [phone] or [phone]   [facility] ([facility]) [address], [place], [place] [phone] Accepts Medicaid  [facility] [address],  [place] [phone] Substance Abuse/Addiction Treatment   [facility] Organization         Address  Phone  Notes  [facility]  [phone]   Angie Fava, PhD [address] Ervin Knack [place], [place]   [phone] or [phone]   [facility]   [address] [place], [place] [phone]   [facility] [address], [place], [place] [phone] Insurance/Medicaid/sponsorship through [facility] and Families [address]                                    [place], [place] [phone] Therapy/tele-psych/case  [facility] [address], [place] [phone]    Dr. Lolly Mustache  [phone]   [facility] [place]  [employer] [facility] Dept. 1) [address] [place] 2) [address], [place] 3)  [address], [place] [phone]  [phone]   [facility] Child Abuse Hotline [phone] or [phone] (After Hours)

## 2013-09-06 NOTE — ED Provider Notes (Signed)
CSN: [id number]     Arrival date & time 09/06/13  1054 History  This chart was scribed for non-physician practitioner, Coral Ceo, PA-C working with Bonnita Levan. Bernette Mayers, MD by Greggory Stallion, ED scribe. This patient was seen in room TR08C/TR08C and the patient's care was started at 12:24 PM.   Chief Complaint  Patient presents with  . Medication Refill  . Headache   The history is provided by the patient. No language interpreter was used.   HPI Comments: Jill [name] is a 48 y.o. female with a PMH of HTN, anxiety, bipolar disorder, and PTSD who presents to the Emergency Department for medication refill. She states she has been out of her blood pressure medication for one week. Pt does not have refills left on any of her medications. She had a PCP Dr. Tenny Craw but cannot see this provider due to lack of insurance. Patient takes Metoprolol and HCTZ. She also complains of a gradual onset, intermittent headache for two days and states it normally happens where her blood pressure is elevated and she is "stressed." Her headache is located on the right side of her head without radiation. No photophobia or aggravating factors. Rates the pain 4/10. Denies trauma, injury or fall. She has taken tylenol with no relief. Headaches similar to her headaches in the past and is not the worse headache of her life. Denies fever, sore throat, rhinorrhea, visual disturbance, nausea, emesis, neck stiffness, weakness.    Past Medical History  Diagnosis Date  . Hypertension   . Anxiety   . Bipolar 1 disorder   . Post-traumatic stress syndrome    History reviewed. No pertinent past surgical history. No family history on file. History  Substance Use Topics  . Smoking status: Current Every Day Smoker    Types: Cigarettes  . Smokeless tobacco: Not on file  . Alcohol Use: No   OB History   Grav Para Term Preterm Abortions TAB SAB Ect Mult Living                 Review of Systems  Constitutional: Negative for  fever.  HENT: Negative for rhinorrhea and sore throat.   Eyes: Negative for visual disturbance.  Gastrointestinal: Negative for nausea and vomiting.  Neurological: Positive for headaches. Negative for weakness.  All other systems reviewed and are negative.   Allergies  Review of patient's allergies indicates no known allergies.  Home Medications   Current Outpatient Rx  Name  Route  Sig  Dispense  Refill  . ALPRAZolam (XANAX) 1 MG tablet   Oral   Take 1 mg by mouth 3 (three) times daily as needed. For anxiety.         . cholecalciferol (VITAMIN D) 1000 UNITS tablet   Oral   Take 1,000 Units by mouth daily.         . hydrochlorothiazide (HYDRODIURIL) 25 MG tablet   Oral   Take 1 tablet (25 mg total) by mouth daily.   30 tablet   0   . hydrochlorothiazide (HYDRODIURIL) 25 MG tablet   Oral   Take 1 tablet (25 mg total) by mouth daily.   30 tablet   4   . metoprolol (LOPRESSOR) 50 MG tablet   Oral   Take 2 tablets (100 mg total) by mouth 2 (two) times daily.   60 tablet   4   . EXPIRED: metoprolol succinate (TOPROL-XL) 25 MG 24 hr tablet   Oral   Take 2 tablets (50 mg total)  by mouth daily.   30 tablet   0   . QUEtiapine (SEROQUEL) 300 MG tablet   Oral   Take 1 tablet (300 mg total) by mouth at bedtime.   30 tablet   0   . QUEtiapine (SEROQUEL) 300 MG tablet   Oral   Take 1 tablet (300 mg total) by mouth at bedtime.   30 tablet   4    BP 142/90  Pulse 94  Temp(Src) 98.2 F (36.8 C) (Oral)  Resp 18  SpO2 99%  LMP 10/12/2011  Filed Vitals:   09/06/13 1114 09/06/13 1246  BP: 142/90 133/91  Pulse: 94 84  Temp: 98.2 F (36.8 C)   TempSrc: Oral   Resp: 18 18  SpO2: 99% 100%    Physical Exam  Nursing note and vitals reviewed. Constitutional: She is oriented to person, place, and time. She appears well-developed and well-nourished. No distress.  HENT:  Head: Normocephalic and atraumatic.  Right Ear: Tympanic membrane, external ear and ear  canal normal.  Left Ear: Tympanic membrane, external ear and ear canal normal.  Nose: Nose normal.  Mouth/Throat: Oropharynx is clear and moist. No oropharyngeal exudate.  No tenderness to the scalp or face throughout. No palpable hematoma, step-offs, or lacerations throughout.  Tympanic membranes gray and translucent bilaterally.    Eyes: Conjunctivae and EOM are normal. Pupils are equal, round, and reactive to light. Right eye exhibits no discharge. Left eye exhibits no discharge.  Neck: Normal range of motion. Neck supple. No tracheal deviation present.  No rigidity  Cardiovascular: Normal rate, regular rhythm and normal heart sounds.  Exam reveals no gallop and no friction rub.   No murmur heard. Pulmonary/Chest: Effort normal and breath sounds normal. No respiratory distress. She has no wheezes. She has no rales. She exhibits no tenderness.  Abdominal: Soft. She exhibits no distension. There is no tenderness.  Musculoskeletal: Normal range of motion. She exhibits no edema and no tenderness.  Strength 5/5 in the upper and lower extremities bilaterally. Patient able to ambulate without difficulty or ataxia.  Neurological: She is alert and oriented to person, place, and time.  GCS 15.  No focal neurological deficits.  CN 2-12 intact.  No pronator drift.   Skin: Skin is warm and dry. She is not diaphoretic.  Psychiatric: She has a normal mood and affect. Her behavior is normal.    ED Course  Procedures (including critical care time)  DIAGNOSTIC STUDIES: Oxygen Saturation is 99% on RA, normal by my interpretation.    COORDINATION OF CARE: 12:28 PM-Discussed treatment plan which includes refilling blood pressure medications with pt at bedside and pt agreed to plan.   Labs Review Labs Reviewed - No data to display Imaging Review No results found.  EKG Interpretation   None       MDM   Jill [name] is a 48 y.o. female with a PMH of HTN, anxiety, bipolar disorder, and PTSD  who presents to the Emergency Department for medication refill. Patient has a hx of medication refills in the past with multiple ED visits. BP medication refilled and patient encouraged to follow-up with PCP. Resources provided. Patient also complained of a headache. No concerning warning signs on hx and no neurological deficits on exam. Patient afebrile and non-toxic in appearance. Patient neurovascularly intact. BP mildly elevated despite being out of anti-HTN medications(142/90). Headache similar to headaches in the past. Return precautions, discharge instructions, and follow-up was discussed with the patient before discharge.  Discharge Medication List as of 09/06/2013 12:37 PM    START taking these medications   Details  !! hydrochlorothiazide (HYDRODIURIL) 25 MG tablet Take 1 tablet (25 mg total) by mouth daily., Starting 09/06/2013, Until Discontinued, Print    !! metoprolol (LOPRESSOR) 50 MG tablet Take 1 tablet (50 mg total) by mouth 2 (two) times daily., Starting 09/06/2013, Until Discontinued, Print     !! - Potential duplicate medications found. Please discuss with provider.       Final impressions: 1. Encounter for medication refill   2. Headache      Luiz IronJessica Katlin Sharetha Newson PA-C   I personally performed the services described in this documentation, which was scribed in my presence. The recorded information has been reviewed and is accurate.       Jillyn LedgerJessica K Jatavius [place], PA-C 09/06/13 1939

## 2013-09-06 NOTE — ED Notes (Signed)
Pt states has been out blood pressure medication for one week and has headache, no other symptoms.  Pt states this has happened before when running out of medication

## 2013-09-08 NOTE — ED Provider Notes (Signed)
Medical screening examination/treatment/procedure(s) were performed by non-physician practitioner and as supervising physician I was immediately available for consultation/collaboration.  EKG Interpretation   None         [name], MD 09/08/13 0707 

## 2013-10-12 ENCOUNTER — Emergency Department (HOSPITAL_COMMUNITY)
Admission: EM | Admit: 2013-10-12 | Discharge: 2013-10-12 | Disposition: A | Discharge status: Home / Self Care | Payer: Medicaid Other | Attending: Emergency Medicine | Admitting: Emergency Medicine

## 2013-10-12 ENCOUNTER — Encounter (HOSPITAL_COMMUNITY): Payer: Self-pay | Admitting: Emergency Medicine

## 2013-10-12 DIAGNOSIS — F319 Bipolar disorder, unspecified: Secondary | ICD-10-CM | POA: Insufficient documentation

## 2013-10-12 DIAGNOSIS — F411 Generalized anxiety disorder: Secondary | ICD-10-CM | POA: Insufficient documentation

## 2013-10-12 DIAGNOSIS — Z79899 Other long term (current) drug therapy: Secondary | ICD-10-CM | POA: Insufficient documentation

## 2013-10-12 DIAGNOSIS — F17200 Nicotine dependence, unspecified, uncomplicated: Secondary | ICD-10-CM | POA: Insufficient documentation

## 2013-10-12 DIAGNOSIS — F4310 Post-traumatic stress disorder, unspecified: Secondary | ICD-10-CM | POA: Insufficient documentation

## 2013-10-12 DIAGNOSIS — Z760 Encounter for issue of repeat prescription: Secondary | ICD-10-CM | POA: Insufficient documentation

## 2013-10-12 DIAGNOSIS — I10 Essential (primary) hypertension: Secondary | ICD-10-CM | POA: Insufficient documentation

## 2013-10-12 MED ORDER — QUETIAPINE FUMARATE 300 MG PO TABS: 300.0000 mg | ORAL_TABLET | Freq: Every day | ORAL | Status: DC

## 2013-10-12 MED ORDER — METOPROLOL TARTRATE 50 MG PO TABS: 50.0000 mg | ORAL_TABLET | Freq: Two times a day (BID) | ORAL | Status: DC

## 2013-10-12 MED ORDER — HYDROCHLOROTHIAZIDE 25 MG PO TABS: 25.0000 mg | ORAL_TABLET | Freq: Every day | ORAL | Status: DC

## 2013-10-12 NOTE — Discharge Instructions (Signed)
Take all of your medications as prescribed.  Follow up with your primary care doctor asap.  If you need a referral to new physician, call [facility], [phone] for assistance.   You may return to the ER if symptoms worsen or you have any other concerns.

## 2013-10-12 NOTE — ED Provider Notes (Signed)
CSN: [id number]     Arrival date & time 10/12/13  1941 History  This chart was scribed for non-physician practitioner Annamaria Helling, PA-C working with Laray Anger, DO by Dorothey Baseman, ED Scribe. This patient was seen in room WTR8/WTR8 and the patient's care was started at 10:12 PM.    Chief Complaint  Patient presents with  . Medication Refill   The history is provided by the patient. No language interpreter was used.   HPI Comments: Jill [name] is a 48 y.o. female with a history of HTN, anxiety, bipolar 1 disorder, and PTSD who presents to the Emergency Department requesting a refill of her prescriptions for Xanax (1 mg 3 times daily), Seroquel (300 mg daily), Lopressor (50 mg twice daily), and hydrodiuril (25 mg daily) that she states she ran out of 2-3 days ago. Patient reports that she has not been able to follow up with her PCP to refill her prescriptions due to current lack of health insurance, but will be acquiring health insurance in about 2 months. Patient was seen here on 09/06/2013 for similar complaints and was given a refill of her HTN medications and resources to help her find a PCP. She states that she did not follow up with the PCP referrals due to financial reasons. Patient states that she has been acquiring her Xanax by buying them from the street. She denies suicidal or homicidal ideations.   Past Medical History  Diagnosis Date  . Hypertension   . Anxiety   . Bipolar 1 disorder   . Post-traumatic stress syndrome    History reviewed. No pertinent past surgical history. No family history on file. History  Substance Use Topics  . Smoking status: Current Every Day Smoker -- 0.50 packs/day    Types: Cigarettes  . Smokeless tobacco: Not on file  . Alcohol Use: No   OB History   Grav Para Term Preterm Abortions TAB SAB Ect Mult Living                 Review of Systems  A complete 10 system review of systems was obtained and all systems are negative  except as noted in the HPI and PMH.    Allergies  Review of patient's allergies indicates no known allergies.  Home Medications   Current Outpatient Rx  Name  Route  Sig  Dispense  Refill  . ALPRAZolam (XANAX) 1 MG tablet   Oral   Take 1 mg by mouth 3 (three) times daily as needed. For anxiety.         . hydrochlorothiazide (HYDRODIURIL) 25 MG tablet   Oral   Take 1 tablet (25 mg total) by mouth daily.   30 tablet   0   . ibuprofen (ADVIL,MOTRIN) 200 MG tablet   Oral   Take 600 mg by mouth every 6 (six) hours as needed for headache.         . metoprolol (LOPRESSOR) 50 MG tablet   Oral   Take 1 tablet (50 mg total) by mouth 2 (two) times daily.   60 tablet   0   . QUEtiapine (SEROQUEL) 300 MG tablet   Oral   Take 1 tablet (300 mg total) by mouth at bedtime.   30 tablet   4    Triage Vitals: BP 147/99  Pulse 101  Temp(Src) 98.3 F (36.8 C) (Oral)  Resp 18  Ht 5\' 7"  (1.702 m)  Wt 248 lb (112.492 kg)  BMI 38.83 kg/m2  SpO2  100%  LMP 10/12/2011  Physical Exam  Nursing note and vitals reviewed. Constitutional: She is oriented to person, place, and time. She appears well-developed and well-nourished. No distress.  HENT:  Head: Normocephalic and atraumatic.  Eyes: Conjunctivae are normal.  Neck: Normal range of motion. Neck supple.  Cardiovascular: Normal rate, regular rhythm and normal heart sounds.   Mildly hypertensive  Pulmonary/Chest: Effort normal and breath sounds normal. No respiratory distress.  Abdominal: She exhibits no distension.  Musculoskeletal: Normal range of motion.  Neurological: She is alert and oriented to person, place, and time.  Skin: Skin is warm and dry.  Psychiatric: She has a normal mood and affect. Her behavior is normal.    ED Course  Procedures (including critical care time)  DIAGNOSTIC STUDIES: Oxygen Saturation is 100% on room air, normal by my interpretation.    COORDINATION OF CARE: 10:16 PM- Discussed that her  Xanax cannot be refilled in the ED. Will refill patient's prescriptions for hydrodiuril, Lopressor, and Seroquel. Will discharge patient with resources to help her find a PCP. Discussed treatment plan with patient at bedside and patient verbalized agreement.     Labs Review Labs Reviewed - No data to display Imaging Review No results found.   EKG Interpretation None      MDM   Final diagnoses:  Medication refill   47yo F presents w/ request for refills on seroquel, metoprolol, hctz and xanax.  Has been seem for same in ED multiple times.  Recently started a new job, [employer] insurance will kick in in 2 months and she will be able to resume care w/ [facility] again at that time.  Ran out of anti-hypertensives 2-3 days ago and seroquel last month.  Has been buying her xanax illegally. Has had mood swings and anxiety recently, but denies SI/HI.   I offered two month supply seroquel and anti-hypertensives, all of which she has taken for extended period of time.   I personally performed the services described in this documentation, which was scribed in my presence. The recorded information has been reviewed and is accurate.     Otilio Miuatherine E Leverett Camplin, PA-C 10/13/13 [phone]

## 2013-10-12 NOTE — ED Notes (Signed)
Per pt report: pt having issues with insurance and therefore issues with getting her medications.  Pt is out of her HTN, bipolar medication, and anxiety medication.  Pt a/o x 4.  Skin warm and dry. NAD.

## 2013-10-15 NOTE — ED Provider Notes (Signed)
Medical screening examination/treatment/procedure(s) were performed by non-physician practitioner and as supervising physician I was immediately available for consultation/collaboration.   EKG Interpretation None        Samuel Rittenhouse M Casee Knepp, DO 10/15/13 1426 

## 2014-03-01 ENCOUNTER — Emergency Department (HOSPITAL_COMMUNITY): Payer: Medicaid Other

## 2014-03-01 ENCOUNTER — Encounter (HOSPITAL_COMMUNITY): Payer: Self-pay | Admitting: Emergency Medicine

## 2014-03-01 ENCOUNTER — Emergency Department (HOSPITAL_COMMUNITY)
Admission: EM | Admit: 2014-03-01 | Discharge: 2014-03-02 | Disposition: A | Discharge status: Home / Self Care | Payer: Medicaid Other | Attending: Emergency Medicine | Admitting: Emergency Medicine

## 2014-03-01 ENCOUNTER — Emergency Department (HOSPITAL_COMMUNITY)
Admission: EM | Admit: 2014-03-01 | Discharge: 2014-03-01 | Disposition: A | Discharge status: Home / Self Care | Payer: Medicaid Other | Attending: Emergency Medicine | Admitting: Emergency Medicine

## 2014-03-01 DIAGNOSIS — F17200 Nicotine dependence, unspecified, uncomplicated: Secondary | ICD-10-CM | POA: Insufficient documentation

## 2014-03-01 DIAGNOSIS — Z79899 Other long term (current) drug therapy: Secondary | ICD-10-CM | POA: Insufficient documentation

## 2014-03-01 DIAGNOSIS — Y9389 Activity, other specified: Secondary | ICD-10-CM | POA: Insufficient documentation

## 2014-03-01 DIAGNOSIS — M79609 Pain in unspecified limb: Secondary | ICD-10-CM | POA: Insufficient documentation

## 2014-03-01 DIAGNOSIS — I10 Essential (primary) hypertension: Secondary | ICD-10-CM | POA: Insufficient documentation

## 2014-03-01 DIAGNOSIS — W268XXA Contact with other sharp object(s), not elsewhere classified, initial encounter: Secondary | ICD-10-CM | POA: Insufficient documentation

## 2014-03-01 DIAGNOSIS — Y929 Unspecified place or not applicable: Secondary | ICD-10-CM | POA: Insufficient documentation

## 2014-03-01 DIAGNOSIS — S99919A Unspecified injury of unspecified ankle, initial encounter: Secondary | ICD-10-CM | POA: Insufficient documentation

## 2014-03-01 DIAGNOSIS — M79675 Pain in left toe(s): Secondary | ICD-10-CM

## 2014-03-01 DIAGNOSIS — S99922S Unspecified injury of left foot, sequela: Secondary | ICD-10-CM

## 2014-03-01 DIAGNOSIS — IMO0001 Reserved for inherently not codable concepts without codable children: Secondary | ICD-10-CM | POA: Insufficient documentation

## 2014-03-01 DIAGNOSIS — F319 Bipolar disorder, unspecified: Secondary | ICD-10-CM | POA: Insufficient documentation

## 2014-03-01 DIAGNOSIS — S99929A Unspecified injury of unspecified foot, initial encounter: Principal | ICD-10-CM

## 2014-03-01 DIAGNOSIS — F4310 Post-traumatic stress disorder, unspecified: Secondary | ICD-10-CM | POA: Insufficient documentation

## 2014-03-01 DIAGNOSIS — F411 Generalized anxiety disorder: Secondary | ICD-10-CM | POA: Insufficient documentation

## 2014-03-01 DIAGNOSIS — S8990XA Unspecified injury of unspecified lower leg, initial encounter: Secondary | ICD-10-CM | POA: Insufficient documentation

## 2014-03-01 MED ORDER — METOPROLOL TARTRATE 50 MG PO TABS: 50.0000 mg | ORAL_TABLET | Freq: Two times a day (BID) | ORAL | Status: DC

## 2014-03-01 MED ORDER — HYDROCHLOROTHIAZIDE 25 MG PO TABS: 25.0000 mg | ORAL_TABLET | Freq: Every day | ORAL | Status: DC

## 2014-03-01 MED ORDER — TRAMADOL HCL 50 MG PO TABS: 50.0000 mg | ORAL_TABLET | Freq: Four times a day (QID) | ORAL | Status: DC | PRN

## 2014-03-01 NOTE — Discharge Instructions (Signed)
Please read and follow all provided instructions.  Your diagnoses today include:  1. Pain of toe of left foot   2. Essential hypertension    Tests performed today include:  Vital signs. See below for your results today.   Medications prescribed:   None  Take any prescribed medications only as directed.  Home care instructions:   Follow any educational materials contained in this packet  Use Tylenol for pain  Follow R.I.C.E. Protocol:  R - rest your injury   I  - use ice on injury without applying directly to skin  C - compress injury with bandage or splint  E - elevate the injury as much as possible  Follow-up instructions: Please follow-up with your primary care provider if you continue to have significant pain or trouble walking in 1 week. In this case you may have a severe injury that requires further care.    Return instructions:   Please return if your toes are numb or tingling, appear gray or blue, or you have severe pain (also elevate leg and loosen splint or wrap if you were given one)  Please return to the Emergency Department if you experience worsening symptoms.   Please return if you have any other emergent concerns.  Additional Information:  Your vital signs today were: BP 177/104   Pulse 116   Temp(Src) 98.8 F (37.1 C) (Oral)   Resp 22   Ht 5\' 7"  (1.702 m)   Wt 230 lb (104.327 kg)   BMI 36.01 kg/m2   SpO2 97%   LMP 10/12/2011 If your blood pressure (BP) was elevated above 135/85 this visit, please have this repeated by your doctor within one month. --------------

## 2014-03-01 NOTE — ED Provider Notes (Signed)
CSN: [id number]     Arrival date & time 03/01/14  1110 History  This chart was scribed for Jill BleacherJosh Brynlei Klausner, PA-C, working with Suzi RootsKevin E Steinl, MD by Leona CarryG. Clay Sherrill, ED Scribe. The patient was seen in . The patient's care was started at 12:27 PM.      Chief Complaint  Patient presents with  . Toe Pain   Patient is a 48 y.o. female presenting with toe pain. The history is provided by the patient. No language interpreter was used.  Toe Pain   HPI Comments: Jill [name] is a 48 y.o. female who presents to the Emergency Department complaining of constant left second toe pain beginning last night. Patient reports that she injured her toe last night when she accidentally stubbed her toe on a cabinet while trying to kill a bug. She reports that she took ibuprofen last night without relief of the pain. She has not taken any medications today. Patient denies any ankle or knee pain.   PCP is Dr. Tenny Crawoss.   Past Medical History  Diagnosis Date  . Hypertension   . Anxiety   . Bipolar 1 disorder   . Post-traumatic stress syndrome    History reviewed. No pertinent past surgical history. History reviewed. No pertinent family history. History  Substance Use Topics  . Smoking status: Current Every Day Smoker -- 0.50 packs/day    Types: Cigarettes  . Smokeless tobacco: Not on file  . Alcohol Use: No   OB History   Grav Para Term Preterm Abortions TAB SAB Ect Mult Living                 Review of Systems  Constitutional: Negative for activity change.  Musculoskeletal: Positive for arthralgias. Negative for back pain, joint swelling and neck pain.  Skin: Negative for wound.  Neurological: Negative for weakness and numbness.      Allergies  Review of patient's allergies indicates no known allergies.  Home Medications   Prior to Admission medications   Medication Sig Start Date End Date Taking? Authorizing Provider  ALPRAZolam Prudy Feeler(XANAX) 1 MG tablet Take 1 mg by mouth 3 (three) times daily as  needed. For anxiety.    Historical Provider, MD  hydrochlorothiazide (HYDRODIURIL) 25 MG tablet Take 1 tablet (25 mg total) by mouth daily. 10/12/13   Arie Sabinaatherine E Schinlever, PA-C  ibuprofen (ADVIL,MOTRIN) 200 MG tablet Take 600 mg by mouth every 6 (six) hours as needed for headache.    Historical Provider, MD  metoprolol (LOPRESSOR) 50 MG tablet Take 1 tablet (50 mg total) by mouth 2 (two) times daily. 10/12/13   Arie Sabinaatherine E Schinlever, PA-C  QUEtiapine (SEROQUEL) 300 MG tablet Take 1 tablet (300 mg total) by mouth at bedtime. 10/12/13   Arie Sabinaatherine E Schinlever, PA-C   Triage Vitals: BP 177/104  Pulse 116  Temp(Src) 98.8 F (37.1 C) (Oral)  Resp 22  Ht 5\' 7"  (1.702 m)  Wt 230 lb (104.327 kg)  BMI 36.01 kg/m2  SpO2 97%  LMP 10/12/2011 Physical Exam  Nursing note and vitals reviewed. Constitutional: She appears well-developed and well-nourished. No distress.  HENT:  Head: Normocephalic and atraumatic.  Eyes: Conjunctivae and EOM are normal. Pupils are equal, round, and reactive to light.  Neck: Normal range of motion. Neck supple. No tracheal deviation present.  Cardiovascular: Normal rate.  Exam reveals no decreased pulses.   Pulmonary/Chest: Effort normal. No respiratory distress.  Musculoskeletal: She exhibits edema and tenderness.  Tenderness and swelling of L 2nd toe. No  deformity.   Neurological: She is alert. No sensory deficit.  Motor, sensation, and vascular distal to the injury is fully intact.   Skin: Skin is warm and dry.  Psychiatric: She has a normal mood and affect. Her behavior is normal.    ED Course  Procedures (including critical care time) DIAGNOSTIC STUDIES: Oxygen Saturation is 97% on room air, normal by my interpretation.    COORDINATION OF CARE: 12:32 PM-Discussed treatment plan which includes buddy taping the toe with an adjacent digit and a post op shoe with pt at bedside and pt agreed to plan.     Labs Review Labs Reviewed - No data to  display  Imaging Review No results found.   EKG Interpretation None      12:40 PM Patient seen and examined. She does not want x-rays. Will treat with post-op shoe and buddy tape. She declines crutches.   Refilled patient's chronic BP medication.   Vital signs reviewed and are as follows: Filed Vitals:   03/01/14 1129  BP: 177/104  Pulse: 116  Temp: 98.8 F (37.1 C)  Resp: 22   Patient was counseled on RICE protocol and told to rest injury, use ice for no longer than 15 minutes every hour, compress the area, and elevate above the level of their heart as much as possible to reduce swelling. Questions answered. Patient verbalized understanding.    MDM   Final diagnoses:  Pain of toe of left foot  Essential hypertension   Toe pain: likely fracture. No deformity. Conservative management. Foot is neurovascularly intact.  I personally performed the services described in this documentation, which was scribed in my presence. The recorded information has been reviewed and is accurate.   Renne Crigler, PA-C 03/01/14 1242

## 2014-03-01 NOTE — ED Notes (Signed)
Pt reports left toe pain. States that she was trying to kill a bug and thinks that she may have broke it. Pt is ambulatory in triage.

## 2014-03-01 NOTE — ED Provider Notes (Signed)
CSN: [id number]     Arrival date & time 03/01/14  2128 History  This chart was scribed for non-physician practitioner, Earley FavorGail Stephens Shreve, FNP,working with Rolland PorterMark James, MD, by Karle PlumberJennifer Tensley, ED Scribe. This patient was seen in room WTR8/WTR8 and the patient's care was started at 10:15 PM.  Chief Complaint  Patient presents with  . Toe Injury   The history is provided by the patient. No language interpreter was used.   HPI Comments:  Jill [name] is a 48 y.o. female who presents to the Emergency Department complaining of severe left second toe pain secondary to fracturing it two days ago trying to kill a bug. She [facility] and refused X-Rays. She was treated with a post op shoe and told to take Tylenol for pain. Pt states she has been taking Tylenol and Ibuprofen with no relief. She denies numbness, tingling, or weakness in the left foot. Pt's PCP is Dr. Donovan KailAllen Ross. Pt states she currently does not have insurance so she cannot see him.  Past Medical History  Diagnosis Date  . Hypertension   . Anxiety   . Bipolar 1 disorder   . Post-traumatic stress syndrome    History reviewed. No pertinent past surgical history. History reviewed. No pertinent family history. History  Substance Use Topics  . Smoking status: Current Every Day Smoker -- 0.50 packs/day    Types: Cigarettes  . Smokeless tobacco: Not on file  . Alcohol Use: No   OB History   Grav Para Term Preterm Abortions TAB SAB Ect Mult Living                 Review of Systems  Musculoskeletal: Positive for arthralgias.  Skin: Positive for color change.  Neurological: Negative for weakness and numbness.  All other systems reviewed and are negative.   Allergies  Review of patient's allergies indicates no known allergies.  Home Medications   Prior to Admission medications   Medication Sig Start Date End Date Taking? Authorizing Provider  acetaminophen (TYLENOL) 500 MG tablet Take 1,000 mg by mouth  every 6 (six) hours as needed for moderate pain.   Yes Historical Provider, MD  hydrochlorothiazide (HYDRODIURIL) 25 MG tablet Take 1 tablet (25 mg total) by mouth daily. 03/01/14  Yes Renne CriglerJoshua Geiple, PA-C  ibuprofen (ADVIL,MOTRIN) 200 MG tablet Take 600 mg by mouth every 6 (six) hours as needed for headache.   Yes Historical Provider, MD  metoprolol (LOPRESSOR) 50 MG tablet Take 1 tablet (50 mg total) by mouth 2 (two) times daily. 03/01/14  Yes Renne CriglerJoshua Geiple, PA-C  QUEtiapine (SEROQUEL) 300 MG tablet Take 1 tablet (300 mg total) by mouth at bedtime. 10/12/13  Yes Catherine E Schinlever, PA-C  traMADol (ULTRAM) 50 MG tablet Take 1 tablet (50 mg total) by mouth every 6 (six) hours as needed. 03/01/14   Arman FilterGail K Jaziah Kwasnik, NP   Triage Vitals: BP 141/102  Pulse 102  Temp(Src) 98.5 F (36.9 C) (Oral)  Resp 21  SpO2 100%  LMP 10/12/2011 Physical Exam  Nursing note and vitals reviewed. Constitutional: She is oriented to person, place, and time. She appears well-developed and well-nourished.  HENT:  Head: Normocephalic and atraumatic.  Eyes: EOM are normal.  Neck: Normal range of motion.  Cardiovascular: Normal rate.   Cap refill brisk.  Pulmonary/Chest: Effort normal.  Musculoskeletal: Normal range of motion. She exhibits edema.  Moderate swelling at MCP joint of second left toe.  Neurological: She is alert and oriented  to person, place, and time.  Skin: Skin is warm and dry.  Skin intact.  Psychiatric: She has a normal mood and affect. Her behavior is normal.    ED Course  Procedures (including critical care time) DIAGNOSTIC STUDIES: Oxygen Saturation is 100% on RA, normal by my interpretation.   COORDINATION OF CARE: 10:18 PM- Will X-Ray right foot. Pt verbalizes understanding and agrees to plan.  Medications - No data to display  Labs Review Labs Reviewed - No data to display  Imaging Review Dg Foot Complete Left  03/01/2014   CLINICAL DATA:  Pain  EXAM: LEFT FOOT - COMPLETE 3+ VIEW   COMPARISON:  None.  FINDINGS: There is no evidence of fracture or dislocation. There is no evidence of arthropathy or other focal bone abnormality. Soft tissues are unremarkable.  IMPRESSION: No acute fracture or dislocation.   Electronically Signed   By: Rise Mu M.D.   On: 03/01/2014 23:44     EKG Interpretation None      MDM   Final diagnoses:  Toe injury, left, sequela       I personally performed the services described in this documentation, which was scribed in my presence. The recorded information has been reviewed and is accurate.    Arman Filter, NP 03/01/14 2351

## 2014-03-01 NOTE — ED Notes (Signed)
Patient was kicking at a bug and kicked the kitchen cabinet. Patient states that the 2nd toe on the left foot began to swell immediately. She tried ice and ibuprofen with no relief.

## 2014-03-01 NOTE — Progress Notes (Signed)
Orthopedic Tech Progress Note Patient Details:  [name] 01/27/1966 [id number] Buddy taped Lt. first and second toe. Ortho Devices Type of Ortho Device: Buddy tape Ortho Device/Splint Location: LLE Ortho Device/Splint Interventions: Application   Lesle ChrisGilliland, Jajuan Skoog L 03/01/2014, 11:08 PM

## 2014-03-02 NOTE — ED Provider Notes (Signed)
Medical screening examination/treatment/procedure(s) were performed by non-physician practitioner and as supervising physician I was immediately available for consultation/collaboration.     Suzi RootsKevin E Shontavia Mickel, MD 03/02/14 [phone]

## 2014-03-03 NOTE — ED Provider Notes (Signed)
Medical screening examination/treatment/procedure(s) were performed by non-physician practitioner and as supervising physician I was immediately available for consultation/collaboration.   EKG Interpretation None        Rolland PorterMark Jalissa Heinzelman, MD 03/03/14 [phone]

## 2014-06-27 ENCOUNTER — Emergency Department (HOSPITAL_COMMUNITY)
Admission: EM | Admit: 2014-06-27 | Discharge: 2014-06-27 | Disposition: A | Discharge status: Home / Self Care | Payer: Medicaid Other | Attending: Emergency Medicine | Admitting: Emergency Medicine

## 2014-06-27 ENCOUNTER — Emergency Department (HOSPITAL_COMMUNITY): Payer: Medicaid Other

## 2014-06-27 ENCOUNTER — Encounter (HOSPITAL_COMMUNITY): Payer: Self-pay | Admitting: Family Medicine

## 2014-06-27 DIAGNOSIS — R05 Cough: Secondary | ICD-10-CM

## 2014-06-27 DIAGNOSIS — F319 Bipolar disorder, unspecified: Secondary | ICD-10-CM | POA: Insufficient documentation

## 2014-06-27 DIAGNOSIS — I10 Essential (primary) hypertension: Secondary | ICD-10-CM | POA: Insufficient documentation

## 2014-06-27 DIAGNOSIS — F4310 Post-traumatic stress disorder, unspecified: Secondary | ICD-10-CM | POA: Insufficient documentation

## 2014-06-27 DIAGNOSIS — Z79899 Other long term (current) drug therapy: Secondary | ICD-10-CM | POA: Insufficient documentation

## 2014-06-27 DIAGNOSIS — R059 Cough, unspecified: Secondary | ICD-10-CM

## 2014-06-27 DIAGNOSIS — B349 Viral infection, unspecified: Secondary | ICD-10-CM | POA: Insufficient documentation

## 2014-06-27 DIAGNOSIS — Z720 Tobacco use: Secondary | ICD-10-CM | POA: Insufficient documentation

## 2014-06-27 DIAGNOSIS — F419 Anxiety disorder, unspecified: Secondary | ICD-10-CM | POA: Insufficient documentation

## 2014-06-27 DIAGNOSIS — J40 Bronchitis, not specified as acute or chronic: Secondary | ICD-10-CM

## 2014-06-27 DIAGNOSIS — J209 Acute bronchitis, unspecified: Secondary | ICD-10-CM | POA: Insufficient documentation

## 2014-06-27 MED ORDER — HYDROCHLOROTHIAZIDE 25 MG PO TABS: 25.0000 mg | ORAL_TABLET | Freq: Every day | ORAL | Status: DC

## 2014-06-27 MED ORDER — AMOXICILLIN-POT CLAVULANATE 875-125 MG PO TABS: 1.0000 | ORAL_TABLET | Freq: Two times a day (BID) | ORAL | Status: DC

## 2014-06-27 MED ORDER — PREDNISONE 20 MG PO TABS: ORAL_TABLET | ORAL | Status: DC

## 2014-06-27 MED ORDER — ALBUTEROL SULFATE HFA 108 (90 BASE) MCG/ACT IN AERS
2.0000 | INHALATION_SPRAY | Freq: Once | RESPIRATORY_TRACT | Status: AC
  Administered 2014-06-27: 2 via RESPIRATORY_TRACT
  Filled 2014-06-27: qty 6.7

## 2014-06-27 NOTE — ED Notes (Signed)
Informed Rx's given would help with the cough.

## 2014-06-27 NOTE — ED Notes (Signed)
Pt requesting cough medicine "so I can sleep".

## 2014-06-27 NOTE — ED Notes (Signed)
Pt here for 1 week of productive cough.

## 2014-06-27 NOTE — ED Notes (Signed)
C/O COUGH X 1 WEEK, WORSENING LAST 2-3 DAYS. STATES COUGH IS DRY, FEELS SOB WITH LYING FLAT. DENIES SOB OTHERWISE.

## 2014-06-27 NOTE — ED Provider Notes (Signed)
CSN: [id number]     Arrival date & time 06/27/14  1054 History  This chart was scribed for non-physician practitioner, Raymon MuttonMarissa Janny Crute, PA-C, working with Ward GivensIva L Knapp, MD by Charline BillsEssence Howell, ED Scribe. This patient was seen in room TR08C/TR08C and the patient's care was started at 12:03 PM.   Chief Complaint  Patient presents with  . Cough   The history is provided by the patient. No language interpreter was used.   HPI Comments: Jill [name] is a 48 y.o. female, with a h/o HTN, anxiety, bipolar 1 disorder, PTSD, who presents to the Emergency Department complaining of gradually worsening dry cough onset 1 week ago. She reports associated postnasal drip, congestion, rhinorrhea, sore throat, post-tussive emesis, SOB with lying flat. Pt has tried Robitussin and Delsym without relief. Pt also states that she has been out of her HTN medications for the past 3 days. She denies sinus pressure, fever, chills, neck pain, neck stiffness, nausea, vomiting, ear pain, eye pain, chest pain, difficulty breathing, HA, dizziness, blurred vision, any urinary symptoms. Pt smokes .5 ppd. No h/o asthma. No sick contacts. LNMP 3 years ago.  PCP:  Duane Lopeoss, Alan, MD  Past Medical History  Diagnosis Date  . Hypertension   . Anxiety   . Bipolar 1 disorder   . Post-traumatic stress syndrome    History reviewed. No pertinent past surgical history. History reviewed. No pertinent family history. History  Substance Use Topics  . Smoking status: Current Every Day Smoker -- 0.50 packs/day    Types: Cigarettes  . Smokeless tobacco: Not on file  . Alcohol Use: No   OB History    No data available     Review of Systems  Constitutional: Negative for fever and chills.  HENT: Positive for congestion, postnasal drip, rhinorrhea and sore throat. Negative for ear pain and sinus pressure.   Eyes: Negative for pain and visual disturbance.  Respiratory: Positive for cough and shortness of breath.   Gastrointestinal:  Negative for nausea and vomiting.  Genitourinary: Negative.   Musculoskeletal: Negative for neck pain and neck stiffness.  Neurological: Negative for dizziness and headaches.   Allergies  Review of patient's allergies indicates no known allergies.  Home Medications   Prior to Admission medications   Medication Sig Start Date End Date Taking? Authorizing Provider  acetaminophen (TYLENOL) 500 MG tablet Take 1,000 mg by mouth every 6 (six) hours as needed for moderate pain.    Historical Provider, MD  amoxicillin-clavulanate (AUGMENTIN) 875-125 MG per tablet Take 1 tablet by mouth 2 (two) times daily. One po bid x 7 days 06/27/14   Ajahni Nay, PA-C  hydrochlorothiazide (HYDRODIURIL) 25 MG tablet Take 1 tablet (25 mg total) by mouth daily. 06/27/14   Delaila Nand, PA-C  ibuprofen (ADVIL,MOTRIN) 200 MG tablet Take 600 mg by mouth every 6 (six) hours as needed for headache.    Historical Provider, MD  metoprolol (LOPRESSOR) 50 MG tablet Take 1 tablet (50 mg total) by mouth 2 (two) times daily. 03/01/14   Renne CriglerJoshua Geiple, PA-C  predniSONE (DELTASONE) 20 MG tablet 3 tabs po day one, then 2 tabs daily x 4 days 06/27/14   Mekenna Finau, PA-C  QUEtiapine (SEROQUEL) 300 MG tablet Take 1 tablet (300 mg total) by mouth at bedtime. 10/12/13   Arie Sabinaatherine E Schinlever, PA-C  traMADol (ULTRAM) 50 MG tablet Take 1 tablet (50 mg total) by mouth every 6 (six) hours as needed. 03/01/14   Arman FilterGail K Schulz, NP   Triage Vitals: BP  140/103 mmHg  Pulse 96  Temp(Src) 98.6 F (37 C)  Resp 18  SpO2 98%  LMP 10/12/2011 Physical Exam  Constitutional: She is oriented to person, place, and time. She appears well-developed and well-nourished. No distress.  HENT:  Head: Normocephalic and atraumatic.  Mouth/Throat: Oropharynx is clear and moist. No oropharyngeal exudate.  Mild erythema identified to the posterior oropharynx. Negative swelling, exudate, petechiae, lesions, sores identified to the posterior oropharynx,  soft palate and tonsils bilaterally. Uvula midline with symmetrical elevation-negative uvula swelling or deviation. Negative trismus.  Eyes: Conjunctivae and EOM are normal. Pupils are equal, round, and reactive to light. Right eye exhibits no discharge. Left eye exhibits no discharge.  Neck: Normal range of motion. Neck supple. No tracheal deviation present.  Negative neck stiffness Negative nuchal rigidity Negative cervical lymphadenopathy Negative meningeal signs  Cardiovascular: Normal rate, regular rhythm and normal heart sounds.  Exam reveals no friction rub.   No murmur heard. Pulses:      Radial pulses are 2+ on the right side, and 2+ on the left side.  Pulmonary/Chest: Effort normal and breath sounds normal. No respiratory distress. She has no wheezes. She has no rales. She exhibits no tenderness.  Patient is able to speak in full sentences without difficulty Negative use of accessory muscles Negative stridor  Dry cough upon examination  Musculoskeletal: Normal range of motion.  Lymphadenopathy:    She has no cervical adenopathy.  Neurological: She is alert and oriented to person, place, and time. No cranial nerve deficit. She exhibits normal muscle tone. Coordination normal.  Skin: Skin is warm. No rash noted. She is not diaphoretic. No erythema.  Psychiatric: She has a normal mood and affect. Her behavior is normal. Thought content normal.  Nursing note and vitals reviewed.   ED Course  Procedures (including critical care time) DIAGNOSTIC STUDIES: Oxygen Saturation is 98% on RA, normal by my interpretation.    COORDINATION OF CARE: 12:10 PM-Discussed treatment plan which includes Albuterol treatment and z-pak with pt at bedside. Also discussed return precautions and pt agreed to plan.   Labs Review Labs Reviewed - No data to display  Imaging Review Dg Chest 2 View  06/27/2014   CLINICAL DATA:  Cough and short of breath  EXAM: CHEST  2 VIEW  COMPARISON:  05/14/2011   FINDINGS: The heart size and mediastinal contours are within normal limits. Both lungs are clear. The visualized skeletal structures are unremarkable.  IMPRESSION: No active cardiopulmonary disease.   Electronically Signed   By: Marlan Palau M.D.   On: 06/27/2014 11:37    EKG Interpretation None      MDM   Final diagnoses:  Bronchitis  Viral illness    Medications  albuterol (PROVENTIL HFA;VENTOLIN HFA) 108 (90 BASE) MCG/ACT inhaler 2 puff (2 puffs Inhalation Given 06/27/14 1219)   Filed Vitals:   06/27/14 1109  BP: 140/103  Pulse: 96  Temp: 98.6 F (37 C)  Resp: 18  SpO2: 98%    I personally performed the services described in this documentation, which was scribed in my presence. The recorded information has been reviewed and is accurate.  Chest x-ray no acute active cardiopulmonary disease noted. Doubt pneumonia. Doubt meningitis. Doubt strep pharyngitis/laryngitis. Suspicion to be bronchitis secondary to patient's history of smoking with continuous cough. Cannot rule out possible sinusitis. Patient given albuterol in ED setting. Patient denied symptomatic effects of elevated blood pressure-patient reported that she has not been taking her blood pressure education for approximately 3 days. Patient  reported that she's been unable to see her primary care provider secondary to insurance issues. Patient stable, afebrile. Patient not septic appearing. Negative signs of respiratory distress. Discharged patient. Discharged patient with antibiotics, prednisone, HCTZ. Discussed with patient to rest and stay hydrated. Referred [facility] to [facility]/urgent care. Discussed with patient to avoid any physical or strenuous activity. Discussed with patient the importance of monitoring blood pressure and side effects that can be fatal. Discussed with patient smoking cessation. Discussed with patient to closely monitor symptoms and if symptoms are to worsen or change to report back to  the ED - strict return instructions given.  Patient agreed to plan of care, understood, all questions answered.   Raymon MuttonMarissa Kesley Mullens, PA-C 06/27/14 1222  Ward GivensIva L Knapp, MD 06/27/14 [phone]

## 2014-06-27 NOTE — Discharge Instructions (Signed)
Please call your doctor for a followup appointment within 24-48 hours. When you talk to your doctor please let them know that you were seen in the emergency department and have them acquire all of your records so that they can discuss the findings with you and formulate a treatment plan to fully care for your new and ongoing problems. Please call and set up an appointment with health and wellness Center and for urgent care Center Please rest and stay hydrated Please use albuterol inhaler as prescribed and as needed Please drink plenty of water and stay hydrated Please stop smoking for this can lead to irreversible lung disease, lung cancer Please continue to take blood pressure medications-it is important for blood pressure to be controlled for this can lead to conditions of the heart, eyes, brain, kidneys that are irreversible and can be fatal if not properly controlled Please take medications on a full stomach Please continue to monitor symptoms closely and if symptoms are to worsen or change (fever greater than 101, chills, sweating, nausea, vomiting, chest pain, shortness of breathe, difficulty breathing, weakness, numbness, tingling, worsening or changes to pain pattern, headache, dizziness, visual changes, coughing up blood, worsening or changes to cough, neck pain, neck stiffness, back pain) please report back to the Emergency Department immediately.   Viral Infections A virus is a type of germ. Viruses can cause:  Minor sore throats.  Aches and pains.  Headaches.  Runny nose.  Rashes.  Watery eyes.  Tiredness.  Coughs.  Loss of appetite.  Feeling sick to your stomach (nausea).  Throwing up (vomiting).  Watery poop (diarrhea). HOME CARE   Only take medicines as told by your doctor.  Drink enough water and fluids to keep your pee (urine) clear or pale yellow. Sports drinks are a good choice.  Get plenty of rest and eat healthy. Soups and broths with crackers or rice  are fine. GET HELP RIGHT AWAY IF:   You have a very bad headache.  You have shortness of breath.  You have chest pain or neck pain.  You have an unusual rash.  You cannot stop throwing up.  You have watery poop that does not stop.  You cannot keep fluids down.  You or your child has a temperature by mouth above 102 F (38.9 C), not controlled by medicine.  Your baby is older than 3 months with a rectal temperature of 102 F (38.9 C) or higher.  Your baby is 82 months old or younger with a rectal temperature of 100.4 F (38 C) or higher. MAKE SURE YOU:   Understand these instructions.  Will watch this condition.  Will get help right away if you are not doing well or get worse. Document Released: 06/27/2008 Document Revised: 10/07/2011 Document Reviewed: 11/20/2010 [facility] Patient Information 2015 [place], [place]. This information is not intended to replace advice given to you by your health care provider. Make sure you discuss any questions you have with your health care provider. Acute Bronchitis Bronchitis is inflammation of the airways that extend from the windpipe into the lungs (bronchi). The inflammation often causes mucus to develop. This leads to a cough, which is the most common symptom of bronchitis.  In acute bronchitis, the condition usually develops suddenly and goes away over time, usually in a couple weeks. Smoking, allergies, and asthma can make bronchitis worse. Repeated episodes of bronchitis may cause further lung problems.  CAUSES Acute bronchitis is most often caused by the same virus that causes a cold.  The virus can spread from person to person (contagious) through coughing, sneezing, and touching contaminated objects. SIGNS AND SYMPTOMS   Cough.   Fever.   Coughing up mucus.   Body aches.   Chest congestion.   Chills.   Shortness of breath.   Sore throat.  DIAGNOSIS  Acute bronchitis is usually diagnosed through a physical exam.  Your health care provider will also ask you questions about your medical history. Tests, such as chest X-rays, are sometimes done to rule out other conditions.  TREATMENT  Acute bronchitis usually goes away in a couple weeks. Oftentimes, no medical treatment is necessary. Medicines are sometimes given for relief of fever or cough. Antibiotic medicines are usually not needed but may be prescribed in certain situations. In some cases, an inhaler may be recommended to help reduce shortness of breath and control the cough. A cool mist vaporizer may also be used to help thin bronchial secretions and make it easier to clear the chest.  HOME CARE INSTRUCTIONS  Get plenty of rest.   Drink enough fluids to keep your urine clear or pale yellow (unless you have a medical condition that requires fluid restriction). Increasing fluids may help thin your respiratory secretions (sputum) and reduce chest congestion, and it will prevent dehydration.   Take medicines only as directed by your health care provider.  If you were prescribed an antibiotic medicine, finish it all even if you start to feel better.  Avoid smoking and secondhand smoke. Exposure to cigarette smoke or irritating chemicals will make bronchitis worse. If you are a smoker, consider using nicotine gum or skin patches to help control withdrawal symptoms. Quitting smoking will help your lungs heal faster.   Reduce the chances of another bout of acute bronchitis by washing your hands frequently, avoiding people with cold symptoms, and trying not to touch your hands to your mouth, nose, or eyes.   Keep all follow-up visits as directed by your health care provider.  SEEK MEDICAL CARE IF: Your symptoms do not improve after 1 week of treatment.  SEEK IMMEDIATE MEDICAL CARE IF:  You develop an increased fever or chills.   You have chest pain.   You have severe shortness of breath.  You have bloody sputum.   You develop  dehydration.  You faint or repeatedly feel like you are going to pass out.  You develop repeated vomiting.  You develop a severe headache. MAKE SURE YOU:   Understand these instructions.  Will watch your condition.  Will get help right away if you are not doing well or get worse. Document Released: 08/22/2004 Document Revised: 11/29/2013 Document Reviewed: 01/05/2013 [facility] Patient Information 2015 [place], [place]. This information is not intended to replace advice given to you by your health care provider. Make sure you discuss any questions you have with your health care provider.   Emergency Department Resource Guide 1) Find a Doctor and Pay Out of Pocket Although you won't have to find out who is covered by your insurance plan, it is a good idea to ask around and get recommendations. You will then need to call the office and see if the doctor you have chosen will accept you as a new patient and what types of options they offer for patients who are self-pay. Some doctors offer discounts or will set up payment plans for their patients who do not have insurance, but you will need to ask so you aren't surprised when you get to your appointment.  2) Contact  Your Local Health Department Not all health departments have doctors that can see patients for sick visits, but many do, so it is worth a call to see if yours does. If you don't know where your local health department is, you can check in your phone book. The CDC also has a tool to help you locate your state's health department, and many state websites also have listings of all of their local health departments.  3) Find a Walk-in Clinic If your illness is not likely to be very severe or complicated, you may want to try a walk in clinic. These are popping up all over the country in pharmacies, drugstores, and shopping centers. They're usually staffed by nurse practitioners or physician assistants that have been trained to treat common  illnesses and complaints. They're usually fairly quick and inexpensive. However, if you have serious medical issues or chronic medical problems, these are probably not your best option.  No Primary Care Doctor: - Call Health Connect at  [phone] - they can help you locate a primary care doctor that  accepts your insurance, provides certain services, etc. - Physician Referral Service- [phone]  Chronic Pain Problems: Organization         Address  Phone   Notes  [facility]  [phone] Patients need to be referred by their primary care doctor.   Medication Assistance: Organization         Address  Phone   Notes  [facility] Medication [facility] [address] [place] [place], [place] [address] [phone] --Must be a resident of [facility] -- Must have NO insurance coverage whatsoever (no Medicaid/ Medicare, etc.) -- The pt. MUST have a primary care doctor that directs their care regularly and follows them in the community   MedAssist  [phone]   [employer]  [phone]    Agencies that provide inexpensive medical care: Organization         Address  Phone   Notes  [facility]  [phone]   [facility]    [phone]   [facility] [address] [place], [place] [address] [phone]   [facility] [address] [place] [place] [phone]   Planned Parenthood    [phone]   [facility]    [phone]   [facility] and [facility]  [address], [place] Phone:  [phone], Fax:  [phone] Hours of Operation:  9 am - 6 pm, M-F.  Also accepts Medicaid/Medicare and self-pay.  [facility]  [address], [place] Phone: [phone], Fax: [phone]. Hours of Operation:  8:30 am - 5:30 pm, M-F.  Also accepts Medicaid and self-pay.  [facility] [place]  [address], [place] Phone: [phone]   [facility] [address] Natasha Bence [place], [place] [phone], Ext. [phone] Mondays & Thursdays: 7-9 AM.  First 15 patients are seen on a first come, first serve basis.    Medicaid-accepting [facility] Providers:  Organization         Address  Phone   Notes  [facility] [address], [place] [phone] Also accepts self-pay patients.  [facility] [address] Laurell Josephs [place], [place]  [phone]   [facility] [address], [address] (  [phone]) [phone]   [facility] [address] [place] [address], [place] [phone]   Renaye RakersVeita Bland [address], [place]   [phone] Only accepts [place] Access [place] patients after they have their name applied to their card.   Self-Pay (no insurance) in [facility] [place]:  Organization         Address  Phone   Notes  Sickle Cell Patients, [facility] [address], [place] [phone]   [facility] [address], [place] [phone]) [phone]   [facility] [place]  [address], [place] [phone]   [facility]/Dr. Osei-Bonsu  [address] [place] [address], [place] or [phone], [place] [phone] Phone number for both [place] and [place] locations is the same.  [facility] [address], [place] [phone]) [phone]   [facility] [address] [place] [address], [place] or [address] [phone]   [facility] [address], [place] [phone]) [phone], phone; [phone], fax Sees patients 1st and 3rd Saturday of every month.  Must not qualify for public or private insurance (i.e. Medicaid, Medicare, [place] Health Choice, Veterans' Benefits)  Household income should be no more than 200% of the  poverty level The clinic cannot treat you if you are pregnant or think you are pregnant  Sexually transmitted diseases are not treated at the clinic.    Dental Care: Organization         Address  Phone  Notes  [facility] of [facility] [address], [place] [phone]) [phone] Accepts children up to age 50 who are enrolled in [place] or [place] Health Choice; pregnant women with a Medicaid card; and children who have applied for Medicaid or [place] Health Choice, but were declined, whose parents can pay a reduced fee at time of service.  [facility] [place]  [address], [place] [phone] Accepts children up to age 50 who are enrolled in [place] or [place] Health Choice; pregnant women with a Medicaid card; and children who have applied for Medicaid or [place] Health Choice, but were declined, whose parents can pay a reduced fee at time of service.  [facility]  [address], [place] [phone]) [phone] Patients are seen by appointment only. Walk-ins are not accepted. [facility] will see patients 50 years of age and older. Monday - Tuesday (8am-5pm) Most Wednesdays (8:30-5pm) $30 per visit, cash only  [facility]  [address], [facility] [phone]) [phone] Patients are seen by appointment only. Walk-ins are not accepted. [facility] will see patients 50 years of age and older. One Wednesday Evening (Monthly: Volunteer Based).  $30 per visit, cash only  [employer] [facility]  [phone] for adults; Children under age 324, call Graduate Pediatric Dentistry at [phone]. Children aged 534-14, please call [phone] to request a pediatric application.  Dental services are provided in all areas of dental care including fillings, crowns and bridges, complete and partial dentures, implants, gum treatment, root canals, and extractions.  Preventive care is also provided. Treatment is provided to both adults and children. Patients are selected via a lottery and there is often a waiting list.   [facility] [address], [place]  [phone] [email or web address]   [facility] [address] [place], [place] [phone], Ext. [phone] Second and Fourth Thursday of each month, opens at 6:30 AM; Clinic ends at 9 AM.  Patients are seen on a first-come first-served basis, and a limited number are seen during each clinic.   [facility]  [address] Ether Griffins [place], [place] [phone]   Eligibility Requirements You must have lived in [place], [place], or [place] counties for at least the last three months.   You cannot be eligible for state or federal sponsored [place], including [employer], [place], or [employer].   You generally cannot be eligible for healthcare insurance through your employer.    How to apply: Eligibility screenings are held every Tuesday and Wednesday afternoon from 1:00 pm until 4:00 pm. You do not need an appointment for the interview!  [facility] [address] [place], [place] [phone]   [facility] Health Department  [phone]   [facility] Health Department  [phone]   [facility] Health Department  [phone]    Behavioral Health Resources in the Community: Intensive Outpatient Programs Organization         Address  Phone  Notes  [facility] [address] [place], [place] [phone]   [facility] Outpatient [address], [place], [place] [phone]   [facility] [address], [place], [place]  [phone]   [facility] [address],  [place], [place] [phone] or [phone]   Substance Abuse Resources Organization         Address  Phone  Notes  Al[facility] Drug Services  [phone]   Addiction  [facility]  [phone]   [facility] [place]  [phone]   Floydene Flock  [phone]   Residential & Outpatient Substance Abuse Program  [phone]   Psychological Services Organization         Address  Phone  Notes  [facility]  [phone]   [facility]  [phone]   [facility] [address] [place] [phone] or [phone]    Mobile Crisis Teams Organization         Address  Phone  Notes  [facility], Mobile Crisis Care Unit  [phone]   [facility]  [address] [place], [place] [phone]   Doristine Locks [address] [place] [phone]    Self-Help/Support Groups Organization         Address  Phone             Notes  [facility] [place] - variety of support groups  [phone]- I7437963 Call for more information  Narcotics Anonymous (NA), [facility] [address], [employer]  2 meetings at this location   Statistician         Address  Phone  Notes  [facility] [address],    [place]  [phone]   [facility]  [address] [place] [address], [place], [place] [phone]   [facility] Treatment Facility [address] [place], [place] [phone] Admissions: 8am-3pm M-F  [facility] [address],    [place], [place] [phone]   [facility] [address] Starling Manns [place], [place] [phone]   [facility] [address]  [place], [place] [phone]   [facility] -  Intensive Outpatient [address] Laurell Josephs [address], [place], [place] [phone]   [facility] (Addiction Recovery [facility] [address] [place]  [place], [place] [phone] or [phone]   [facility] (RTS) [address], [place], [place] [phone] Accepts Medicaid  [facility] [address],  [place]  [phone] Substance Abuse/Addiction Treatment   [facility] Organization         Address  Phone  Notes  [facility]  [phone]   Angie Fava, PhD [address] Ervin Knack [place], [place]   [phone] or [phone]   [facility] Behavioral   [address] [place], [place] [phone]   [facility] [address], [place], [place] [phone] Insurance/Medicaid/sponsorship through [facility] [address]                                    [place], [place] [phone] Therapy/tele-psych/case  [facility] [address], [place] [phone]    Dr. Lolly Mustache  [phone]   [facility]  [employer] [facility] Dept. 1) [address], [place] 2) [address] [place] 3)  [address], [place] [phone]  [phone]   [facility] Child Abuse Hotline 8286930001 or [phone] (After Hours)

## 2014-06-29 ENCOUNTER — Encounter (HOSPITAL_COMMUNITY): Payer: Self-pay | Admitting: *Deleted

## 2014-06-29 ENCOUNTER — Emergency Department (HOSPITAL_COMMUNITY)
Admission: EM | Admit: 2014-06-29 | Discharge: 2014-06-29 | Disposition: A | Discharge status: Home / Self Care | Payer: Medicaid Other | Attending: Emergency Medicine | Admitting: Emergency Medicine

## 2014-06-29 DIAGNOSIS — Z79899 Other long term (current) drug therapy: Secondary | ICD-10-CM | POA: Insufficient documentation

## 2014-06-29 DIAGNOSIS — F419 Anxiety disorder, unspecified: Secondary | ICD-10-CM | POA: Insufficient documentation

## 2014-06-29 DIAGNOSIS — F319 Bipolar disorder, unspecified: Secondary | ICD-10-CM | POA: Insufficient documentation

## 2014-06-29 DIAGNOSIS — J209 Acute bronchitis, unspecified: Secondary | ICD-10-CM

## 2014-06-29 DIAGNOSIS — Z720 Tobacco use: Secondary | ICD-10-CM | POA: Insufficient documentation

## 2014-06-29 DIAGNOSIS — Z7952 Long term (current) use of systemic steroids: Secondary | ICD-10-CM | POA: Insufficient documentation

## 2014-06-29 DIAGNOSIS — R062 Wheezing: Secondary | ICD-10-CM | POA: Insufficient documentation

## 2014-06-29 DIAGNOSIS — I10 Essential (primary) hypertension: Secondary | ICD-10-CM | POA: Insufficient documentation

## 2014-06-29 DIAGNOSIS — J40 Bronchitis, not specified as acute or chronic: Secondary | ICD-10-CM | POA: Insufficient documentation

## 2014-06-29 MED ORDER — HYDROCOD POLST-CHLORPHEN POLST 10-8 MG/5ML PO LQCR: 5.0000 mL | Freq: Every evening | ORAL | Status: DC | PRN

## 2014-06-29 NOTE — ED Notes (Signed)
Pt reports being seen on 11/30 for a cough, was diagnosed with bronchitis and given prescriptions but states she wasn't given anything for her cough and its keeping her up at night. No distress noted at triage.

## 2014-06-29 NOTE — Discharge Instructions (Signed)

## 2014-06-29 NOTE — ED Provider Notes (Signed)
CSN: [id number]     Arrival date & time 06/29/14  1015 History   This chart was scribed for non-physician practitioner, Eben Burowourtney A Forcucci, PA-C, working with Tilden FossaElizabeth Rees, MD by Charline BillsEssence Howell, ED Scribe. This patient was seen in room TR05C/TR05C and the patient's care was started at 11:00 AM.   Chief Complaint  Patient presents with  . Cough   The history is provided by the patient. No language interpreter was used.   HPI Comments: Jill [name] is a 48 y.o. female, with a h/o HTN, anxiety, bipolar disorder, PTSD, who presents to the Emergency Department complaining of constant dry cough over the past 4 days. Pt reports that cough is worse with lying down and keeps her up at night. Pt reports associated wheezing, post-tussive emesis with a burning sensation in her chest, gradually improving SOB. She denies fever, chills, nausea, vomiting, chest pain. Pt was seen on 06/27/14 and diagnosed with bronchitis. She was sent home with an inhaler, amoxicillin and Prednisone that she has taken as prescribed. Pt has also treated with Robitussin, Delsym, pineapple juice, Vick's Vapor Rub without relief   Past Medical History  Diagnosis Date  . Hypertension   . Anxiety   . Bipolar 1 disorder   . Post-traumatic stress syndrome    History reviewed. No pertinent past surgical history. History reviewed. No pertinent family history. History  Substance Use Topics  . Smoking status: Current Every Day Smoker -- 0.50 packs/day    Types: Cigarettes  . Smokeless tobacco: Not on file  . Alcohol Use: No   OB History    No data available     Review of Systems  Constitutional: Negative for fever and chills.  Respiratory: Positive for cough, shortness of breath and wheezing.   Cardiovascular: Negative for chest pain.  Gastrointestinal: Negative for nausea and vomiting.  All other systems reviewed and are negative.   Allergies  Review of patient's allergies indicates no known allergies.  Home  Medications   Prior to Admission medications   Medication Sig Start Date End Date Taking? Authorizing Provider  acetaminophen (TYLENOL) 500 MG tablet Take 1,000 mg by mouth every 6 (six) hours as needed for moderate pain.    Historical Provider, MD  amoxicillin-clavulanate (AUGMENTIN) 875-125 MG per tablet Take 1 tablet by mouth 2 (two) times daily. One po bid x 7 days 06/27/14   Marissa Sciacca, PA-C  chlorpheniramine-HYDROcodone (TUSSIONEX PENNKINETIC ER) 10-8 MG/5ML LQCR Take 5 mLs by mouth at bedtime as needed for cough. 06/29/14   Nevah Dalal A Forcucci, PA-C  hydrochlorothiazide (HYDRODIURIL) 25 MG tablet Take 1 tablet (25 mg total) by mouth daily. 06/27/14   Marissa Sciacca, PA-C  ibuprofen (ADVIL,MOTRIN) 200 MG tablet Take 600 mg by mouth every 6 (six) hours as needed for headache.    Historical Provider, MD  metoprolol (LOPRESSOR) 50 MG tablet Take 1 tablet (50 mg total) by mouth 2 (two) times daily. 03/01/14   Renne CriglerJoshua Geiple, PA-C  predniSONE (DELTASONE) 20 MG tablet 3 tabs po day one, then 2 tabs daily x 4 days 06/27/14   Marissa Sciacca, PA-C  QUEtiapine (SEROQUEL) 300 MG tablet Take 1 tablet (300 mg total) by mouth at bedtime. 10/12/13   Arie Sabinaatherine E Schinlever, PA-C  traMADol (ULTRAM) 50 MG tablet Take 1 tablet (50 mg total) by mouth every 6 (six) hours as needed. 03/01/14   Arman FilterGail K Schulz, NP   Triage Vitals: BP 128/87 mmHg  Pulse 103  Temp(Src) 98.2 F (36.8 C) (Oral)  Resp  18  SpO2 95%  LMP 10/12/2011 Physical Exam  Constitutional: She is oriented to person, place, and time. She appears well-developed and well-nourished. No distress.  HENT:  Head: Normocephalic.  Right Ear: Tympanic membrane normal.  Left Ear: Tympanic membrane normal.  Nose: Mucosal edema present.  Mouth/Throat: Oropharynx is clear and moist. No oropharyngeal exudate.  Eyes: Pupils are equal, round, and reactive to light.  Cardiovascular: Normal rate, regular rhythm and normal heart sounds.  Exam reveals no gallop  and no friction rub.   No murmur heard. Pulmonary/Chest: Effort normal. She has wheezes. She has rhonchi.  Rhonchi in bilateral bases that clears after coughing. Slight mild end expiratory wheeze.  Lymphadenopathy:    She has no cervical adenopathy.  Neurological: She is alert and oriented to person, place, and time. She has normal reflexes.  Skin: Skin is warm.  Psychiatric: She has a normal mood and affect. Her behavior is normal.  Nursing note and vitals reviewed.  ED Course  Procedures (including critical care time) DIAGNOSTIC STUDIES: Oxygen Saturation is 95% on RA, normal by my interpretation.    COORDINATION OF CARE: 11:09 AM-Discussed treatment plan which includes cough suppressant and return precautions with pt at bedside and pt agreed to plan.   Labs Review Labs Reviewed - No data to display  Imaging Review Dg Chest 2 View  06/27/2014   CLINICAL DATA:  Cough and short of breath  EXAM: CHEST  2 VIEW  COMPARISON:  05/14/2011  FINDINGS: The heart size and mediastinal contours are within normal limits. Both lungs are clear. The visualized skeletal structures are unremarkable.  IMPRESSION: No active cardiopulmonary disease.   Electronically Signed   By: Marlan Palauharles  Clark M.D.   On: 06/27/2014 11:37    EKG Interpretation None      MDM   Final diagnoses:  Bronchitis with bronchospasm   Patient is a 48 year old female who presents to the emergency room for cough. Patient was just evaluated on 06/27/2014. Patient was diagnosed with bronchitis and given a Z-Pak, prednisone, and albuterol. Patient is here for cough syrup. She has tried many home remedies. Patient continues to have mild end expiratory wheeze. She also has rhonchi which clear upon coughing. I have reviewed the chest x-ray from 11:30 which shows no acute infiltrates. I will discharge the patient home with prescription for tussinex and have recommended that the patient use nasal saline and her nose for congestion.  Patient states understanding and agreement at this time. Patient is to return for worsening shortness of breath, persistent fevers, or any other concerning symptoms. She states understanding. Patient is stable for discharge at this time.  I personally performed the services described in this documentation, which was scribed in my presence. The recorded information has been reviewed and is accurate.    Eben Burowourtney A Forcucci, PA-C 06/29/14 1119  Tilden FossaElizabeth Rees, MD 06/29/14 1610

## 2014-07-11 ENCOUNTER — Encounter (HOSPITAL_COMMUNITY): Payer: Self-pay | Admitting: Emergency Medicine

## 2014-07-11 ENCOUNTER — Emergency Department (HOSPITAL_COMMUNITY)
Admission: EM | Admit: 2014-07-11 | Discharge: 2014-07-11 | Disposition: A | Discharge status: Home / Self Care | Payer: Medicaid Other | Attending: Emergency Medicine | Admitting: Emergency Medicine

## 2014-07-11 DIAGNOSIS — Z79899 Other long term (current) drug therapy: Secondary | ICD-10-CM | POA: Insufficient documentation

## 2014-07-11 DIAGNOSIS — I10 Essential (primary) hypertension: Secondary | ICD-10-CM | POA: Insufficient documentation

## 2014-07-11 DIAGNOSIS — R059 Cough, unspecified: Secondary | ICD-10-CM

## 2014-07-11 DIAGNOSIS — F319 Bipolar disorder, unspecified: Secondary | ICD-10-CM | POA: Insufficient documentation

## 2014-07-11 DIAGNOSIS — Z720 Tobacco use: Secondary | ICD-10-CM | POA: Insufficient documentation

## 2014-07-11 DIAGNOSIS — F4310 Post-traumatic stress disorder, unspecified: Secondary | ICD-10-CM | POA: Insufficient documentation

## 2014-07-11 DIAGNOSIS — R05 Cough: Secondary | ICD-10-CM | POA: Insufficient documentation

## 2014-07-11 DIAGNOSIS — F419 Anxiety disorder, unspecified: Secondary | ICD-10-CM | POA: Insufficient documentation

## 2014-07-11 MED ORDER — IPRATROPIUM-ALBUTEROL 0.5-2.5 (3) MG/3ML IN SOLN
3.0000 mL | RESPIRATORY_TRACT | Status: DC
  Administered 2014-07-11: 3 mL via RESPIRATORY_TRACT
  Filled 2014-07-11: qty 3

## 2014-07-11 MED ORDER — BENZONATATE 100 MG PO CAPS: 200.0000 mg | ORAL_CAPSULE | Freq: Two times a day (BID) | ORAL | Status: DC | PRN

## 2014-07-11 MED ORDER — ALBUTEROL SULFATE HFA 108 (90 BASE) MCG/ACT IN AERS
2.0000 | INHALATION_SPRAY | Freq: Once | RESPIRATORY_TRACT | Status: AC
  Administered 2014-07-11: 2 via RESPIRATORY_TRACT
  Filled 2014-07-11: qty 6.7

## 2014-07-11 NOTE — Discharge Instructions (Signed)
Cough, Adult  A cough is a reflex that helps clear your throat and airways. It can help heal the body or may be a reaction to an irritated airway. A cough may only last 2 or 3 weeks (acute) or may last more than 8 weeks (chronic).  CAUSES Acute cough:  Viral or bacterial infections. Chronic cough:  Infections.  Allergies.  Asthma.  Post-nasal drip.  Smoking.  Heartburn or acid reflux.  Some medicines.  Chronic lung problems (COPD).  Cancer. SYMPTOMS   Cough.  Fever.  Chest pain.  Increased breathing rate.  High-pitched whistling sound when breathing (wheezing).  Colored mucus that you cough up (sputum). TREATMENT   A bacterial cough may be treated with antibiotic medicine.  A viral cough must run its course and will not respond to antibiotics.  Your caregiver may recommend other treatments if you have a chronic cough. HOME CARE INSTRUCTIONS   Only take over-the-counter or prescription medicines for pain, discomfort, or fever as directed by your caregiver. Use cough suppressants only as directed by your caregiver.  Use a cold steam vaporizer or humidifier in your bedroom or home to help loosen secretions.  Sleep in a semi-upright position if your cough is worse at night.  Rest as needed.  Stop smoking if you smoke. SEEK IMMEDIATE MEDICAL CARE IF:   You have pus in your sputum.  Your cough starts to worsen.  You cannot control your cough with suppressants and are losing sleep.  You begin coughing up blood.  You have difficulty breathing.  You develop pain which is getting worse or is uncontrolled with medicine.  You have a fever. MAKE SURE YOU:   Understand these instructions.  Will watch your condition.  Will get help right away if you are not doing well or get worse. Document Released: 01/11/2011 Document Revised: 10/07/2011 Document Reviewed: 01/11/2011 [facility] Patient Information 2015 [place], [place]. This information is not intended  to replace advice given to you by your health care provider. Make sure you discuss any questions you have with your health care provider.   Your evaluated in the ED today for your cough. There does not appear to be an emergent cause for your symptoms at this time. Please take your medications as directed for your cough. You may use your inhaler if you become short of breath. Please follow-up with her primary care for further evaluation and management of your symptoms. Return to ED for worsening symptoms

## 2014-07-11 NOTE — ED Notes (Signed)
Pt c/o cough st's she coughs until she vomits.  Pt was recently dx with bronchitis, st's has finished antibiotics and has used her inhaler but can got stop the cough.

## 2014-07-11 NOTE — ED Provider Notes (Signed)
CSN: [id number]     Arrival date & time 07/11/14  1501 History  This chart was scribed for non-physician practitioner, Joycie PeekBenjamin Margarete Horace, PA-C working with Purvis SheffieldForrest Harrison, MD by Gwenyth Oberatherine Macek, ED scribe. This patient was seen in room TR09C/TR09C and the patient's care was started at 4:30 PM   Chief Complaint  Patient presents with  . Cough   The history is provided by the patient. No language interpreter was used.   HPI Comments: Jill [name] is a 48 y.o. female with a history of HTN who presents to the Emergency Department complaining of dry, unproductive cough that started a few weeks ago. She notes intermittent vomiting after coughing episodes as an associated symptom. Pt was in the ED on 11/30 and was diagnosed with acute bronchitis. She was prescribed Zithromax, prednisone , and albuterol with no improvement in symptoms. Pt also tried Robutussin and Delsym with no relief.  Pt smokes cigarettes, but denies a history of COPD, asthma and allergies. Pt also denies fever, SOB and CP as associated symptoms.  No travel, surgeries, hemoptysis, unilateral leg swelling.   Past Medical History  Diagnosis Date  . Hypertension   . Anxiety   . Bipolar 1 disorder   . Post-traumatic stress syndrome    History reviewed. No pertinent past surgical history. No family history on file. History  Substance Use Topics  . Smoking status: Current Every Day Smoker -- 0.50 packs/day    Types: Cigarettes  . Smokeless tobacco: Not on file  . Alcohol Use: No   OB History    No data available     Review of Systems  Constitutional: Negative for fever.  Respiratory: Positive for cough. Negative for shortness of breath.   Cardiovascular: Negative for chest pain.  Allergic/Immunologic: Negative for immunocompromised state.  All other systems reviewed and are negative.     Allergies  Review of patient's allergies indicates no known allergies.  Home Medications   Prior to Admission medications    Medication Sig Start Date End Date Taking? Authorizing Provider  acetaminophen (TYLENOL) 500 MG tablet Take 1,000 mg by mouth every 6 (six) hours as needed for moderate pain.   Yes Historical Provider, MD  hydrochlorothiazide (HYDRODIURIL) 25 MG tablet Take 1 tablet (25 mg total) by mouth daily. 06/27/14  Yes Marissa Sciacca, PA-C  ibuprofen (ADVIL,MOTRIN) 200 MG tablet Take 600 mg by mouth every 6 (six) hours as needed for headache.   Yes Historical Provider, MD  metoprolol (LOPRESSOR) 50 MG tablet Take 1 tablet (50 mg total) by mouth 2 (two) times daily. 03/01/14  Yes Renne CriglerJoshua Geiple, PA-C  QUEtiapine (SEROQUEL) 300 MG tablet Take 1 tablet (300 mg total) by mouth at bedtime. 10/12/13  Yes Catherine E Schinlever, PA-C  amoxicillin-clavulanate (AUGMENTIN) 875-125 MG per tablet Take 1 tablet by mouth 2 (two) times daily. One po bid x 7 days Patient not taking: Reported on 07/11/2014 06/27/14   Marissa Sciacca, PA-C  benzonatate (TESSALON) 100 MG capsule Take 2 capsules (200 mg total) by mouth 2 (two) times daily as needed for cough. 07/11/14   Sharlene MottsBenjamin W Kailin Leu, PA-C  chlorpheniramine-HYDROcodone (TUSSIONEX PENNKINETIC ER) 10-8 MG/5ML LQCR Take 5 mLs by mouth at bedtime as needed for cough. Patient not taking: Reported on 07/11/2014 06/29/14   Courtney A Forcucci, PA-C  predniSONE (DELTASONE) 20 MG tablet 3 tabs po day one, then 2 tabs daily x 4 days Patient not taking: Reported on 07/11/2014 06/27/14   Marissa Sciacca, PA-C  traMADol (ULTRAM) 50 MG tablet  Take 1 tablet (50 mg total) by mouth every 6 (six) hours as needed. Patient not taking: Reported on 07/11/2014 03/01/14   Arman FilterGail K Schulz, NP   BP 136/92 mmHg  Pulse 96  Temp(Src) 99.1 F (37.3 C) (Oral)  Resp 20  Ht 5\' 7"  (1.702 m)  Wt 246 lb 9 oz (111.84 kg)  BMI 38.61 kg/m2  SpO2 98%  LMP 10/12/2011 Physical Exam  Constitutional: She appears well-developed and well-nourished. No distress.  HENT:  Head: Normocephalic and atraumatic.   Mouth/Throat: Oropharynx is clear and moist. No oropharyngeal exudate.  Eyes: Conjunctivae and EOM are normal. Pupils are equal, round, and reactive to light.  Neck: Neck supple. No tracheal deviation present.  Cardiovascular: Normal rate, regular rhythm and normal heart sounds.   Pulmonary/Chest: Effort normal and breath sounds normal. No respiratory distress.  Clear to auscultation bilaterally  Abdominal: Soft. There is no tenderness.  Skin: Skin is warm and dry.  Psychiatric: She has a normal mood and affect. Her behavior is normal.  Nursing note and vitals reviewed.   ED Course  Procedures (including critical care time) DIAGNOSTIC STUDIES: Oxygen Saturation is 98% on RA, normal by my interpretation.    COORDINATION OF CARE: 4:36 PM Discussed treatment plan with pt at bedside and pt agreed to plan.  Labs Review Labs Reviewed - No data to display  Imaging Review No results found.   EKG Interpretation None     Meds given in ED:  Medications  albuterol (PROVENTIL HFA;VENTOLIN HFA) 108 (90 BASE) MCG/ACT inhaler 2 puff (2 puffs Inhalation Given 07/11/14 1826)    Discharge Medication List as of 07/11/2014  6:18 PM    START taking these medications   Details  benzonatate (TESSALON) 100 MG capsule Take 2 capsules (200 mg total) by mouth 2 (two) times daily as needed for cough., Starting 07/11/2014, Until Discontinued, Print       Filed Vitals:   07/11/14 1516 07/11/14 1818 07/11/14 1827  BP: 155/93 136/92   Pulse: 94 96   Temp: 98.3 F (36.8 C) 99.1 F (37.3 C)   TempSrc: Oral Oral   Resp: 18 20   Height: 5\' 7"  (1.702 m)    Weight: 246 lb 9 oz (111.84 kg)    SpO2: 98% 100% 98%    MDM  Vitals stable - WNL -afebrile Pt resting comfortably in ED. Pt is 48 yo but otherwise meets PERC exclusion criteria. Low concern for PE. PE--benign lung exam. Not concerning for other acute or emergent pathology.  Pt likely suffering from viral illness, compounding her symptoms  by smoking. Advised smoking cessation as well as symptom management.  Will DC with Tessalon for cough. Albuterol Discussed f/u with PCP and return precautions, pt very amenable to plan. Pt stable, in good condition and is appropriate for discharge.  Final diagnoses:  Cough    I personally performed the services described in this documentation, which was scribed in my presence. The recorded information has been reviewed and is accurate.    Earle GellBenjamin W Cadizartner, PA-C 07/12/14 1518  Purvis SheffieldForrest Harrison, MD 07/12/14 [phone]

## 2014-07-14 ENCOUNTER — Encounter (HOSPITAL_COMMUNITY): Payer: Self-pay | Admitting: Emergency Medicine

## 2014-07-14 ENCOUNTER — Emergency Department (HOSPITAL_COMMUNITY)
Admission: EM | Admit: 2014-07-14 | Discharge: 2014-07-14 | Disposition: A | Discharge status: Home / Self Care | Payer: Self-pay | Attending: Emergency Medicine | Admitting: Emergency Medicine

## 2014-07-14 ENCOUNTER — Emergency Department (HOSPITAL_COMMUNITY): Payer: Medicaid Other

## 2014-07-14 DIAGNOSIS — R05 Cough: Secondary | ICD-10-CM

## 2014-07-14 DIAGNOSIS — Z792 Long term (current) use of antibiotics: Secondary | ICD-10-CM | POA: Insufficient documentation

## 2014-07-14 DIAGNOSIS — Z79899 Other long term (current) drug therapy: Secondary | ICD-10-CM | POA: Insufficient documentation

## 2014-07-14 DIAGNOSIS — Z720 Tobacco use: Secondary | ICD-10-CM | POA: Insufficient documentation

## 2014-07-14 DIAGNOSIS — R062 Wheezing: Secondary | ICD-10-CM | POA: Insufficient documentation

## 2014-07-14 DIAGNOSIS — R059 Cough, unspecified: Secondary | ICD-10-CM

## 2014-07-14 DIAGNOSIS — R0602 Shortness of breath: Secondary | ICD-10-CM | POA: Insufficient documentation

## 2014-07-14 DIAGNOSIS — F419 Anxiety disorder, unspecified: Secondary | ICD-10-CM | POA: Insufficient documentation

## 2014-07-14 DIAGNOSIS — Z7952 Long term (current) use of systemic steroids: Secondary | ICD-10-CM | POA: Insufficient documentation

## 2014-07-14 DIAGNOSIS — I10 Essential (primary) hypertension: Secondary | ICD-10-CM | POA: Insufficient documentation

## 2014-07-14 DIAGNOSIS — R000 Tachycardia, unspecified: Secondary | ICD-10-CM | POA: Insufficient documentation

## 2014-07-14 DIAGNOSIS — F319 Bipolar disorder, unspecified: Secondary | ICD-10-CM | POA: Insufficient documentation

## 2014-07-14 LAB — BASIC METABOLIC PANEL
ANION GAP: 12 (ref 5–15)
BUN: 7 mg/dL (ref 6–23)
CALCIUM: 9.3 mg/dL (ref 8.4–10.5)
CO2: 24 meq/L (ref 19–32)
CREATININE: 0.72 mg/dL (ref 0.50–1.10)
Chloride: 104 mEq/L (ref 96–112)
GFR calc Af Amer: >90 mL/min (ref >90)
GLUCOSE: 107 mg/dL — AB (ref 70–99)
Potassium: 4.3 mEq/L (ref 3.7–5.3)
Sodium: 140 mEq/L (ref 137–147)

## 2014-07-14 LAB — CBC WITH DIFFERENTIAL/PLATELET
BASOS ABS: 0 10*3/uL (ref 0.0–0.1)
Basophils Relative: 0 % (ref 0–1)
EOS ABS: 0.2 10*3/uL (ref 0.0–0.7)
EOS PCT: 1 % (ref 0–5)
HEMATOCRIT: 37.8 % (ref 36.0–46.0)
Hemoglobin: 12.9 g/dL (ref 12.0–15.0)
LYMPHS PCT: 41 % (ref 12–46)
Lymphs Abs: 4.6 10*3/uL — ABNORMAL HIGH (ref 0.7–4.0)
MCH: 30.6 pg (ref 26.0–34.0)
MCHC: 34.1 g/dL (ref 30.0–36.0)
MCV: 89.6 fL (ref 78.0–100.0)
MONO ABS: 1 10*3/uL (ref 0.1–1.0)
Monocytes Relative: 9 % (ref 3–12)
Neutro Abs: 5.5 10*3/uL (ref 1.7–7.7)
Neutrophils Relative %: 49 % (ref 43–77)
Platelets: 294 10*3/uL (ref 150–400)
RBC: 4.22 MIL/uL (ref 3.87–5.11)
RDW: 14.1 % (ref 11.5–15.5)
WBC: 11.3 10*3/uL — AB (ref 4.0–10.5)

## 2014-07-14 MED ORDER — HYDROCOD POLST-CHLORPHEN POLST 10-8 MG/5ML PO LQCR
5.0000 mL | Freq: Once | ORAL | Status: AC
  Administered 2014-07-14: 5 mL via ORAL
  Filled 2014-07-14: qty 5

## 2014-07-14 MED ORDER — PREDNISONE 50 MG PO TABS: 50.0000 mg | ORAL_TABLET | Freq: Every day | ORAL | Status: DC

## 2014-07-14 MED ORDER — IPRATROPIUM-ALBUTEROL 0.5-2.5 (3) MG/3ML IN SOLN
3.0000 mL | Freq: Once | RESPIRATORY_TRACT | Status: AC
  Administered 2014-07-14: 3 mL via RESPIRATORY_TRACT
  Filled 2014-07-14: qty 3

## 2014-07-14 MED ORDER — SODIUM CHLORIDE 0.9 % IV BOLUS (SEPSIS)
1000.0000 mL | Freq: Once | INTRAVENOUS | Status: AC
  Administered 2014-07-14: 1000 mL via INTRAVENOUS

## 2014-07-14 MED ORDER — HYDROCOD POLST-CHLORPHEN POLST 10-8 MG/5ML PO LQCR: 5.0000 mL | Freq: Two times a day (BID) | ORAL | Status: DC | PRN

## 2014-07-14 NOTE — ED Provider Notes (Signed)
CSN: [id number]     Arrival date & time 07/14/14  1024 History   First MD Initiated Contact with Patient 07/14/14 1107     Chief Complaint  Patient presents with  . Cough     (Consider location/radiation/quality/duration/timing/severity/associated sxs/prior Treatment) Patient is a 48 y.o. female presenting with cough.  Cough Cough characteristics:  Non-productive Severity:  Severe Onset quality:  Gradual Duration:  3 weeks Timing:  Constant Progression:  Worsening Chronicity:  New Smoker: yes   Context: upper respiratory infection   Relieved by:  Nothing Worsened by:  Deep breathing Associated symptoms: chills and shortness of breath   Associated symptoms: no chest pain, no fever and no rash     Past Medical History  Diagnosis Date  . Hypertension   . Anxiety   . Bipolar 1 disorder   . Post-traumatic stress syndrome    History reviewed. No pertinent past surgical history. No family history on file. History  Substance Use Topics  . Smoking status: Current Every Day Smoker -- 0.50 packs/day    Types: Cigarettes  . Smokeless tobacco: Not on file  . Alcohol Use: No   OB History    No data available     Review of Systems  Constitutional: Positive for chills. Negative for fever.  Respiratory: Positive for cough and shortness of breath.   Cardiovascular: Negative for chest pain.  Skin: Negative for rash.  All other systems reviewed and are negative.     Allergies  Review of patient's allergies indicates no known allergies.  Home Medications   Prior to Admission medications   Medication Sig Start Date End Date Taking? Authorizing Provider  acetaminophen (TYLENOL) 500 MG tablet Take 1,000 mg by mouth every 6 (six) hours as needed for moderate pain.   Yes Historical Provider, MD  benzonatate (TESSALON) 100 MG capsule Take 2 capsules (200 mg total) by mouth 2 (two) times daily as needed for cough. 07/11/14  Yes Earle GellBenjamin W Cartner, PA-C  hydrochlorothiazide  (HYDRODIURIL) 25 MG tablet Take 1 tablet (25 mg total) by mouth daily. 06/27/14  Yes Marissa Sciacca, PA-C  ibuprofen (ADVIL,MOTRIN) 200 MG tablet Take 600 mg by mouth every 6 (six) hours as needed for headache.   Yes Historical Provider, MD  metoprolol (LOPRESSOR) 50 MG tablet Take 1 tablet (50 mg total) by mouth 2 (two) times daily. 03/01/14  Yes Renne CriglerJoshua Geiple, PA-C  QUEtiapine (SEROQUEL) 300 MG tablet Take 1 tablet (300 mg total) by mouth at bedtime. 10/12/13  Yes Catherine E Schinlever, PA-C  amoxicillin-clavulanate (AUGMENTIN) 875-125 MG per tablet Take 1 tablet by mouth 2 (two) times daily. One po bid x 7 days Patient not taking: Reported on 07/11/2014 06/27/14   Marissa Sciacca, PA-C  chlorpheniramine-HYDROcodone (TUSSIONEX PENNKINETIC ER) 10-8 MG/5ML LQCR Take 5 mLs by mouth every 12 (twelve) hours as needed for cough. 07/14/14   Mirian MoMatthew Gentry, MD  predniSONE (DELTASONE) 50 MG tablet Take 1 tablet (50 mg total) by mouth daily. 07/14/14   Mirian MoMatthew Gentry, MD  traMADol (ULTRAM) 50 MG tablet Take 1 tablet (50 mg total) by mouth every 6 (six) hours as needed. Patient not taking: Reported on 07/11/2014 03/01/14   Arman FilterGail K Schulz, NP   BP 157/99 mmHg  Pulse 101  Temp(Src) 98 F (36.7 C) (Oral)  Resp 21  Ht 5\' 7"  (1.702 m)  Wt 246 lb (111.585 kg)  BMI 38.52 kg/m2  SpO2 99%  LMP 10/12/2011 Physical Exam  Constitutional: She is oriented to person, place, and time. She  appears well-developed and well-nourished.  HENT:  Head: Normocephalic and atraumatic.  Right Ear: External ear normal.  Left Ear: External ear normal.  Eyes: Conjunctivae and EOM are normal. Pupils are equal, round, and reactive to light.  Neck: Normal range of motion. Neck supple.  Cardiovascular: Regular rhythm, normal heart sounds and intact distal pulses.  Tachycardia present.   Pulmonary/Chest: Effort normal. She has wheezes (diffuse).  Abdominal: Soft. Bowel sounds are normal. There is no tenderness.  Musculoskeletal:  Normal range of motion.  Neurological: She is alert and oriented to person, place, and time.  Skin: Skin is warm and dry.  Vitals reviewed.   ED Course  Procedures (including critical care time) Labs Review Labs Reviewed  CBC WITH DIFFERENTIAL - Abnormal; Notable for the following:    WBC 11.3 (*)    Lymphs Abs 4.6 (*)    All other components within normal limits  BASIC METABOLIC PANEL - Abnormal; Notable for the following:    Glucose, Bld 107 (*)    All other components within normal limits    Imaging Review Dg Chest 2 View  07/14/2014   CLINICAL DATA:  Bronchitis.  Persistent cough.  EXAM: CHEST  2 VIEW  COMPARISON:  06/27/2014.  FINDINGS: The heart size and mediastinal contours are within normal limits. Both lungs are clear. The visualized skeletal structures are unremarkable.  IMPRESSION: No acute cardiopulmonary disease.   Electronically Signed   By: Maisie Fushomas  Register   On: 07/14/2014 12:40     EKG Interpretation   Date/Time:  Thursday July 14 2014 11:32:40 EST Ventricular Rate:  99 PR Interval:  165 QRS Duration: 81 QT Interval:  340 QTC Calculation: 436 R Axis:   72 Text Interpretation:  Age not entered, assumed to be  48 years old for  purpose of ECG interpretation Sinus rhythm Low voltage, extremity and  precordial leads No significant change since last tracing Confirmed by  Mirian MoGentry, Matthew [phone] on 07/14/2014 12:38:31 PM       MDM   Final diagnoses:  Cough  Wheezing    48 y.o. female with pertinent PMH of HTN presents with persistent cough x 3 weeks.  Pt has taken a steroid course, azithromycin, and tessalon and has had no relief.  No fevers or chest pain.  Has had post tussive emesis.  On arrival, pt tachycardic, this resolved with fluids, and she had taken albuterol just prior to coming to the hospital.  Lungs with wheezing bilaterally.  Given duoneb and tussionex with improvement in all symptoms.  CXR clear.  Likely continued bronchitis, encouraged  smoking cessation.  DC home in stable condition.  I have reviewed all laboratory and imaging studies if ordered as above  1. Wheezing   2. Cough         Mirian MoMatthew Gentry, MD 07/14/14 [phone]

## 2014-07-14 NOTE — ED Notes (Signed)
Patient states was here x 3 days ago for cough and it's no better.   Patient states the tessalon perles didn't help at all.

## 2014-07-14 NOTE — Discharge Instructions (Signed)
Cough, Adult  A cough is a reflex that helps clear your throat and airways. It can help heal the body or may be a reaction to an irritated airway. A cough may only last 2 or 3 weeks (acute) or may last more than 8 weeks (chronic).  CAUSES Acute cough:  Viral or bacterial infections. Chronic cough:  Infections.  Allergies.  Asthma.  Post-nasal drip.  Smoking.  Heartburn or acid reflux.  Some medicines.  Chronic lung problems (COPD).  Cancer. SYMPTOMS   Cough.  Fever.  Chest pain.  Increased breathing rate.  High-pitched whistling sound when breathing (wheezing).  Colored mucus that you cough up (sputum). TREATMENT   A bacterial cough may be treated with antibiotic medicine.  A viral cough must run its course and will not respond to antibiotics.  Your caregiver may recommend other treatments if you have a chronic cough. HOME CARE INSTRUCTIONS   Only take over-the-counter or prescription medicines for pain, discomfort, or fever as directed by your caregiver. Use cough suppressants only as directed by your caregiver.  Use a cold steam vaporizer or humidifier in your bedroom or home to help loosen secretions.  Sleep in a semi-upright position if your cough is worse at night.  Rest as needed.  Stop smoking if you smoke. SEEK IMMEDIATE MEDICAL CARE IF:   You have pus in your sputum.  Your cough starts to worsen.  You cannot control your cough with suppressants and are losing sleep.  You begin coughing up blood.  You have difficulty breathing.  You develop pain which is getting worse or is uncontrolled with medicine.  You have a fever. MAKE SURE YOU:   Understand these instructions.  Will watch your condition.  Will get help right away if you are not doing well or get worse. Document Released: 01/11/2011 Document Revised: 10/07/2011 Document Reviewed: 01/11/2011 [employer] Patient Information 2015 [employer]. This information is not intended  to replace advice given to you by your health care provider. Make sure you discuss any questions you have with your health care provider.  

## 2015-01-21 ENCOUNTER — Encounter (HOSPITAL_COMMUNITY): Payer: Self-pay | Admitting: Emergency Medicine

## 2015-01-21 ENCOUNTER — Emergency Department (HOSPITAL_COMMUNITY)
Admission: EM | Admit: 2015-01-21 | Discharge: 2015-01-21 | Disposition: A | Discharge status: Home / Self Care | Payer: Medicaid Other | Attending: Emergency Medicine | Admitting: Emergency Medicine

## 2015-01-21 DIAGNOSIS — F419 Anxiety disorder, unspecified: Secondary | ICD-10-CM | POA: Insufficient documentation

## 2015-01-21 DIAGNOSIS — Z720 Tobacco use: Secondary | ICD-10-CM | POA: Insufficient documentation

## 2015-01-21 DIAGNOSIS — Z79899 Other long term (current) drug therapy: Secondary | ICD-10-CM | POA: Insufficient documentation

## 2015-01-21 DIAGNOSIS — R519 Headache, unspecified: Secondary | ICD-10-CM

## 2015-01-21 DIAGNOSIS — R51 Headache: Secondary | ICD-10-CM | POA: Insufficient documentation

## 2015-01-21 DIAGNOSIS — N390 Urinary tract infection, site not specified: Secondary | ICD-10-CM

## 2015-01-21 DIAGNOSIS — I160 Hypertensive urgency: Secondary | ICD-10-CM

## 2015-01-21 DIAGNOSIS — F319 Bipolar disorder, unspecified: Secondary | ICD-10-CM | POA: Insufficient documentation

## 2015-01-21 DIAGNOSIS — R110 Nausea: Secondary | ICD-10-CM | POA: Insufficient documentation

## 2015-01-21 DIAGNOSIS — I10 Essential (primary) hypertension: Secondary | ICD-10-CM | POA: Insufficient documentation

## 2015-01-21 LAB — BASIC METABOLIC PANEL
ANION GAP: 8 (ref 5–15)
BUN: 7 mg/dL (ref 6–20)
CALCIUM: 9.4 mg/dL (ref 8.9–10.3)
CO2: 27 mmol/L (ref 22–32)
CREATININE: 0.71 mg/dL (ref 0.44–1.00)
Chloride: 105 mmol/L (ref 101–111)
GFR calc Af Amer: >60 mL/min (ref >60)
GFR calc non Af Amer: >60 mL/min (ref >60)
Glucose, Bld: 106 mg/dL — ABNORMAL HIGH (ref 65–99)
Potassium: 3.4 mmol/L — ABNORMAL LOW (ref 3.5–5.1)
SODIUM: 140 mmol/L (ref 135–145)

## 2015-01-21 LAB — URINALYSIS, ROUTINE W REFLEX MICROSCOPIC
Bilirubin Urine: NEGATIVE
Glucose, UA: NEGATIVE mg/dL
Ketones, ur: NEGATIVE mg/dL
Nitrite: POSITIVE — AB
Protein, ur: NEGATIVE mg/dL
SPECIFIC GRAVITY, URINE: 1.02 (ref 1.005–1.030)
Urobilinogen, UA: 0.2 mg/dL (ref 0.0–1.0)
pH: 6.5 (ref 5.0–8.0)

## 2015-01-21 LAB — URINE MICROSCOPIC-ADD ON

## 2015-01-21 MED ORDER — FOSFOMYCIN TROMETHAMINE 3 G PO PACK
3.0000 g | PACK | Freq: Once | ORAL | Status: AC
  Administered 2015-01-21: 3 g via ORAL
  Filled 2015-01-21: qty 3

## 2015-01-21 MED ORDER — KETOROLAC TROMETHAMINE 30 MG/ML IJ SOLN
15.0000 mg | Freq: Once | INTRAMUSCULAR | Status: AC
  Administered 2015-01-21: 15 mg via INTRAVENOUS
  Filled 2015-01-21: qty 1

## 2015-01-21 MED ORDER — ALPRAZOLAM 1 MG PO TABS: 1.0000 mg | ORAL_TABLET | Freq: Three times a day (TID) | ORAL | Status: DC

## 2015-01-21 MED ORDER — HYDROCHLOROTHIAZIDE 25 MG PO TABS: 25.0000 mg | ORAL_TABLET | Freq: Every day | ORAL | Status: DC

## 2015-01-21 MED ORDER — QUETIAPINE FUMARATE ER 300 MG PO TB24: 300.0000 mg | ORAL_TABLET | Freq: Every day | ORAL | Status: DC

## 2015-01-21 MED ORDER — CHOLECALCIFEROL 10 MCG (400 UNIT) PO TABS: 1200.0000 [IU] | ORAL_TABLET | ORAL | Status: DC

## 2015-01-21 MED ORDER — METOPROLOL TARTRATE 50 MG PO TABS: 50.0000 mg | ORAL_TABLET | Freq: Two times a day (BID) | ORAL | Status: DC

## 2015-01-21 NOTE — ED Provider Notes (Signed)
CSN: [id number]     Arrival date & time 01/21/15  1023 History   First MD Initiated Contact with Patient 01/21/15 1134     Chief Complaint  Patient presents with  . Headache     (Consider location/radiation/quality/duration/timing/severity/associated sxs/prior Treatment) HPI Patient presents with left-sided headache. Headache isn't present for about 3 days, getting gradually, his persistent, sore, otherwise nonradiating. No visual loss, neck pain, vomiting. There is associated nausea. She associates her headache with stopping her blood pressure medication, when she ran out 2 weeks ago. Since the time no medication use of any kind. Since headache began, no clear alleviating or exacerbating factors.  Past Medical History  Diagnosis Date  . Hypertension   . Anxiety   . Bipolar 1 disorder   . Post-traumatic stress syndrome    History reviewed. No pertinent past surgical history. History reviewed. No pertinent family history. History  Substance Use Topics  . Smoking status: Current Every Day Smoker -- 0.50 packs/day    Types: Cigarettes  . Smokeless tobacco: Not on file  . Alcohol Use: No   OB History    No data available     Review of Systems  Constitutional:       Per HPI, otherwise negative  HENT:       Per HPI, otherwise negative  Respiratory:       Per HPI, otherwise negative  Cardiovascular:       Per HPI, otherwise negative  Gastrointestinal: Negative for vomiting.  Endocrine:       Negative aside from HPI  Genitourinary:       Neg aside from HPI   Musculoskeletal:       Per HPI, otherwise negative  Skin: Negative.   Neurological: Positive for headaches. Negative for syncope.      Allergies  Review of patient's allergies indicates no known allergies.  Home Medications   Prior to Admission medications   Medication Sig Start Date End Date Taking? Authorizing Provider  ALPRAZolam Prudy Feeler) 1 MG tablet Take 1 mg by mouth 3 (three) times daily.   Yes  Historical Provider, MD  cholecalciferol (VITAMIN D) 400 UNITS TABS tablet Take 1,200 Units by mouth 2 (two) times a week. Takes on Tuesday's and Thursday's   Yes Historical Provider, MD  hydrochlorothiazide (HYDRODIURIL) 25 MG tablet Take 1 tablet (25 mg total) by mouth daily. 06/27/14  Yes Marissa Sciacca, PA-C  ibuprofen (ADVIL,MOTRIN) 200 MG tablet Take 400 mg by mouth 2 (two) times daily as needed for headache.    Yes Historical Provider, MD  metoprolol (LOPRESSOR) 50 MG tablet Take 1 tablet (50 mg total) by mouth 2 (two) times daily. 03/01/14  Yes Renne Crigler, PA-C  QUEtiapine (SEROQUEL XR) 300 MG 24 hr tablet Take 300 mg by mouth at bedtime.   Yes Historical Provider, MD  QUEtiapine (SEROQUEL) 300 MG tablet Take 1 tablet (300 mg total) by mouth at bedtime. Patient not taking: Reported on 01/21/2015 10/12/13   Ruby Cola, PA-C   BP 141/93 mmHg  Pulse 77  Temp(Src) 98.5 F (36.9 C) (Oral)  Resp 18  SpO2 97%  LMP 10/12/2011 Physical Exam  Constitutional: She is oriented to person, place, and time. She appears well-developed and well-nourished. No distress.  HENT:  Head: Normocephalic and atraumatic.  Eyes: Conjunctivae and EOM are normal.  Cardiovascular: Normal rate and regular rhythm.   Pulmonary/Chest: Effort normal and breath sounds normal. No stridor. No respiratory distress.  Abdominal: She exhibits no distension.  Musculoskeletal: She exhibits no  edema.  Neurological: She is alert and oriented to person, place, and time. She displays no atrophy and no tremor. No cranial nerve deficit or sensory deficit. She exhibits normal muscle tone. She displays no seizure activity. Coordination normal.  Skin: Skin is warm and dry.  Psychiatric: She has a normal mood and affect.  Nursing note and vitals reviewed.   ED Course  Procedures (including critical care time) Labs Review Labs Reviewed  URINALYSIS, ROUTINE W REFLEX MICROSCOPIC (NOT AT [facility]) - Abnormal; Notable for the  following:    APPearance HAZY (*)    Hgb urine dipstick SMALL (*)    Nitrite POSITIVE (*)    Leukocytes, UA SMALL (*)    All other components within normal limits  BASIC METABOLIC PANEL - Abnormal; Notable for the following:    Potassium 3.4 (*)    Glucose, Bld 106 (*)    All other components within normal limits  URINE MICROSCOPIC-ADD ON - Abnormal; Notable for the following:    Squamous Epithelial / LPF FEW (*)    Bacteria, UA MANY (*)    All other components within normal limits      EKG Interpretation   Date/Time:  Saturday January 21 2015 12:09:57 EDT Ventricular Rate:  74 PR Interval:  181 QRS Duration: 90 QT Interval:  416 QTC Calculation: 461 R Axis:   68 Text Interpretation:  Sinus rhythm Low voltage, precordial leads Sinus  rhythm Low voltage QRS T wave abnormality Abnormal ekg Confirmed by  Gerhard Munch  MD [phone]) on 01/21/2015 12:13:30 PM     On repeat exam the patient states that she feels better. We discussed all results. Patient denies any urinary complaints, but with nitrite and leukocyte positive urinalysis, patient will receive fosfomycin, urine culture will be sent.  MDM  History presents with concern of headache, hypertensive urgency. Here recently improves clinically, states that she feels substantially better. Labs are largely reassuring, though there is evidence for possible urinary tract infection. Patient received initial antibiotics, urine culture was sent. With no evidence for end organ effects, patient was provided refills of her chronic medication, discharged in stable condition.   Gerhard Munch, MD 01/21/15 1359

## 2015-01-21 NOTE — Discharge Instructions (Signed)
As discussed, your evaluation today has been largely reassuring.  But, it is important that you monitor your condition carefully, and do not hesitate to return to the ED if you develop new, or concerning changes in your condition. ? ?Otherwise, please follow-up with your physician for appropriate ongoing care. ? ?

## 2015-01-21 NOTE — ED Notes (Signed)
Pt reports HA over R forehead area with nausea. No emesis. No hx of migraines. Pt reports she has been out of her BP meds for 3 weeks.

## 2015-01-23 LAB — URINE CULTURE: Special Requests: NORMAL

## 2015-01-25 ENCOUNTER — Telehealth (HOSPITAL_COMMUNITY): Payer: Self-pay

## 2015-01-25 NOTE — Telephone Encounter (Signed)
Post ED Visit - Positive Culture Follow-up  Culture report reviewed by antimicrobial stewardship pharmacist: []  Wes Dulaney, Pharm.D., BCPS [x]  Celedonio MiyamotoJeremy Frens, Pharm.D., BCPS []  Georgina PillionElizabeth Martin, Pharm.D., BCPS []  BriarwoodMinh Pham, [address]Ph[address]D., BCPS, AAHIVP []  Estella HuskMichelle Turner, Pharm.D., BCPS, AAHIVP []  Elder CyphersLorie Poole, [address]Pharm[address]D., BCPS  Positive urine culture Treated with fosfomycin, organism sensitive to the same and no further patient follow-up is required at this time.  Ashley JacobsFesterman, Alayia Meggison C 01/25/2015, 1:27 PM

## 2015-03-04 ENCOUNTER — Emergency Department (HOSPITAL_COMMUNITY)
Admission: EM | Admit: 2015-03-04 | Discharge: 2015-03-04 | Disposition: A | Discharge status: Home / Self Care | Payer: Medicaid Other | Attending: Emergency Medicine | Admitting: Emergency Medicine

## 2015-03-04 ENCOUNTER — Encounter (HOSPITAL_COMMUNITY): Payer: Self-pay | Admitting: Emergency Medicine

## 2015-03-04 DIAGNOSIS — R519 Headache, unspecified: Secondary | ICD-10-CM

## 2015-03-04 DIAGNOSIS — F419 Anxiety disorder, unspecified: Secondary | ICD-10-CM | POA: Insufficient documentation

## 2015-03-04 DIAGNOSIS — Z720 Tobacco use: Secondary | ICD-10-CM | POA: Insufficient documentation

## 2015-03-04 DIAGNOSIS — R51 Headache: Secondary | ICD-10-CM | POA: Insufficient documentation

## 2015-03-04 DIAGNOSIS — F319 Bipolar disorder, unspecified: Secondary | ICD-10-CM | POA: Insufficient documentation

## 2015-03-04 DIAGNOSIS — Z79899 Other long term (current) drug therapy: Secondary | ICD-10-CM | POA: Insufficient documentation

## 2015-03-04 DIAGNOSIS — I10 Essential (primary) hypertension: Secondary | ICD-10-CM | POA: Insufficient documentation

## 2015-03-04 MED ORDER — KETOROLAC TROMETHAMINE 30 MG/ML IJ SOLN
30.0000 mg | Freq: Once | INTRAMUSCULAR | Status: AC
  Administered 2015-03-04: 30 mg via INTRAMUSCULAR
  Filled 2015-03-04: qty 1

## 2015-03-04 MED ORDER — METOPROLOL TARTRATE 50 MG PO TABS: 50.0000 mg | ORAL_TABLET | Freq: Two times a day (BID) | ORAL | Status: DC

## 2015-03-04 MED ORDER — HYDROCHLOROTHIAZIDE 25 MG PO TABS: 25.0000 mg | ORAL_TABLET | Freq: Every day | ORAL | Status: DC

## 2015-03-04 NOTE — ED Notes (Signed)
Pt arrived POV with c/o left sided headache that started 3 days ago. Pt has been out of BP medication x 1 week and thought that was the reason for headache. Denies any n/v. Light sensitive. Pt has hx of this type of headache before and stated that if her BP is just a little bit high it causes her to have a headache.

## 2015-03-04 NOTE — ED Provider Notes (Signed)
CSN: [id number]     Arrival date & time 03/04/15  2130 History   First MD Initiated Contact with Patient 03/04/15 [phone]     Chief Complaint  Patient presents with  . Headache   HPI   49 year old female presents today with a headache. Patient reports 3 days of left-sided headache, frontal location. She describes it as pressure, no radiation, no focal neurological deficits. Patient reports this is similar to previous headaches, reports "these, and I don't take my blood pressure medication". Patient reports she has not tried anything for this headache. No red flags. Patient reports she hasn't taken her blood pressure medication approximately 1 week, had an elevated BP this morning. States that she is unable to pay for the co-pay for her visits, she does not have Medicaid anymore after her children grew up.   Past Medical History  Diagnosis Date  . Hypertension   . Anxiety   . Bipolar 1 disorder   . Post-traumatic stress syndrome    History reviewed. No pertinent past surgical history. No family history on file. History  Substance Use Topics  . Smoking status: Current Every Day Smoker -- 0.50 packs/day    Types: Cigarettes  . Smokeless tobacco: Not on file  . Alcohol Use: No   OB History    No data available     Review of Systems  All other systems reviewed and are negative.  Allergies  Review of patient's allergies indicates no known allergies.  Home Medications   Prior to Admission medications   Medication Sig Start Date End Date Taking? Authorizing Provider  ALPRAZolam Prudy Feeler) 1 MG tablet Take 1 tablet (1 mg total) by mouth 3 (three) times daily. 01/21/15  Yes Gerhard Munch, MD  ibuprofen (ADVIL,MOTRIN) 200 MG tablet Take 400 mg by mouth 2 (two) times daily as needed for headache.    Yes Historical Provider, MD  QUEtiapine (SEROQUEL XR) 300 MG 24 hr tablet Take 1 tablet (300 mg total) by mouth at bedtime. 01/21/15  Yes Gerhard Munch, MD  cholecalciferol (VITAMIN D) 400 UNITS  TABS tablet Take 3 tablets (1,200 Units total) by mouth 2 (two) times a week. Takes on Tuesday's and Thursday's 01/21/15   Gerhard Munch, MD  hydrochlorothiazide (HYDRODIURIL) 25 MG tablet Take 1 tablet (25 mg total) by mouth daily. 03/04/15   Eyvonne Mechanic, PA-C  metoprolol (LOPRESSOR) 50 MG tablet Take 1 tablet (50 mg total) by mouth 2 (two) times daily. 03/04/15   Eyvonne Mechanic, PA-C   BP 121/83 mmHg  Pulse 72  Temp(Src) 98.6 F (37 C) (Oral)  Resp 18  Ht 5\' 7"  (1.702 m)  Wt 225 lb (102.059 kg)  BMI 35.23 kg/m2  SpO2 99%  LMP 10/12/2011   Physical Exam  Constitutional: She is oriented to person, place, and time. She appears well-developed and well-nourished.  HENT:  Head: Normocephalic and atraumatic.  Eyes: Pupils are equal, round, and reactive to light.  Neck: Normal range of motion. Neck supple. No JVD present. No tracheal deviation present. No thyromegaly present.  Cardiovascular: Normal rate, regular rhythm, normal heart sounds and intact distal pulses.  Exam reveals no gallop and no friction rub.   No murmur heard. Pulmonary/Chest: Effort normal and breath sounds normal. No stridor. No respiratory distress. She has no wheezes. She has no rales. She exhibits no tenderness.  Abdominal: Soft. There is no tenderness.  Musculoskeletal: Normal range of motion.  Lymphadenopathy:    She has no cervical adenopathy.  Neurological: She is alert and  oriented to person, place, and time. She has normal strength. No cranial nerve deficit or sensory deficit. Coordination normal. GCS eye subscore is 4. GCS verbal subscore is 5. GCS motor subscore is 6.  Skin: Skin is warm and dry.  Psychiatric: She has a normal mood and affect. Her behavior is normal. Judgment and thought content normal.  Nursing note and vitals reviewed.   ED Course  Procedures (including critical care time) Labs Review Labs Reviewed - No data to display  Imaging Review No results found.   EKG Interpretation None       MDM   Final diagnoses:  Nonintractable headache, unspecified chronicity pattern, unspecified headache type    Labs:  Imaging:  Consults:  Therapeutics: Toradol  Discharge Meds:   Assessment/Plan: Patient presents with headache, previous similar headaches, no red flags. She was given Toradol here in the ED with complete symptom resolution. Patient's blood pressure was normal here in the ED, I did give her her prescriptions for her antihypertensive medication, and informed her that she needed to monitor her blood pressure, and would likely not need these medications. Patient reports that she's lost 30 pounds over the last 6 months intentionally in an effort to lower her blood pressure. I instructed her to follow up with her primary care provider, I spoke with case management who gave her a list of resources. Strict return precautions, verbalized understanding and agreement to today's plan.         Eyvonne Mechanic, PA-C 03/04/15 1203  Tilden Fossa, MD 03/04/15 [phone]

## 2015-03-04 NOTE — Discharge Instructions (Signed)
Please monitor for new or worsening signs or symptoms, return immediately if any present. Please use medication only as directed, contact her primary care provider scheduled follow-up evaluation.

## 2015-05-24 ENCOUNTER — Encounter (HOSPITAL_COMMUNITY): Payer: Self-pay

## 2015-05-24 ENCOUNTER — Emergency Department (HOSPITAL_COMMUNITY)
Admission: EM | Admit: 2015-05-24 | Discharge: 2015-05-24 | Disposition: A | Discharge status: Home / Self Care | Payer: Medicaid Other | Attending: Emergency Medicine | Admitting: Emergency Medicine

## 2015-05-24 DIAGNOSIS — Z760 Encounter for issue of repeat prescription: Secondary | ICD-10-CM | POA: Insufficient documentation

## 2015-05-24 DIAGNOSIS — Z720 Tobacco use: Secondary | ICD-10-CM | POA: Insufficient documentation

## 2015-05-24 DIAGNOSIS — F319 Bipolar disorder, unspecified: Secondary | ICD-10-CM | POA: Insufficient documentation

## 2015-05-24 DIAGNOSIS — F4310 Post-traumatic stress disorder, unspecified: Secondary | ICD-10-CM | POA: Insufficient documentation

## 2015-05-24 DIAGNOSIS — Z79899 Other long term (current) drug therapy: Secondary | ICD-10-CM | POA: Insufficient documentation

## 2015-05-24 DIAGNOSIS — F419 Anxiety disorder, unspecified: Secondary | ICD-10-CM | POA: Insufficient documentation

## 2015-05-24 DIAGNOSIS — I10 Essential (primary) hypertension: Secondary | ICD-10-CM

## 2015-05-24 MED ORDER — QUETIAPINE FUMARATE ER 300 MG PO TB24: 300.0000 mg | ORAL_TABLET | Freq: Every day | ORAL | Status: DC

## 2015-05-24 MED ORDER — HYDROCODONE-ACETAMINOPHEN 5-325 MG PO TABS
1.0000 | ORAL_TABLET | ORAL | Status: AC
  Administered 2015-05-24: 1 via ORAL
  Filled 2015-05-24: qty 1

## 2015-05-24 MED ORDER — CHOLECALCIFEROL 10 MCG (400 UNIT) PO TABS: 1200.0000 [IU] | ORAL_TABLET | ORAL | Status: DC

## 2015-05-24 MED ORDER — HYDROCHLOROTHIAZIDE 25 MG PO TABS: 25.0000 mg | ORAL_TABLET | Freq: Every day | ORAL | Status: DC

## 2015-05-24 MED ORDER — METOPROLOL TARTRATE 50 MG PO TABS: 50.0000 mg | ORAL_TABLET | Freq: Two times a day (BID) | ORAL | Status: DC

## 2015-05-24 NOTE — Discharge Instructions (Signed)
Hypertension Hypertension, commonly called high blood pressure, is when the force of blood pumping through your arteries is too strong. Your arteries are the blood vessels that carry blood from your heart throughout your body. A blood pressure reading consists of a higher number over a lower number, such as 110/72. The higher number (systolic) is the pressure inside your arteries when your heart pumps. The lower number (diastolic) is the pressure inside your arteries when your heart relaxes. Ideally you want your blood pressure below 120/80. Hypertension forces your heart to work harder to pump blood. Your arteries may become narrow or stiff. Having untreated or uncontrolled hypertension can cause heart attack, stroke, kidney disease, and other problems. RISK FACTORS Some risk factors for high blood pressure are controllable. Others are not.  Risk factors you cannot control include:   Race. You may be at higher risk if you are African American.  Age. Risk increases with age.  Gender. Men are at higher risk than women before age 45 years. After age 65, women are at higher risk than men. Risk factors you can control include:  Not getting enough exercise or physical activity.  Being overweight.  Getting too much fat, sugar, calories, or salt in your diet.  Drinking too much alcohol. SIGNS AND SYMPTOMS Hypertension does not usually cause signs or symptoms. Extremely high blood pressure (hypertensive crisis) may cause headache, anxiety, shortness of breath, and nosebleed. DIAGNOSIS To check if you have hypertension, your health care provider will measure your blood pressure while you are seated, with your arm held at the level of your heart. It should be measured at least twice using the same arm. Certain conditions can cause a difference in blood pressure between your right and left arms. A blood pressure reading that is higher than normal on one occasion does not mean that you need treatment. If  it is not clear whether you have high blood pressure, you may be asked to return on a different day to have your blood pressure checked again. Or, you may be asked to monitor your blood pressure at home for 1 or more weeks. TREATMENT Treating high blood pressure includes making lifestyle changes and possibly taking medicine. Living a healthy lifestyle can help lower high blood pressure. You may need to change some of your habits. Lifestyle changes may include:  Following the DASH diet. This diet is high in fruits, vegetables, and whole grains. It is low in salt, red meat, and added sugars.  Keep your sodium intake below 2,300 mg per day.  Getting at least 30-45 minutes of aerobic exercise at least 4 times per week.  Losing weight if necessary.  Not smoking.  Limiting alcoholic beverages.  Learning ways to reduce stress. Your health care provider may prescribe medicine if lifestyle changes are not enough to get your blood pressure under control, and if one of the following is true:  You are 18-59 years of age and your systolic blood pressure is above 140.  You are 60 years of age or older, and your systolic blood pressure is above 150.  Your diastolic blood pressure is above 90.  You have diabetes, and your systolic blood pressure is over 140 or your diastolic blood pressure is over 90.  You have kidney disease and your blood pressure is above 140/90.  You have heart disease and your blood pressure is above 140/90. Your personal target blood pressure may vary depending on your medical conditions, your age, and other factors. HOME CARE INSTRUCTIONS    Have your blood pressure rechecked as directed by your health care provider.   Take medicines only as directed by your health care provider. Follow the directions carefully. Blood pressure medicines must be taken as prescribed. The medicine does not work as well when you skip doses. Skipping doses also puts you at risk for  problems.  Do not smoke.   Monitor your blood pressure at home as directed by your health care provider. SEEK MEDICAL CARE IF:   You think you are having a reaction to medicines taken.  You have recurrent headaches or feel dizzy.  You have swelling in your ankles.  You have trouble with your vision. SEEK IMMEDIATE MEDICAL CARE IF:  You develop a severe headache or confusion.  You have unusual weakness, numbness, or feel faint.  You have severe chest or abdominal pain.  You vomit repeatedly.  You have trouble breathing. MAKE SURE YOU:   Understand these instructions.  Will watch your condition.  Will get help right away if you are not doing well or get worse.   This information is not intended to replace advice given to you by your health care provider. Make sure you discuss any questions you have with your health care provider.   Document Released: 07/15/2005 Document Revised: 11/29/2014 Document Reviewed: 05/07/2013 Elsevier Interactive Patient Education 2016 [employer] Emergency Department Resource Guide 1) Find a Doctor and Pay Out of Pocket Although you won't have to find out who is covered by your insurance plan, it is a good idea to ask around and get recommendations. You will then need to call the office and see if the doctor you have chosen will accept you as a new patient and what types of options they offer for patients who are self-pay. Some doctors offer discounts or will set up payment plans for their patients who do not have insurance, but you will need to ask so you aren't surprised when you get to your appointment.  2) Contact Your Local Health Department Not all health departments have doctors that can see patients for sick visits, but many do, so it is worth a call to see if yours does. If you don't know where your local health department is, you can check in your phone book. The CDC also has a tool to help you locate your state's health department,  and many state websites also have listings of all of their local health departments.  3) Find a Walk-in Clinic If your illness is not likely to be very severe or complicated, you may want to try a walk in clinic. These are popping up all over the country in pharmacies, drugstores, and shopping centers. They're usually staffed by nurse practitioners or physician assistants that have been trained to treat common illnesses and complaints. They're usually fairly quick and inexpensive. However, if you have serious medical issues or chronic medical problems, these are probably not your best option.   Chronic Pain Problems: Organization         Address     Phone             Notes  [facility]  [phone] Patients need to be referred by their primary care doctor.   Medication Assistance: Organization         Address     Phone             Notes  [facility] [address] [place], [place] [phone] (  [phone] --Must be a resident of [facility] -- Must have NO insurance coverage whatsoever (no Medicaid/ Medicare, etc.) -- The pt. MUST have a primary care doctor that directs their care regularly and follows them in the community   [facility]ist  [phone]   [employer]  [phone]    Agencies that provide inexpensive medical care: Organization         Address     Phone             Notes  [facility]  [phone]   [facility]    [phone]   [facility] [address] [place], [place] [address] [phone]   [facility] [address] [place], [place] [phone]   Planned Parenthood    [phone]   [facility]    [phone]   [facility]  [address], [place] Phone:  [phone], Fax:  [phone] Hours of Operation:  9 am - 6 pm, M-F.  Also accepts Medicaid/Medicare and  self-pay.  [facility]  [address], [place] Phone: [phone], Fax: [phone]. Hours of Operation:  8:30 am - 5:30 pm, M-F.  Also accepts Medicaid and self-pay.  [facility] [place] [address] [place] Phone: [phone]   [facility]     [address] Natasha Bence [place], [place] [phone], Ext. [phone] Mondays & Thursdays: 7-9 AM.  First 15 patients are seen on a first come, first serve basis.   [facility] [address], [place] [address] [phone] Accepts Medicaid   Medicaid-accepting [facility] Providers:  Organization         Address     Phone             Notes  [facility] [address], [place] [phone] Also accepts self-pay patients.  [facility] [address] Laurell Josephs [place], [place]  [phone]   [facility] [address], [place] [phone]   [facility] [address], [place] [phone]   Renaye Rakers [address], [place]   [phone] Only accepts [place] Access [place] patients after they have their name applied to their card.   Self-Pay (no insurance) in [facility]:  Organization         Address     Phone             Notes  Sickle Cell Patients, [facility] Medicine [address] [place], [place] [phone]   [facility] [address] [place], [place] [phone]   [facility] [place]  [address], [place] [phone]   [facility]/Dr. Osei-Bonsu  [address] [place] or [address], [place] [phone] Phone number for both [place] and [place] locations is the same.  [facility] [address] [place] [phone]   [facility] [address], [place] or  [address] [phone]   [facility] [address] [place] [phone], phone; [phone], fax Sees patients 1st and 3rd Saturday of every month.  Must not  qualify for public or private insurance (i.e. Medicaid, Medicare, [place] Health Choice, Veterans' Benefits)  Household income should be no more than 200% of the poverty level The clinic cannot treat you if you are pregnant or think you are pregnant  Sexually transmitted diseases are not treated at the clinic.    Dental Care:  Organization         Address     Phone             Notes  [facility] of [facility] [address] [place], [place] [phone] Accepts children up to age 31 who are enrolled in [place] or [place] Health Choice; pregnant women with a Medicaid card; and children who have applied for Medicaid or [place] Health Choice, but were declined, whose parents can pay a reduced fee at time of service.  [facility] Department of [facility]  [address], [place] [phone] Accepts children up to age 8 who are enrolled in [place] or [place] Health Choice; pregnant women with a Medicaid card; and children who have applied for Medicaid or [place] Health Choice, but were declined, whose parents can pay a reduced fee at time of service.  [facility]  [address] [place], [place] [phone] Patients are seen by appointment only. Walk-ins are not accepted. [facility] will see patients 28 years of age and older. Monday - Tuesday (8am-5pm) Most Wednesdays (8:30-5pm) $30 per visit, cash only  [facility] Adult Dental Access PROGRAM  [address], [facility] [phone] Patients are seen by appointment only. Walk-ins are not accepted. [facility] will see patients 20 years of age and older. One Wednesday Evening (Monthly: Volunteer Based).  $30 per visit, cash only  [employer]  [phone] for adults; Children under age 59, call Graduate Pediatric Dentistry at [phone]. Children aged 43-14, please call [phone] to request a pediatric application.  Dental services are provided in all areas of dental care including fillings, crowns and bridges, complete and partial dentures, implants, gum treatment, root canals, and extractions. Preventive care is also provided. Treatment is provided to both adults and children. Patients are selected via a lottery and there is often a waiting list.   [facility] [address], [place]  [phone] [email or web address]   [facility] [address] [place], [place] [phone], Ext. [phone] Second and Fourth Thursday of each month, opens at 6:30 AM; Clinic ends at 9 AM.  Patients are seen on a first-come first-served basis, and a limited number are seen during each clinic.   [facility]  [address] Ether Griffins [place], [place] [phone]   Eligibility Requirements You must have lived in [place], [place], or [place] counties for at least the last three months.   You cannot be eligible for state or federal sponsored [place], including [employer], [place], or [employer].   You generally cannot be eligible for healthcare insurance through your employer.    How to apply: Eligibility screenings are held every Tuesday and Wednesday afternoon from 1:00 pm until 4:00 pm. You do not need an appointment for the interview!  [facility] [address] [place], [place] [phone]   [facility] Health Department  [phone]   [facility] Health Department  [phone]   [facility] Health Department  [phone]    Behavioral Health Resources in the Community:  Intensive Outpatient Programs Organization         Address     Phone             Notes  [facility] [address] [place], [place]  [phone]   [facility] Outpatient [address] [place], [place] [phone]   [facility]: [facility] Drug S[facility] [address], [place], [place]  [phone]   [facility] [address],  [place], [place] [phone] or [phone]     Substance Abuse Resources Organization         Address     Phone             Notes  Alcohol and Drug Services  [phone]   [facility]  [phone]   [facility] [place]  [phone]   Floydene Flock  [phone]   Residential & Outpatient Substance Abuse Program  [phone]   Psychological Services Organization         Address     Phone             Notes  [facility]  [phone]   [facility]  [phone]   [facility] [address] [place] [phone] or [phone]    Mobile Crisis Teams Organization         Address     Phone             Notes  [facility], Mobile Crisis Care Unit  [phone]   [facility]  [address] [place], [place] [phone]   Doristine Locks [address] [place] [phone]    Self-Help/Support Groups Organization         Address     Phone             Notes  [facility] [place] - variety of support groups  336- I7437963 Call for more information  Narcotics Anonymous (NA), [facility]aring [facility] [address], [employer] [place]  2 meetings at this location   Statistician         Address     Phone             Notes  [facility] [address],    [place]  [phone]   [facility]  [address] [place] [address], [place], [place] [phone]   [facility] Treatment [facility] [address] [place], [place] [phone] Admissions: 8am-3pm M-F  [facility] [address],    [place], [place] [phone]   [facility] [address] [place],  [place], [place] [phone]   [facility] [address],  [place], [place] [phone]   [facility] - Intensive Outpatient [address] Laurell Joseph[address], [place], [place] [phone]   [facility] (Addiction Recovery Care [facility]oc.) [address] [place].,  [place], [place] [phone] or [phone]   [facility] (RTS) [address] [place], [place] [phone] Accepts Medicaid  [facility] Ewa Gentry [address]  [place] [phone] Substance Abuse/Addiction Treatment   [facility] Organization         Address     Phone             Notes  [facility]  [phone]   Angie Fava, PhD [address] Skeeters [place], [place]   (906) [phone] or [phone]   Patrcia Dolly  [place]   [address] [place], [place] [phone]   [facility] [address], [place], [place] [phone] Insurance/Medicaid/sponsorship through [facility] and Families [address]                                    [employer], [place] [phone] Therapy/tele-psych/case  [facility] [address]   [place], [place] [phone]    Dr. Lolly Mustache  [phone]   [facility]  [facility] Dept. 1) [address], [place] 2) [address], [place] 3)  [address], [place] [phone]  ([phone]   [facility] Child Abuse Hotline 314-463-6970 or 317-614-3101 (After Hours)     [facility] Resources: Abuse and Neglect Organization         Address     Phone             Notes  Child/Elder Abuse Hotline  971-541-9754   Family Abuse Services  774-312-7172 24 hour crisis line  [facility]  [phone]   [facility] Domestic Violence Hotline  (605)465-1710    Behavioral Health & Substance Use Organization         Address     Phone             Notes  [employer]    9546936142 24 hour crisis line  [facility]  [address] [place] [phone] Monday- Friday, walk-in,  8am-8pm  [facility]SA Detoxification & Crisis Stabilization  604-368-2188   Alcoholics Anonymous 413-378-3822 [employer]  Narcotics Anonymous  818-773-5618     Health Clinics & Urgent Care Centers Organization         Address     Phone             Notes  [facility] Health Department  [phone]   [facility]  [phone]   [facility]  [phone]   [facility]  [phone] Uninsured patients meeting eligibility requirement  [facility] Health Services      [facility]  [phone]      [facility]     Health Center  [phone]      [facility]  [phone]      [facility]   [phone]     [facility]  [phone]     Additional [facility] Resources Organization         Address     Phone             Notes  [facility]  2318225664   [place]  786-767-2094 Medicaid, Nutrition, Medicine Assistance, Utility Assistance  [facility] (262)082-4767   [facility]  [phone]   [facility]  (779) 759-1244 [facility]  [facility] [place]  412-147-7723 Adult & family shelter, food, utility & rent assistance  24 Hour crisis line for those facing homelessness  [phone]   [facility] Transit  [phone] [place], [facility] public transportation system  Homecare Providers  248-064-0906 HIV/AIDS Case Management, FREE HIV SCREEN  Medication Management  7253901615 Ongoing medication assistance for patients meeting eligibility  requirements  Medication Drop Box Locations: [employer] Police Dept., [employer] Police Dept., [employer] Police Dept., [facility] office  Safely rid of unused medications  [employer]  3127273992 Crisis assistance, medication, housing, food, utility assistance  [employer] Info.  [facility])  [phone]

## 2015-05-24 NOTE — ED Provider Notes (Signed)
CSN: [id number]     Arrival date & time 05/24/15  [id number] History   First MD Initiated Contact with Patient 05/24/15 1005     Chief Complaint  Patient presents with  . Headache  . Medication Refill   HPI Pt has a history of HTN.  She ran out of her medications 1 week ago because she does not have insurance.  3 days ago she developed a throbbing aching headache in the left forehead and temporal region.   No fevers.  No vomiting.  No injury.  No numbness or weakness.  No trouble with vision or speech. Past Medical History  Diagnosis Date  . Hypertension   . Anxiety   . Bipolar 1 disorder (HCC)   . Post-traumatic stress syndrome    Past Surgical History  Procedure Laterality Date  . Tubal ligation    . Bladder surgery    . Cholecystectomy     History reviewed. No pertinent family history. Social History  Substance Use Topics  . Smoking status: Current Every Day Smoker -- 0.50 packs/day    Types: Cigarettes  . Smokeless tobacco: None  . Alcohol Use: No   OB History    No data available     Review of Systems  All other systems reviewed and are negative.     Allergies  Review of patient's allergies indicates no known allergies.  Home Medications   Prior to Admission medications   Medication Sig Start Date End Date Taking? Authorizing Provider  ALPRAZolam Prudy Feeler(XANAX) 1 MG tablet Take 1 tablet (1 mg total) by mouth 3 (three) times daily. 01/21/15   Gerhard Munchobert Lockwood, MD  cholecalciferol (VITAMIN D) 400 UNITS TABS tablet Take 3 tablets (1,200 Units total) by mouth 2 (two) times a week. Takes on Tuesday's and Thursday's 01/21/15   Gerhard Munchobert Lockwood, MD  hydrochlorothiazide (HYDRODIURIL) 25 MG tablet Take 1 tablet (25 mg total) by mouth daily. 03/04/15   Eyvonne MechanicJeffrey Hedges, PA-C  ibuprofen (ADVIL,MOTRIN) 200 MG tablet Take 400 mg by mouth 2 (two) times daily as needed for headache.     Historical Provider, MD  metoprolol (LOPRESSOR) 50 MG tablet Take 1 tablet (50 mg total) by mouth 2 (two)  times daily. 03/04/15   Eyvonne MechanicJeffrey Hedges, PA-C  QUEtiapine (SEROQUEL XR) 300 MG 24 hr tablet Take 1 tablet (300 mg total) by mouth at bedtime. 01/21/15   Gerhard Munchobert Lockwood, MD   BP 142/89 mmHg  Pulse 83  Temp(Src) 97.8 F (36.6 C) (Oral)  Resp 16  SpO2 100%  LMP 10/12/2011 Physical Exam  Constitutional: She appears well-developed and well-nourished. No distress.  HENT:  Head: Normocephalic and atraumatic.  Right Ear: External ear normal.  Left Ear: External ear normal.  Normal tympanic membranes bilaterally, no sinus tenderness  Eyes: Conjunctivae are normal. Right eye exhibits no discharge. Left eye exhibits no discharge. No scleral icterus.  Neck: Normal range of motion. Neck supple. No tracheal deviation present.  Cardiovascular: Normal rate, regular rhythm and intact distal pulses.   Pulmonary/Chest: Effort normal and breath sounds normal. No stridor. No respiratory distress. She has no wheezes. She has no rales.  Abdominal: Soft. Bowel sounds are normal. She exhibits no distension. There is no tenderness. There is no rebound and no guarding.  Musculoskeletal: She exhibits no edema or tenderness.  Neurological: She is alert. She has normal strength. No cranial nerve deficit (no facial droop, extraocular movements intact, no slurred speech) or sensory deficit. She exhibits normal muscle tone. She displays no seizure activity. Coordination  normal.  Skin: Skin is warm and dry. No rash noted.  Psychiatric: She has a normal mood and affect.  Nursing note and vitals reviewed.   ED Course  Procedures (including critical care time)   MDM   Final diagnoses:  Essential hypertension   The patient has a mild to moderate headache. She has no evidence of meningismus to suggest infection or acute hemorrhage. Neurologic exam is normal. I doubt stroke or other acute emergency condition.  Patient has chronic hypertension. She ran out of her medications. I will give her a refill of her blood  pressure medications as well as her Seroquel. Patient also requested a refill of her Xanax. She has been going to [facility]. Patient states every time she goes there they try to change her medications. The patient they may be trying to decrease her Xanax use. I explained to her that xanax is  not the best long-term medications for anxiety because of its high addiction potential.  I suggested she return to [facility] to discuss her medication regimen for her mental health issues.  I will refill her seroquel.      Linwood Dibbles, MD 05/24/15 [phone]

## 2015-05-24 NOTE — ED Notes (Signed)
Pt c/o headache x 1 week increasing x 3 days.  Pain score 6/10.  Pt reports taking ibuprofen w/ a slight relief.  Hx of HTN and reports that she ran out of medication x 1 week ago.  Pt has been unable to see PCP.

## 2015-11-09 ENCOUNTER — Encounter (HOSPITAL_COMMUNITY): Payer: Self-pay | Admitting: Psychiatry

## 2015-11-09 ENCOUNTER — Telehealth (HOSPITAL_COMMUNITY): Payer: Self-pay

## 2015-11-09 ENCOUNTER — Ambulatory Visit (INDEPENDENT_AMBULATORY_CARE_PROVIDER_SITE_OTHER): Payer: BLUE CROSS/BLUE SHIELD | Admitting: Psychiatry

## 2015-11-09 ENCOUNTER — Telehealth (HOSPITAL_COMMUNITY): Payer: Self-pay | Admitting: *Deleted

## 2015-11-09 VITALS — BP 125/91 | HR 97 | Ht 67.21 in | Wt 235.4 lb

## 2015-11-09 DIAGNOSIS — F411 Generalized anxiety disorder: Secondary | ICD-10-CM | POA: Diagnosis not present

## 2015-11-09 DIAGNOSIS — G4700 Insomnia, unspecified: Secondary | ICD-10-CM | POA: Insufficient documentation

## 2015-11-09 DIAGNOSIS — F4310 Post-traumatic stress disorder, unspecified: Secondary | ICD-10-CM

## 2015-11-09 DIAGNOSIS — F333 Major depressive disorder, recurrent, severe with psychotic symptoms: Secondary | ICD-10-CM | POA: Diagnosis not present

## 2015-11-09 DIAGNOSIS — F1010 Alcohol abuse, uncomplicated: Secondary | ICD-10-CM

## 2015-11-09 MED ORDER — VENLAFAXINE HCL ER 150 MG PO CP24: 150.0000 mg | ORAL_CAPSULE | Freq: Every day | ORAL | Status: DC

## 2015-11-09 MED ORDER — ALPRAZOLAM 2 MG PO TABS: 2.0000 mg | ORAL_TABLET | Freq: Every evening | ORAL | Status: DC | PRN

## 2015-11-09 MED ORDER — QUETIAPINE FUMARATE 300 MG PO TABS: 300.0000 mg | ORAL_TABLET | Freq: Every day | ORAL | Status: DC
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
## 2015-11-09 NOTE — Telephone Encounter (Signed)
Pharmacy called to notify MD that they have 2 scripts on file for Xanax for this patient. States that today she gave them one from Dr. Michae KavaAgarwal but she had one filled from Dr. Tenny Crawoss on 10/26/15 for 1mg  TID. Pharmacist Delorise ShinerGrace would like MD to clarify if she would like patient to have both filled. This Clinical research associatewriter informed pharmacist to hold on filling todays script for 2mg  Xanax once daily prn until MD can clarify. Pt has enough xanax until next week when MD can clarify to take prescribed dosage without cause for withdrawal.

## 2015-11-14 ENCOUNTER — Telehealth (HOSPITAL_COMMUNITY): Payer: Self-pay

## 2015-11-14 NOTE — Telephone Encounter (Signed)
Please fill Xanax next week

## 2015-11-14 NOTE — Telephone Encounter (Signed)
Patient called and said that the Effexor is making her sick to her stomach, I spoke to Dr. Michae KavaAgarwal and she had me call patient back and make sure that she is taking with food and trying an antacid - I called patient and left her a detailed voicemail and told her to call me back on Thursday and let me know

## 2015-11-14 NOTE — Telephone Encounter (Signed)
Called pharmacy and spoke with Pharmacist Vonna KotykJay, I was told that the prescription was filled on 4/13 right after they were told to hold it. The pharmacy tech, nor the Pharmacist seemed to be too concerned about the fact that the script was filled after being told to hold it. I was told there is nothing they can do about it.

## 2015-12-11 ENCOUNTER — Telehealth (HOSPITAL_COMMUNITY): Payer: Self-pay

## 2015-12-11 DIAGNOSIS — F333 Major depressive disorder, recurrent, severe with psychotic symptoms: Secondary | ICD-10-CM

## 2015-12-11 NOTE — Telephone Encounter (Signed)
Patient is calling to report that she can not take the Effexor it is causing all day nausea. She also needs a refill on her Xanax

## 2015-12-14 MED ORDER — SERTRALINE HCL 50 MG PO TABS
50.0000 mg | ORAL_TABLET | Freq: Every day | ORAL | Status: DC
Start: 2015-12-14 — End: 2016-03-05

## 2015-12-14 NOTE — Telephone Encounter (Signed)
Okay, I called the patient back to let her know and she was upset, she said she is 50 years old and she knows what works for her. I advised patient that if she would like to call her PCP to see if he would continue the prescription she can do that, but Dr. Michae Kavaagarwal is not going to refill at this time.

## 2015-12-14 NOTE — Telephone Encounter (Signed)
Pt was told that she would not be given further refills on Xanax. Is she willing to try another antidepressant? We can try Zoloft 50mg  qD. D/c the Effexor.

## 2015-12-14 NOTE — Telephone Encounter (Signed)
I sent the order for Zoloft to the pharmacy, I called the patient to let her know and she said that was fine. She was upset about not getting Xanax refilled. She said you told her that you would discontinue the Xanax if the Effexor worked, and it did not work so she feels like she "needs the Xanax to go with her other medications and she can feel normal." Please advise, thank you

## 2015-12-14 NOTE — Telephone Encounter (Signed)
I told her no more refills on Xanax

## 2016-01-11 ENCOUNTER — Ambulatory Visit (HOSPITAL_COMMUNITY): Payer: Self-pay | Admitting: Psychiatry

## 2016-02-03 ENCOUNTER — Other Ambulatory Visit (HOSPITAL_COMMUNITY): Payer: Self-pay | Admitting: Psychiatry

## 2016-03-05 ENCOUNTER — Other Ambulatory Visit (HOSPITAL_COMMUNITY): Payer: Self-pay

## 2016-03-05 DIAGNOSIS — F333 Major depressive disorder, recurrent, severe with psychotic symptoms: Secondary | ICD-10-CM

## 2016-03-05 MED ORDER — QUETIAPINE FUMARATE 300 MG PO TABS
300.0000 mg | ORAL_TABLET | Freq: Every day | ORAL | 0 refills | Status: DC
Start: 2016-03-05 — End: 2016-03-29

## 2016-03-05 MED ORDER — SERTRALINE HCL 50 MG PO TABS: 50.0000 mg | ORAL_TABLET | Freq: Every day | ORAL | 0 refills | Status: DC

## 2016-03-29 ENCOUNTER — Other Ambulatory Visit (HOSPITAL_COMMUNITY): Payer: Self-pay | Admitting: Psychiatry

## 2016-03-29 ENCOUNTER — Other Ambulatory Visit (HOSPITAL_COMMUNITY): Payer: Self-pay

## 2016-03-29 DIAGNOSIS — F333 Major depressive disorder, recurrent, severe with psychotic symptoms: Secondary | ICD-10-CM

## 2016-03-29 MED ORDER — QUETIAPINE FUMARATE 300 MG PO TABS: 300.0000 mg | ORAL_TABLET | Freq: Every day | ORAL | 0 refills | Status: DC

## 2016-04-02 ENCOUNTER — Other Ambulatory Visit (HOSPITAL_COMMUNITY): Payer: Self-pay

## 2016-04-02 DIAGNOSIS — F333 Major depressive disorder, recurrent, severe with psychotic symptoms: Secondary | ICD-10-CM

## 2016-04-02 MED ORDER — SERTRALINE HCL 50 MG PO TABS: 50.0000 mg | ORAL_TABLET | Freq: Every day | ORAL | 2 refills | Status: DC

## 2016-04-11 ENCOUNTER — Ambulatory Visit (HOSPITAL_COMMUNITY): Payer: Self-pay | Admitting: Psychiatry

## 2016-04-17 ENCOUNTER — Other Ambulatory Visit (HOSPITAL_COMMUNITY): Payer: Self-pay | Admitting: Psychiatry

## 2016-04-17 DIAGNOSIS — F4310 Post-traumatic stress disorder, unspecified: Secondary | ICD-10-CM

## 2016-04-17 DIAGNOSIS — F411 Generalized anxiety disorder: Secondary | ICD-10-CM

## 2016-04-17 DIAGNOSIS — G4700 Insomnia, unspecified: Secondary | ICD-10-CM

## 2016-04-18 ENCOUNTER — Telehealth (HOSPITAL_COMMUNITY): Payer: Self-pay

## 2016-04-18 NOTE — Telephone Encounter (Signed)
Patient is calling and is upset, she states that the sertraline is not working, she has been on it for long enough to know. Patient states she is still having panic attacks and that she knows what works for her and she would like to go back on Xanax. Patient says that she does not understand why "white folks can get all the xanax they want, but blacks are told that it is not prescribed anymore." I did explain to patient that this is not the case and that all of our doctors try not to prescribe benzos. I told her that I would talk to you and call her back....please advise, thank you

## 2016-04-18 NOTE — Telephone Encounter (Signed)
Will discuss at next scheduled appt 

## 2016-04-19 MED ORDER — QUETIAPINE FUMARATE 300 MG PO TABS: 300.0000 mg | ORAL_TABLET | Freq: Every day | ORAL | 0 refills | Status: DC

## 2016-04-19 NOTE — Telephone Encounter (Signed)
I called patient and let her know, she asked for a refill on the Seroquel, this was appropriate and a one month order was sent to the pharmacy.

## 2016-05-15 ENCOUNTER — Other Ambulatory Visit (HOSPITAL_COMMUNITY): Payer: Self-pay | Admitting: Psychiatry

## 2016-05-15 ENCOUNTER — Other Ambulatory Visit (HOSPITAL_COMMUNITY): Payer: Self-pay

## 2016-05-15 MED ORDER — QUETIAPINE FUMARATE 300 MG PO TABS: 300.0000 mg | ORAL_TABLET | Freq: Every day | ORAL | 0 refills | Status: DC

## 2016-05-30 ENCOUNTER — Ambulatory Visit (INDEPENDENT_AMBULATORY_CARE_PROVIDER_SITE_OTHER): Payer: BLUE CROSS/BLUE SHIELD | Admitting: Psychiatry

## 2016-05-30 ENCOUNTER — Encounter (HOSPITAL_COMMUNITY): Payer: Self-pay | Admitting: Psychiatry

## 2016-05-30 VITALS — BP 128/76 | HR 72 | Ht 67.0 in | Wt 235.2 lb

## 2016-05-30 DIAGNOSIS — Z79899 Other long term (current) drug therapy: Secondary | ICD-10-CM

## 2016-05-30 DIAGNOSIS — F5105 Insomnia due to other mental disorder: Secondary | ICD-10-CM | POA: Diagnosis not present

## 2016-05-30 DIAGNOSIS — R45850 Homicidal ideations: Secondary | ICD-10-CM

## 2016-05-30 DIAGNOSIS — F17210 Nicotine dependence, cigarettes, uncomplicated: Secondary | ICD-10-CM

## 2016-05-30 DIAGNOSIS — F411 Generalized anxiety disorder: Secondary | ICD-10-CM | POA: Diagnosis not present

## 2016-05-30 DIAGNOSIS — F332 Major depressive disorder, recurrent severe without psychotic features: Secondary | ICD-10-CM | POA: Diagnosis not present

## 2016-05-30 DIAGNOSIS — Z811 Family history of alcohol abuse and dependence: Secondary | ICD-10-CM

## 2016-05-30 DIAGNOSIS — Z813 Family history of other psychoactive substance abuse and dependence: Secondary | ICD-10-CM

## 2016-05-30 DIAGNOSIS — F4310 Post-traumatic stress disorder, unspecified: Secondary | ICD-10-CM

## 2016-05-30 DIAGNOSIS — F99 Mental disorder, not otherwise specified: Secondary | ICD-10-CM

## 2016-05-30 MED ORDER — QUETIAPINE FUMARATE 25 MG PO TABS: 25.0000 mg | ORAL_TABLET | Freq: Two times a day (BID) | ORAL | 3 refills | Status: DC | PRN

## 2016-05-30 MED ORDER — PAROXETINE HCL 20 MG PO TABS: 20.0000 mg | ORAL_TABLET | Freq: Every day | ORAL | 3 refills | Status: DC

## 2016-05-30 MED ORDER — QUETIAPINE FUMARATE 300 MG PO TABS: 300.0000 mg | ORAL_TABLET | Freq: Every day | ORAL | 3 refills | Status: DC
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
## 2016-06-11 ENCOUNTER — Other Ambulatory Visit (HOSPITAL_COMMUNITY): Payer: Self-pay | Admitting: Psychiatry

## 2016-09-05 ENCOUNTER — Ambulatory Visit (INDEPENDENT_AMBULATORY_CARE_PROVIDER_SITE_OTHER): Payer: BLUE CROSS/BLUE SHIELD | Admitting: Psychiatry

## 2016-09-05 ENCOUNTER — Encounter (HOSPITAL_COMMUNITY): Payer: Self-pay | Admitting: Psychiatry

## 2016-09-05 DIAGNOSIS — Z9049 Acquired absence of other specified parts of digestive tract: Secondary | ICD-10-CM

## 2016-09-05 DIAGNOSIS — F332 Major depressive disorder, recurrent severe without psychotic features: Secondary | ICD-10-CM | POA: Diagnosis not present

## 2016-09-05 DIAGNOSIS — F411 Generalized anxiety disorder: Secondary | ICD-10-CM

## 2016-09-05 DIAGNOSIS — F4310 Post-traumatic stress disorder, unspecified: Secondary | ICD-10-CM | POA: Diagnosis not present

## 2016-09-05 DIAGNOSIS — Z87891 Personal history of nicotine dependence: Secondary | ICD-10-CM

## 2016-09-05 DIAGNOSIS — Z79899 Other long term (current) drug therapy: Secondary | ICD-10-CM

## 2016-09-05 DIAGNOSIS — Z9889 Other specified postprocedural states: Secondary | ICD-10-CM

## 2016-09-05 DIAGNOSIS — F5105 Insomnia due to other mental disorder: Secondary | ICD-10-CM

## 2016-09-05 DIAGNOSIS — R45851 Suicidal ideations: Secondary | ICD-10-CM

## 2016-09-05 DIAGNOSIS — Z9851 Tubal ligation status: Secondary | ICD-10-CM

## 2016-09-05 DIAGNOSIS — F99 Mental disorder, not otherwise specified: Secondary | ICD-10-CM

## 2016-09-05 MED ORDER — PAROXETINE HCL 20 MG PO TABS: 20.0000 mg | ORAL_TABLET | Freq: Every day | ORAL | 3 refills | Status: DC

## 2016-09-05 MED ORDER — QUETIAPINE FUMARATE 400 MG PO TABS: 400.0000 mg | ORAL_TABLET | Freq: Every day | ORAL | 1 refills | Status: DC
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
## 2016-09-10 DIAGNOSIS — F319 Bipolar disorder, unspecified: Secondary | ICD-10-CM | POA: Diagnosis not present

## 2016-09-10 DIAGNOSIS — F411 Generalized anxiety disorder: Secondary | ICD-10-CM | POA: Diagnosis not present

## 2016-09-23 ENCOUNTER — Other Ambulatory Visit (HOSPITAL_COMMUNITY): Payer: Self-pay | Admitting: Psychiatry

## 2016-09-23 DIAGNOSIS — F5105 Insomnia due to other mental disorder: Secondary | ICD-10-CM

## 2016-09-23 DIAGNOSIS — F411 Generalized anxiety disorder: Secondary | ICD-10-CM

## 2016-09-23 DIAGNOSIS — F99 Mental disorder, not otherwise specified: Secondary | ICD-10-CM

## 2016-09-23 DIAGNOSIS — F4310 Post-traumatic stress disorder, unspecified: Secondary | ICD-10-CM

## 2016-09-23 DIAGNOSIS — F332 Major depressive disorder, recurrent severe without psychotic features: Secondary | ICD-10-CM

## 2016-10-31 ENCOUNTER — Other Ambulatory Visit (HOSPITAL_COMMUNITY): Payer: Self-pay | Admitting: Psychiatry

## 2016-10-31 DIAGNOSIS — F5105 Insomnia due to other mental disorder: Secondary | ICD-10-CM

## 2016-10-31 DIAGNOSIS — F411 Generalized anxiety disorder: Secondary | ICD-10-CM

## 2016-10-31 DIAGNOSIS — F332 Major depressive disorder, recurrent severe without psychotic features: Secondary | ICD-10-CM

## 2016-10-31 DIAGNOSIS — F4310 Post-traumatic stress disorder, unspecified: Secondary | ICD-10-CM

## 2016-10-31 DIAGNOSIS — F99 Mental disorder, not otherwise specified: Secondary | ICD-10-CM

## 2016-11-04 ENCOUNTER — Ambulatory Visit (INDEPENDENT_AMBULATORY_CARE_PROVIDER_SITE_OTHER): Payer: BLUE CROSS/BLUE SHIELD | Admitting: Psychiatry

## 2016-11-04 ENCOUNTER — Encounter (HOSPITAL_COMMUNITY): Payer: Self-pay | Admitting: Psychiatry

## 2016-11-04 DIAGNOSIS — Z811 Family history of alcohol abuse and dependence: Secondary | ICD-10-CM | POA: Diagnosis not present

## 2016-11-04 DIAGNOSIS — F411 Generalized anxiety disorder: Secondary | ICD-10-CM

## 2016-11-04 DIAGNOSIS — Z87891 Personal history of nicotine dependence: Secondary | ICD-10-CM | POA: Diagnosis not present

## 2016-11-04 DIAGNOSIS — F5105 Insomnia due to other mental disorder: Secondary | ICD-10-CM

## 2016-11-04 DIAGNOSIS — Z79899 Other long term (current) drug therapy: Secondary | ICD-10-CM

## 2016-11-04 DIAGNOSIS — F99 Mental disorder, not otherwise specified: Secondary | ICD-10-CM | POA: Diagnosis not present

## 2016-11-04 DIAGNOSIS — Z813 Family history of other psychoactive substance abuse and dependence: Secondary | ICD-10-CM | POA: Diagnosis not present

## 2016-11-04 DIAGNOSIS — F4310 Post-traumatic stress disorder, unspecified: Secondary | ICD-10-CM

## 2016-11-04 DIAGNOSIS — F332 Major depressive disorder, recurrent severe without psychotic features: Secondary | ICD-10-CM | POA: Diagnosis not present

## 2016-11-04 MED ORDER — QUETIAPINE FUMARATE 400 MG PO TABS: 400.0000 mg | ORAL_TABLET | Freq: Every day | ORAL | 1 refills | Status: DC

## 2016-11-04 MED ORDER — ALPRAZOLAM 2 MG PO TABS: 0.5000 mg | ORAL_TABLET | Freq: Four times a day (QID) | ORAL | 1 refills | Status: DC | PRN

## 2016-11-04 MED ORDER — METFORMIN HCL 500 MG PO TABS: 500.0000 mg | ORAL_TABLET | Freq: Every day | ORAL | 2 refills | Status: DC

## 2016-11-04 NOTE — Progress Notes (Signed)
BH MD/PA/NP OP Progress Note  11/04/2016 10:55 AM Jill [name]  MRN:  [id number]  Chief Complaint:  Chief Complaint    Follow-up     Subjective:  Jill [name] is a 51 year old female with a psychiatric history of bipolar 1 disorder. I spent time reviewing the patient's psychiatric history, and it appears that her last episode of mania was approximately one year ago whereby she was awake for approximately 5 days, with racing thoughts, irritability, anger, unable to sleep, and unable to work. She visited the emergency department requesting refill for her Seroquel, which was able to help calm her down. There was no identifiable trigger, no drug use at the time. Spent time educating the patient about bipolar disorder, and the typical medication management regimen, and medications to avoid.  Confirm that she takes Seroquel 400 mg nightly. Discussed the increased appetite and weight gain that she has had with this medication. She does not wish to switch medications, as this has been effective for her. I discussed the use of metformin for weight gain related to Seroquel, and she was agreeable to a trial of this medication.  Regarding her use of Xanax, she is prescribed 2 mg tablets, which are sectioned into 4 pieces of 0.5 mg, as they are scored in quarter tablets. We agreed to continue this prescription, as the patient generally uses one half of the prescription monthly, except during periods of stress, she will use the whole tablet, broken up throughout the day. 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 She is agreeable to following up with a therapist in this clinic. She denies any suicidal thoughts.   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 She has a fairly good support system with her daughter, friends, and her son. Her son has bipolar disorder and lives at home with her.  [facility] reviewed  Visit Diagnosis:    ICD-9-CM ICD-10-CM    1. Severe episode of recurrent major depressive disorder, without psychotic features (HCC) 296.33 F33.2 QUEtiapine (SEROQUEL) 400 MG tablet  2. GAD (generalized anxiety disorder) 300.02 F41.1 QUEtiapine (SEROQUEL) 400 MG tablet     alprazolam (XANAX) 2 MG tablet  3. PTSD (post-traumatic stress disorder) 309.81 F43.10 QUEtiapine (SEROQUEL) 400 MG tablet  4. Insomnia due to other mental disorder 300.9 F51.05 QUEtiapine (SEROQUEL) 400 MG tablet   327.02 F99     Past Psychiatric History: See intake H&P for full details. Reviewed, with no updates at this time.  Past Medical History:  Past Medical History:  Diagnosis Date  . Anxiety   . Bipolar 1 disorder (HCC)   . Hypertension   . Post-traumatic stress syndrome     Past Surgical History:  Procedure Laterality Date  . BLADDER SURGERY    . CHOLECYSTECTOMY    . TUBAL LIGATION      Family Psychiatric History: See intake H&P for full details. Reviewed, with no updates at this time.   Family History:  Family History  Problem Relation Age of Onset  . Drug abuse Mother   . Alcohol abuse Maternal Uncle   . Drug abuse Maternal Uncle     Social History:  Social History   Social History  . Marital status: Single    Spouse name: N/A  . Number of children: 2  . Years of education: 7   Social History Main Topics  . Smoking status: Former Smoker    Packs/day: 0.50  Types: Cigarettes, E-cigarettes    Quit date: 08/29/2016  . Smokeless tobacco: Never Used     Comment: patient is vaping and has cut back to 5 a week.   . Alcohol use No     Comment: 1.5 bottle of wine per night  . Drug use: No     Comment: randomly to calm her nerves  . Sexual activity: No   Other Topics Concern  . None   Social History Narrative   Pt lives with her son in [place]. 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 Born and raised in [place] by mom until 7 then by grandparents. Pt has one older brother. 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 Never married and has 2 kids.     Allergies: No Known Allergies  Metabolic Disorder Labs: No results found for: HGBA1C, MPG No results found for: PROLACTIN No results found for: CHOL, TRIG, HDL, CHOLHDL, VLDL, LDLCALC   Current Medications: Current Outpatient Prescriptions  Medication Sig Dispense Refill  . alprazolam (XANAX) 2 MG tablet Take 0.5 tablets (1 mg total) by mouth 4 (four) times daily as needed for sleep or anxiety. 30 tablet 1  . ibuprofen (ADVIL,MOTRIN) 200 MG tablet Take 400 mg by mouth every 6 (six) hours as needed for headache.     . QUEtiapine (SEROQUEL) 400 MG tablet Take 1 tablet (400 mg total) by mouth at bedtime. 90 tablet 1  . cholecalciferol (VITAMIN D) 400 UNITS TABS tablet Take 1,200 Units by mouth 2 (two) times a week. Reported on 11/09/2015    . hydrochlorothiazide (HYDRODIURIL) 25 MG tablet Take 25 mg by mouth daily. Reported on 11/09/2015    . metFORMIN (GLUCOPHAGE) 500 MG tablet Take 1 tablet (500 mg total) by mouth daily with breakfast. 30 tablet 2  . metoprolol (LOPRESSOR) 50 MG tablet Take 50 mg by mouth 2 (two) times daily.     No current facility-administered medications for this visit.     Neurologic: Headache: Negative Seizure: Negative Paresthesias: Negative  Musculoskeletal: Strength & Muscle Tone: within normal limits Gait & Station: normal Patient leans: N/A  Psychiatric Specialty Exam: Review of Systems  Psychiatric/Behavioral: The patient is nervous/anxious.   All other systems reviewed and are negative.   Blood pressure 130/90, pulse 94, height  (1.702 m), weight 249 lb (112.9 kg), last menstrual period 10/12/2011, SpO2 98 %.Body mass index is 39 kg/m.  General Appearance: Casual and Fairly Groomed  Eye Contact:  Good  Speech:  Clear and Coherent  Volume:  Normal  Mood:  Euthymic  Affect:  Appropriate  Thought Process:  Goal Directed  Orientation:  Full (Time, Place, and Person)  Thought Content: Logical    Suicidal Thoughts:  No  Homicidal Thoughts:  No  Memory:  Immediate;   Good  Judgement:  Fair  Insight:  Shallow  Psychomotor Activity:  Normal  Concentration:  Concentration: Good  Recall:  Good  Fund of Knowledge: Good  Language: Good  Akathisia:  Negative  Handed:  Right  AIMS (if indicated):  na  Assets:  Communication Skills Desire for Improvement Financial Resources/Insurance Housing Leisure Time Resilience Social Support Talents/Skills Transportation Vocational/Educational  ADL's:  Intact  Cognition: WNL  Sleep:  7-8 hours nightly   Treatment Plan Summary: Jill [name] is a 51 year old female with bipolar disorder and PTSD who presents today for transfer of care. She required more availability than previous provider had, and also wished to remain on her current  dose of alprazolam. Reviewing [employer], it does not appear that she has any inappropriate use of the medication, and has received recent refills from her primary care physician, Dr. Tenny Craw.  She has what appears to be a consistent diagnosis of bipolar disorder, and weighing the risks and benefits, I agreed to renew the patient's alprazolam for ongoing therapy.  Ideally I would switch her to a less metabolically caustic medication for mood stabilizer, but she feels stable and comfortable with the current therapy with Seroquel. She remains on Seroquel 400 mg nightly for mood stabilization, and we will initiate metformin given the deleterious metabolic effects.  1. Severe episode of recurrent major depressive disorder, without psychotic features (HCC)   2. GAD (generalized anxiety disorder)   3. PTSD (post-traumatic stress disorder)   4. Insomnia due to other mental disorder    Continue Seroquel 400 mg nightly Continue alprazolam 0.5 mg to 2 mg daily as needed for panic and anxiety Initiate metformin given the metabolic effects of Seroquel Follow-up with writer in 8 weeks Schedule therapy intake at  this clinic  Burnard Leigh, MD 11/04/2016, 10:55 AM

## 2016-11-28 ENCOUNTER — Other Ambulatory Visit (HOSPITAL_COMMUNITY): Payer: Self-pay

## 2016-11-28 DIAGNOSIS — F411 Generalized anxiety disorder: Secondary | ICD-10-CM

## 2016-11-28 MED ORDER — ALPRAZOLAM 2 MG PO TABS: ORAL_TABLET | ORAL | 1 refills | Status: DC

## 2016-12-30 ENCOUNTER — Ambulatory Visit (HOSPITAL_COMMUNITY): Payer: Self-pay | Admitting: Psychiatry

## 2017-01-22 ENCOUNTER — Ambulatory Visit (INDEPENDENT_AMBULATORY_CARE_PROVIDER_SITE_OTHER): Payer: BLUE CROSS/BLUE SHIELD | Admitting: Psychiatry

## 2017-01-22 ENCOUNTER — Telehealth (HOSPITAL_COMMUNITY): Payer: Self-pay

## 2017-01-22 ENCOUNTER — Encounter (HOSPITAL_COMMUNITY): Payer: Self-pay | Admitting: Psychiatry

## 2017-01-22 VITALS — BP 142/80 | HR 82 | Ht 67.0 in | Wt 238.6 lb

## 2017-01-22 DIAGNOSIS — Z7984 Long term (current) use of oral hypoglycemic drugs: Secondary | ICD-10-CM | POA: Diagnosis not present

## 2017-01-22 DIAGNOSIS — Z79899 Other long term (current) drug therapy: Secondary | ICD-10-CM | POA: Diagnosis not present

## 2017-01-22 DIAGNOSIS — F4310 Post-traumatic stress disorder, unspecified: Secondary | ICD-10-CM | POA: Diagnosis not present

## 2017-01-22 DIAGNOSIS — Z813 Family history of other psychoactive substance abuse and dependence: Secondary | ICD-10-CM

## 2017-01-22 DIAGNOSIS — Z811 Family history of alcohol abuse and dependence: Secondary | ICD-10-CM | POA: Diagnosis not present

## 2017-01-22 DIAGNOSIS — F17210 Nicotine dependence, cigarettes, uncomplicated: Secondary | ICD-10-CM

## 2017-01-22 DIAGNOSIS — F332 Major depressive disorder, recurrent severe without psychotic features: Secondary | ICD-10-CM

## 2017-01-22 DIAGNOSIS — F411 Generalized anxiety disorder: Secondary | ICD-10-CM

## 2017-01-22 MED ORDER — QUETIAPINE FUMARATE ER 400 MG PO TB24: 400.0000 mg | ORAL_TABLET | Freq: Every day | ORAL | 1 refills | Status: DC

## 2017-01-22 MED ORDER — ALPRAZOLAM ER 2 MG PO TB24: 2.0000 mg | ORAL_TABLET | ORAL | 2 refills | Status: DC

## 2017-01-22 MED ORDER — METFORMIN HCL 500 MG PO TABS: 500.0000 mg | ORAL_TABLET | Freq: Every day | ORAL | 2 refills | Status: DC

## 2017-01-22 MED ORDER — NICOTINE 14 MG/24HR TD PT24: 14.0000 mg | MEDICATED_PATCH | Freq: Every day | TRANSDERMAL | 0 refills | Status: DC

## 2017-01-22 MED ORDER — NICOTINE 7 MG/24HR TD PT24: 7.0000 mg | MEDICATED_PATCH | Freq: Every day | TRANSDERMAL | 0 refills | Status: DC

## 2017-01-22 NOTE — Telephone Encounter (Signed)
Fair enough.  She can just continue the seroquel 400 mg immediate release.  She already has a refill on that

## 2017-01-22 NOTE — Telephone Encounter (Signed)
Patient called from the pharmacy, the Seroquel is 100 dollars, it is 45 with [facility], but those are both too expensive. Please review and advise, thank you

## 2017-01-22 NOTE — Progress Notes (Signed)
BH MD/PA/NP OP Progress Note  01/22/2017 11:09 AM Jill [name]  MRN:  [id number]  Chief Complaint: med management, anxiety  Subjective:  Jill [name] reports that her mood has been okay, no significant depressive symptoms. She reports that her major issues been related to some breakthrough anxiety and panic symptoms throughout the day. She reports that Xanax 1 mg 3 times a day has been helpful for her in the past.  Discussed my concern that given the habit-forming nature of Xanax, I tend to like to use long-acting medication, such as clonazepam, or even lorazepam.  She was agreeable to switch to Xanax extended release once daily in the morning. We also discussed transition to Seroquel extended release nightly so that it may afford her more anxiety control during the day. She is not having any manic symptoms, and reports that she tends to sleep fairly well. Denies any acute safety issues, and reports that she tends to like her hobbies at home such as television shows and video games. She continues to work her job full time but is looking for different employment due to stressors with management.  Visit Diagnosis:    ICD-10-CM   1. Severe episode of recurrent major depressive disorder, without psychotic features (HCC) F33.2 ALPRAZolam (XANAX XR) 2 MG 24 hr tablet    QUEtiapine (SEROQUEL XR) 400 MG 24 hr tablet    nicotine (NICOTINE STEP 2) 14 mg/24hr patch    nicotine (NICOTINE STEP 3) 7 mg/24hr patch  2. GAD (generalized anxiety disorder) F41.1 ALPRAZolam (XANAX XR) 2 MG 24 hr tablet    QUEtiapine (SEROQUEL XR) 400 MG 24 hr tablet    nicotine (NICOTINE STEP 2) 14 mg/24hr patch    nicotine (NICOTINE STEP 3) 7 mg/24hr patch  3. PTSD (post-traumatic stress disorder) F43.10 ALPRAZolam (XANAX XR) 2 MG 24 hr tablet    QUEtiapine (SEROQUEL XR) 400 MG 24 hr tablet    nicotine (NICOTINE STEP 2) 14 mg/24hr patch    nicotine (NICOTINE STEP 3) 7 mg/24hr patch    Past Psychiatric History: See intake  H&P for full details. Reviewed, with no updates at this time.   Past Medical History:  Past Medical History:  Diagnosis Date  . Anxiety   . Bipolar 1 disorder (HCC)   . Hypertension   . Post-traumatic stress syndrome     Past Surgical History:  Procedure Laterality Date  . BLADDER SURGERY    . CHOLECYSTECTOMY    . TUBAL LIGATION      Family Psychiatric History: See intake H&P for full details. Reviewed, with no updates at this time.   Family History:  Family History  Problem Relation Age of Onset  . Drug abuse Mother   . Alcohol abuse Maternal Uncle   . Drug abuse Maternal Uncle     Social History:  Social History   Social History  . Marital status: Single    Spouse name: N/A  . Number of children: 2  . Years of education: 53   Social History Main Topics  . Smoking status: Current Every Day Smoker    Packs/day: 0.50    Types: Cigarettes    Last attempt to quit: 08/29/2016  . Smokeless tobacco: Never Used     Comment: patient is vaping and has cut back to 5 a week.   . Alcohol use No  . Drug use: No  . Sexual activity: No   Other Topics Concern  . None   Social History Narrative   [place]. 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 Born and raised in [place] by mom until 7 then by grandparents. Pt has one older brother. 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 Never married and has 2 kids.     Allergies: No Known Allergies  Metabolic Disorder Labs: No results found for: HGBA1C, MPG No results found for: PROLACTIN No results found for: CHOL, TRIG, HDL, CHOLHDL, VLDL, LDLCALC   Current Medications: Current Outpatient Prescriptions  Medication Sig Dispense Refill  . cholecalciferol (VITAMIN D) 400 UNITS TABS tablet Take 1,200 Units by mouth 2 (two) times a week. Reported on 11/09/2015    . hydrochlorothiazide (HYDRODIURIL) 25 MG tablet Take 25 mg by mouth daily. Reported on 11/09/2015    . ibuprofen (ADVIL,MOTRIN) 200 MG  tablet Take 400 mg by mouth every 6 (six) hours as needed for headache.     . metFORMIN (GLUCOPHAGE) 500 MG tablet Take 1 tablet (500 mg total) by mouth daily with breakfast. 30 tablet 2  . metoprolol (LOPRESSOR) 50 MG tablet Take 50 mg by mouth 2 (two) times daily.    Marland Kitchen. ALPRAZolam (XANAX XR) 2 MG 24 hr tablet Take 1 tablet (2 mg total) by mouth every morning. 30 tablet 2  . nicotine (NICOTINE STEP 2) 14 mg/24hr patch Place 1 patch (14 mg total) onto the skin daily. 28 patch 0  . [START ON 02/19/2017] nicotine (NICOTINE STEP 3) 7 mg/24hr patch Place 1 patch (7 mg total) onto the skin daily. 28 patch 0  . QUEtiapine (SEROQUEL XR) 400 MG 24 hr tablet Take 1 tablet (400 mg total) by mouth at bedtime. 90 tablet 1   No current facility-administered medications for this visit.     Neurologic: Headache: Negative Seizure: Negative Paresthesias: Negative  Musculoskeletal: Strength & Muscle Tone: within normal limits Gait & Station: normal Patient leans: N/A  Psychiatric Specialty Exam: ROS  Blood pressure (!) 142/80, pulse 82, height 5\' 7"  (1.702 m), weight 238 lb 9.6 oz (108.2 kg), last menstrual period 10/12/2011.Body mass index is 37.37 kg/m.  General Appearance: Casual and Fairly Groomed  Eye Contact:  Good  Speech:  Clear and Coherent  Volume:  Normal  Mood:  Euthymic  Affect:  Congruent  Thought Process:  Coherent and Goal Directed  Orientation:  Full (Time, Place, and Person)  Thought Content: Logical   Suicidal Thoughts:  No  Homicidal Thoughts:  No  Memory:  Immediate;   Good  Judgement:  Fair  Insight:  Shallow  Psychomotor Activity:  Normal  Concentration:  Concentration: Fair  Recall:  Fair  Fund of Knowledge: Good  Language: Good  Akathisia:  Negative  Handed:  Right  AIMS (if indicated):  0  Assets:  Communication Skills Desire for Improvement Financial Resources/Insurance Housing Social Support Transportation Vocational/Educational  ADL's:  Intact   Cognition: WNL  Sleep:  6-8 hours    Treatment Plan Summary: Jill [name] is a 51 year old female with a history of bipolar disorder and PTSD. Her presentation does appear to be consistent with bipolar affective spectrum complicated by PTSD. She continues to struggle with some breakthrough anxiety during the day, so we discussed transitioning to an extended release dosing for Seroquel and Xanax. No acute safety issues and she will follow up in 3 months.  1. Severe episode of recurrent major depressive disorder, without psychotic features (HCC)   2. GAD (generalized anxiety disorder)   3. PTSD (post-traumatic stress disorder)  Seroquel 400 mg extended-release Alprazolam 2 mg extended-release Metformin for weight gain related to Seroquel Follow-up in 8 weeks Encourage therapy, but the patient is yet to schedule this  Burnard Leigh, MD 01/22/2017, 11:09 AM

## 2017-01-23 NOTE — Telephone Encounter (Signed)
Called patient and let her know to continue current Seroquel. Patient voiced her understanding.

## 2017-01-27 ENCOUNTER — Telehealth (HOSPITAL_COMMUNITY): Payer: Self-pay

## 2017-01-27 NOTE — Telephone Encounter (Signed)
I'd suggest she try taking the 2 mg XR at night and see if this does better for her, and let us know how that goes.  Alternatively we can go to 1 mg XR.

## 2017-01-27 NOTE — Telephone Encounter (Signed)
Patient is calling because the 2 mg Xanax XR are too strong. Patient said she is better on the regular Xanax. She would like to know if it can be changed to 1 mg bid. Patient says she took one tab of the 2 mg XR and slept through a work shift - she can not cut them in half according to the pharmacy because it is XR. Please review and advise, thank you

## 2017-01-28 MED ORDER — ALPRAZOLAM ER 1 MG PO TB24: 1.0000 mg | ORAL_TABLET | Freq: Every day | ORAL | 2 refills | Status: DC

## 2017-01-28 NOTE — Telephone Encounter (Signed)
Called pharmacy and discontinues the order for Xanax 2 mg and submitted the Xanax XR 1 mg 1 po qhs

## 2017-01-28 NOTE — Telephone Encounter (Signed)
I called and spoke to patient, she has tried taking it at night and it is still making her too sleepy. I can send the order for 1 mg XR, what is the sig?

## 2017-01-28 NOTE — Telephone Encounter (Signed)
Dispense 30 days, 1 refill. Thank you!

## 2017-02-05 ENCOUNTER — Telehealth (HOSPITAL_COMMUNITY): Payer: Self-pay

## 2017-02-05 NOTE — Telephone Encounter (Signed)
Patient is calling to say that the 1 mg XR of Xanax is not working and she would like to go back to the 2 mg ZR. Please review and advise, thank you

## 2017-02-06 NOTE — Telephone Encounter (Signed)
She should have at least 3 weeks of the 2 mg tablets left over, since we changed her to the 1 mg tablets after only 1 week.  Whenever she runs out of the 2 mg tablets, she should give us a call to send in the new prescription

## 2017-02-06 NOTE — Telephone Encounter (Signed)
I called the patient and let her know she could go back to 2 tablets a day. Patient states she would rather have a 2 mg tablet because taking two 1 mg tabs is not the same, patient states she is still having some anxiety doing that. So patient is asking for a prescription of 2 mg XR 1 po qd. Please review and advise, thank you

## 2017-02-06 NOTE — Telephone Encounter (Signed)
I called patient and let her know- she will call when she has a few days left.

## 2017-02-06 NOTE — Telephone Encounter (Signed)
That's fine. She should have plenty left over of the 2 mg doses for another few weeks, and the original prescription that I wrote for her has 2 refills since we are q3 months visits.

## 2017-02-17 ENCOUNTER — Other Ambulatory Visit (HOSPITAL_COMMUNITY): Payer: Self-pay | Admitting: Psychiatry

## 2017-02-17 DIAGNOSIS — F411 Generalized anxiety disorder: Secondary | ICD-10-CM

## 2017-02-17 DIAGNOSIS — F332 Major depressive disorder, recurrent severe without psychotic features: Secondary | ICD-10-CM

## 2017-02-17 DIAGNOSIS — F4310 Post-traumatic stress disorder, unspecified: Secondary | ICD-10-CM

## 2017-02-27 ENCOUNTER — Other Ambulatory Visit (HOSPITAL_COMMUNITY): Payer: Self-pay

## 2017-02-27 MED ORDER — ALPRAZOLAM ER 2 MG PO TB24: 2.0000 mg | ORAL_TABLET | ORAL | 0 refills | Status: DC

## 2017-03-10 ENCOUNTER — Emergency Department (HOSPITAL_COMMUNITY)
Admission: EM | Admit: 2017-03-10 | Discharge: 2017-03-10 | Disposition: A | Discharge status: Home / Self Care | Payer: BLUE CROSS/BLUE SHIELD | Attending: Emergency Medicine | Admitting: Emergency Medicine

## 2017-03-10 ENCOUNTER — Encounter (HOSPITAL_COMMUNITY): Payer: Self-pay | Admitting: Emergency Medicine

## 2017-03-10 DIAGNOSIS — R590 Localized enlarged lymph nodes: Secondary | ICD-10-CM | POA: Diagnosis not present

## 2017-03-10 DIAGNOSIS — Z79899 Other long term (current) drug therapy: Secondary | ICD-10-CM | POA: Insufficient documentation

## 2017-03-10 DIAGNOSIS — K0889 Other specified disorders of teeth and supporting structures: Secondary | ICD-10-CM | POA: Diagnosis not present

## 2017-03-10 DIAGNOSIS — K029 Dental caries, unspecified: Secondary | ICD-10-CM | POA: Diagnosis not present

## 2017-03-10 DIAGNOSIS — F17210 Nicotine dependence, cigarettes, uncomplicated: Secondary | ICD-10-CM | POA: Diagnosis not present

## 2017-03-10 DIAGNOSIS — I10 Essential (primary) hypertension: Secondary | ICD-10-CM | POA: Insufficient documentation

## 2017-03-10 DIAGNOSIS — K047 Periapical abscess without sinus: Secondary | ICD-10-CM

## 2017-03-10 MED ORDER — AMOXICILLIN 500 MG PO CAPS: 500.0000 mg | ORAL_CAPSULE | Freq: Three times a day (TID) | ORAL | 0 refills | Status: DC

## 2017-03-10 MED ORDER — NAPROXEN 375 MG PO TABS: 375.0000 mg | ORAL_TABLET | Freq: Two times a day (BID) | ORAL | 0 refills | Status: DC

## 2017-03-10 MED ORDER — AMOXICILLIN 500 MG PO CAPS
500.0000 mg | ORAL_CAPSULE | Freq: Once | ORAL | Status: AC
  Administered 2017-03-10: 500 mg via ORAL
  Filled 2017-03-10: qty 1

## 2017-03-10 MED ORDER — HYDROCODONE-ACETAMINOPHEN 5-325 MG PO TABS
1.0000 | ORAL_TABLET | Freq: Once | ORAL | Status: AC
  Administered 2017-03-10: 1 via ORAL
  Filled 2017-03-10: qty 1

## 2017-03-10 NOTE — ED Provider Notes (Signed)
[facility]-EMERGENCY DEPT Provider Note   CSN: [id number] Arrival date & time: 03/10/17  1505  By signing my name below, I, [name], attest that this documentation has been prepared under the direction and in the presence of [name], NP Electronically Signed: [name], ED Scribe. 03/10/17. 6:39 PM.  History   Chief Complaint Chief Complaint  Patient presents with  . Dental Pain   The history is provided by the patient. No language interpreter was used.   HPI Comments: Jill [name] is a 51 y.o. female who presents to the Emergency Department complaining of gradually worsening, persistent right lower dental pain that began 4 days ago. The pt states that she took some ibuprofen with mild relief. She denies fever, ear pain, nausea, and vomiting. She has had her other 3 wisdom teeth removed for similar problems. She did not realize this tooth was decayed until she started having pain and looked at it.   Past Medical History:  Diagnosis Date  . Anxiety   . Bipolar 1 disorder (HCC)   . Hypertension   . Post-traumatic stress syndrome     Patient Active Problem List   Diagnosis Date Noted  . Alcohol abuse 11/09/2015  . Insomnia 11/09/2015  . PTSD (post-traumatic stress disorder) 11/09/2015  . GAD (generalized anxiety disorder) 11/09/2015  . 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 BLADDER SURGERY    . CHOLECYSTECTOMY    . TUBAL LIGATION      OB History    No data available       Home Medications    Prior to Admission medications   Medication Sig Start Date End Date Taking? Authorizing Provider  ALPRAZolam (ALPRAZOLAM XR) 2 MG 24 hr tablet Take 1 tablet (2 mg total) by mouth every morning. 02/27/17   Eksir, Bo McclintockAlexander Arya, MD  amoxicillin (AMOXIL) 500 MG capsule Take 1 capsule (500 mg total) by mouth 3 (three) times daily. 03/10/17   Janne NapoleonNeese, Michaela Shankel M,  NP  cholecalciferol (VITAMIN D) 400 UNITS TABS tablet Take 1,200 Units by mouth 2 (two) times a week. Reported on 11/09/2015    [provider]  hydrochlorothiazide (HYDRODIURIL) 25 MG tablet Take 25 mg by mouth daily. Reported on 11/09/2015    [provider]  ibuprofen (ADVIL,MOTRIN) 200 MG tablet Take 400 mg by mouth every 6 (six) hours as needed for headache.     [provider]  metFORMIN (GLUCOPHAGE) 500 MG tablet Take 1 tablet (500 mg total) by mouth daily with breakfast. 01/22/17 01/22/18  Burnard LeighEksir, Alexander Arya, MD  metoprolol (LOPRESSOR) 50 MG tablet Take 50 mg by mouth 2 (two) times daily.    [provider]  naproxen (NAPROSYN) 375 MG tablet Take 1 tablet (375 mg total) by mouth 2 (two) times daily. 03/10/17   Janne NapoleonNeese, Deshondra Worst M, NP  nicotine (NICOTINE STEP 2) 14 mg/24hr patch Place 1 patch (14 mg total) onto the skin daily. 01/22/17   Eksir, Bo McclintockAlexander Arya, MD  NICOTINE STEP 3 7 MG/24HR patch PLACE 1 PATCH (7 MG TOTAL) ONTO THE SKIN DAILY. 02/17/17   Burnard LeighEksir, Alexander Arya, MD  QUEtiapine (SEROQUEL XR) 400 MG 24 hr tablet Take 1 tablet (400 mg total) by mouth at bedtime. 01/22/17   Eksir, Bo McclintockAlexander Arya, MD    Family History Family History  Problem Relation Age of Onset  . Drug abuse Mother   . Alcohol abuse Maternal Uncle   .  Drug abuse Maternal Uncle     Social History Social History  Substance Use Topics  . Smoking status: Current Every Day Smoker    Packs/day: 0.50    Types: Cigarettes    Last attempt to quit: 08/29/2016  . Smokeless tobacco: Never Used     Comment: patient is vaping and has cut back to 5 a week.   . Alcohol use No     Allergies   Patient has no known allergies.   Review of Systems Review of Systems  Constitutional: Negative for chills and fever.  HENT: Positive for dental problem and ear pain (right). Negative for facial swelling and sore throat.   Gastrointestinal: Negative for nausea and vomiting.  Musculoskeletal:  Negative for neck pain.  Skin: Negative for wound.  Neurological: Negative for headaches.  Psychiatric/Behavioral: The patient is not nervous/anxious.      Physical Exam Updated Vital Signs BP 135/85 (BP Location: Right Arm)   Pulse 65   Temp 98.4 F (36.9 C) (Oral)   Resp 16   LMP 10/12/2011   SpO2 99%   Physical Exam  Constitutional: She appears well-developed and well-nourished. No distress.  HENT:  Head: Normocephalic.  Right Ear: Tympanic membrane normal.  Left Ear: Tympanic membrane normal.  Nose: Nose normal. Right sinus exhibits no maxillary sinus tenderness and no frontal sinus tenderness. Left sinus exhibits no maxillary sinus tenderness and no frontal sinus tenderness.  Mouth/Throat: Uvula is midline, oropharynx is clear and moist and mucous membranes are normal. No oral lesions. Abnormal dentition. Dental caries present. No uvula swelling or lacerations.    Right lower 3rd molar with decay. Swelling to the gum surrounding the tooth. Tender on exam.  Eyes: Pupils are equal, round, and reactive to light. Conjunctivae are normal. Right eye exhibits no discharge. Left eye exhibits no discharge.  Neck: Normal range of motion. Neck supple.  Anterior cervical adenopathy. No mastoid tenderness.   Cardiovascular: Normal rate and regular rhythm.   Pulmonary/Chest: Effort normal. No respiratory distress.  Equal chest rise  Abdominal: Soft. Bowel sounds are normal. She exhibits no distension. There is no tenderness.  Musculoskeletal: Normal range of motion.  Lymphadenopathy:    She has cervical adenopathy.  Neurological: She is alert.  Skin: Skin is warm and dry.  Psychiatric: She has a normal mood and affect.  Nursing note and vitals reviewed.    ED Treatments / Results   DIAGNOSTIC STUDIES: Oxygen Saturation is 100% on RA, normal by my interpretation.   COORDINATION OF CARE: 5:41 PM-Discussed next steps with pt. Pt verbalized understanding and is agreeable with  the plan.   Labs (all labs ordered are listed, but only abnormal results are displayed) Labs Reviewed - No data to display  Radiology No results found.  Procedures Procedures (including critical care time)  Medications Ordered in ED Medications  HYDROcodone-acetaminophen (NORCO/VICODIN) 5-325 MG per tablet 1 tablet (1 tablet Oral Given 03/10/17 1753)  amoxicillin (AMOXIL) capsule 500 mg (500 mg Oral Given 03/10/17 1753)     Initial Impression / Assessment and Plan / ED Course  I have reviewed the triage vital signs and the nursing notes.  Here for evaluation of dental pain. On exam, there is no evidence of a drainable abscess. No trismus, glossal elevation, unilateral tonsillar swelling. No evidence of retropharyngeal or peritonsillar abscess or Ludwig angina. Discharged with instructions to f/u with oral surgeon. Patient states her insurance will go into effect next week. Also given prescription for anti-inflammatories and antibiotics. Overall, appears  well, nontoxic and appropriate for discharge.  Final Clinical Impressions(s) / ED Diagnoses   Final diagnoses:  Infected dental caries    New Prescriptions Discharge Medication List as of 03/10/2017  5:46 PM    START taking these medications   Details  amoxicillin (AMOXIL) 500 MG capsule Take 1 capsule (500 mg total) by mouth 3 (three) times daily., Starting Mon 03/10/2017, Print    naproxen (NAPROSYN) 375 MG tablet Take 1 tablet (375 mg total) by mouth 2 (two) times daily., Starting Mon 03/10/2017, Print      I personally performed the services described in this documentation, which was scribed in my presence. The recorded information has been reviewed and is accurate.    Kerrie Buffalo [place], [place] 03/10/17 1844    Nira Conn, MD 03/11/17 [phone]

## 2017-03-10 NOTE — ED Triage Notes (Signed)
Pt states that she has had R lower dental pain x 4 days in her wisdom tooth. Tried 800mg  ibuprofen at home w/o relief. Alert and oriented.

## 2017-03-10 NOTE — Discharge Instructions (Signed)
Follow up with a dentist or oral surgeon as soon as possible. Return here as needed.

## 2017-03-10 NOTE — ED Notes (Signed)
Pt ambulatory and independent at discharge.  Verbalized understanding of discharge instructions and importance of not driving while taking pain medication.  Picked up by daughter.

## 2017-03-25 ENCOUNTER — Ambulatory Visit (INDEPENDENT_AMBULATORY_CARE_PROVIDER_SITE_OTHER): Payer: BLUE CROSS/BLUE SHIELD | Admitting: Psychiatry

## 2017-03-25 DIAGNOSIS — G4700 Insomnia, unspecified: Secondary | ICD-10-CM

## 2017-03-25 DIAGNOSIS — F411 Generalized anxiety disorder: Secondary | ICD-10-CM

## 2017-03-25 DIAGNOSIS — F4310 Post-traumatic stress disorder, unspecified: Secondary | ICD-10-CM

## 2017-03-25 DIAGNOSIS — Z813 Family history of other psychoactive substance abuse and dependence: Secondary | ICD-10-CM | POA: Diagnosis not present

## 2017-03-25 DIAGNOSIS — F1010 Alcohol abuse, uncomplicated: Secondary | ICD-10-CM

## 2017-03-25 DIAGNOSIS — F17210 Nicotine dependence, cigarettes, uncomplicated: Secondary | ICD-10-CM | POA: Diagnosis not present

## 2017-03-25 DIAGNOSIS — Z811 Family history of alcohol abuse and dependence: Secondary | ICD-10-CM | POA: Diagnosis not present

## 2017-03-25 NOTE — Progress Notes (Signed)
[name]/PA/NP OP Progress Note  03/25/2017 11:13 AM Jill [name]  MRN:  [id number]  Chief Complaint: med management  HPI: Jill [name] reports that she is taking Xanax 2 mg standard release at night, and she has become used to this. I spent time with her discussing the need for individual therapy, and recommending that she schedule with a therapist, provided referral, to learn coping strategies. Discussed that Xanax is a temporary solution for her anxiety but is not something that I wish to see her on long-term. She is sleeping well with Seroquel at night, and reports that her mood is fairly stable. Eyes any acute safety issues.  She reports that she is trying to get a different job, but her charges in the past continue to weigh on her in terms of job search. She was charged with fraud because she was collecting unemployment checks when she was not unemployed. She reports that it was true and she did return the money. She feels like this should be dismissed now because it's been so long. She displays poor insight into personal responsibility.  I reviewed the database below, and I asked the patient why she picked up the Xanax extended release 2 mg tablet and the 1 mg tablet if she is only taking the 2 mg tablet. She reports that the pharmacy. It for her so she just picked up.    Per [facility]: 02/27/2017 ALPRAZOLAM ER 1 MG TABLET 30.00 30 [id number] 01/28/2017 [id number] R [id number] 00.0 02/27/2017 ALPRAZOLAM ER 2 MG TABLET 30.00 30 [id number] 01/22/2017 [id number] R [id number] 00.0 01/30/2017 ALPRAZOLAM ER 1 MG TABLET 30.00 30 [id number] 01/28/2017 342750 N [id number] 00.0 01/22/2017 ALPRAZOLAM ER 2 MG TABLET 30.00 30 [id number] 01/22/2017 [id number] N [id number] 00.0 12/25/2016 ALPRAZOLAM 2 MG TABLET 30.00 30 [id number] 11/28/2016 [id number] N [id number] 00.0 11/28/2016 ALPRAZOLAM 2 MG TABLET 30.00 30 [id number] 11/28/2016 332941 N [id number] 00.0 11/18/2016  ALPRAZOLAM 2 MG TABLET 30.00 15 [id number] 11/04/2016 328531 R [id number] 00.0 11/04/2016 ALPRAZOLAM 2 MG TABLET 30.00 15 [id number] 11/04/2016 328531 N [id number] UNK 10/25/2016 ALPRAZOLAM 1 MG TABLET 60.00 15 [id number] 10/17/2016 [id number] N [id number] UNK 09/24/2016 ALPRAZOLAM 1 MG TABLET 120.00 30 [id number] 09/19/2016 321524 N [id number] UNK 09/10/2016 ALPRAZOLAM 1 MG TABLET 60.00 30 [id number] 09/10/2016 158727 N [id number] UNK *N/R N=New R=Refill +MED Daily Prescribers for prescriptions listed ---------------------------------------------------------------------------------------------------------------------------------- [id number] Jill [name], M.D.; [facility], [address], [place] [address] [id number] ROSS, Jill [name]; [address], [place] [address] Pharmacies that dispensed prescriptions listed ---------------------------------------------------------------------------------------------------------------------------------- [id number]  [facility] DBA: [phone], [address], [place] [address], [id number] [facility] [phone], [address], [place] [address], Patients that match search criteria ---------------------------------------------------------------------------------------------------------------------------------- [id number] Jill [name], DOB [birth year]; [address], [place] [address] [id number] Jill [name], DOB [birth year]; [address], [place] [address] [id number] Jill [name], DOB [birth year]; [address], [place] [address] [id number] Jill [name], DOB [birth year]; [address] [place] [address] [id number] Jill [name], DOB [birth year]; [address], [place] [address] [id number] Jill [name], DOB [birth year]; [address], [place] [address] [id number] Jill [name], DOB  [birth year]; [address], [place] [address] [id number] Jill [name], DOB [birth year]; [address], [place]  [address]   Visit Diagnosis:    ICD-10-CM   1. PTSD (post-traumatic stress disorder) F43.10 Ambulatory referral to  Psychology  2. GAD (generalized anxiety disorder) F41.1 Ambulatory referral to Psychology  3. Alcohol abuse F10.10 Ambulatory referral to Psychology    Past Psychiatric History: See intake H&P for full details. Reviewed, with no updates at this time.   Past Medical History:  Past Medical History:  Diagnosis Date  . Anxiety   . Bipolar 1 disorder (HCC)   . Hypertension   . Post-traumatic stress syndrome     Past Surgical History:  Procedure Laterality Date  . BLADDER SURGERY    . CHOLECYSTECTOMY    . TUBAL LIGATION      Family Psychiatric History: See intake H&P for full details. Reviewed, with no updates at this time.   Family History:  Family History  Problem Relation Age of Onset  . Drug abuse Mother   . Alcohol abuse Maternal Uncle   . Drug abuse Maternal Uncle     Social History:  Social History   Social History  . Marital status: Single    Spouse name: N/A  . Number of children: 2  . Years of education: 49   Social History Main Topics  . Smoking status: Current Every Day Smoker    Packs/day: 0.50    Types: Cigarettes    Last attempt to quit: 08/29/2016  . Smokeless tobacco: Never Used     Comment: patient is vaping and has cut back to 5 a week.   . Alcohol use No  . Drug use: No  . Sexual activity: No   Other Topics Concern  . Not on file   Social History Narrative   Pt lives with her son in [place]. 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 Born and raised in [place] by mom until 7 then by grandparents. Pt has one older brother. 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     Allergies: No Known Allergies  Metabolic Disorder Labs: No results found for: HGBA1C, MPG No results found  for: PROLACTIN No results found for: CHOL, TRIG, HDL, CHOLHDL, VLDL, LDLCALC No results found for: TSH  Therapeutic Level Labs: No results found for: LITHIUM No results found for: VALPROATE No components found for:  CBMZ  Current Medications: Current Outpatient Prescriptions  Medication Sig Dispense Refill  . ALPRAZolam (ALPRAZOLAM XR) 2 MG 24 hr tablet Take 1 tablet (2 mg total) by mouth every morning. 30 tablet 0  . amoxicillin (AMOXIL) 500 MG capsule Take 1 capsule (500 mg total) by mouth 3 (three) times daily. 30 capsule 0  . cholecalciferol (VITAMIN D) 400 UNITS TABS tablet Take 1,200 Units by mouth 2 (two) times a week. Reported on 11/09/2015    . hydrochlorothiazide (HYDRODIURIL) 25 MG tablet Take 25 mg by mouth daily. Reported on 11/09/2015    . ibuprofen (ADVIL,MOTRIN) 200 MG tablet Take 400 mg by mouth every 6 (six) hours as needed for headache.     . metFORMIN (GLUCOPHAGE) 500 MG tablet Take 1 tablet (500 mg total) by mouth daily with breakfast. 30 tablet 2  . metoprolol (LOPRESSOR) 50 MG tablet Take 50 mg by mouth 2 (two) times daily.    . naproxen (NAPROSYN) 375 MG tablet Take 1 tablet (375 mg total) by mouth 2 (two) times daily. 20 tablet 0  . NICOTINE STEP 3 7 MG/24HR patch PLACE 1 PATCH (7 MG TOTAL) ONTO THE SKIN DAILY. 28 patch 0  . QUEtiapine (SEROQUEL XR) 400 MG 24 hr tablet Take 1 tablet (400 mg  total) by mouth at bedtime. 90 tablet 1   No current facility-administered medications for this visit.      Musculoskeletal: Strength & Muscle Tone: within normal limits Gait & Station: normal Patient leans: N/A  Psychiatric Specialty Exam: ROS  Last menstrual period 10/12/2011.There is no height or weight on file to calculate BMI.  General Appearance: Casual and Fairly Groomed  Eye Contact:  Good  Speech:  Clear and Coherent  Volume:  Normal  Mood:  Euthymic  Affect:  Appropriate and Congruent  Thought Process:  Goal Directed  Orientation:  Full (Time, Place, and  Person)  Thought Content: Logical   Suicidal Thoughts:  No  Homicidal Thoughts:  No  Memory:  Immediate;   Good  Judgement:  Fair  Insight:  Shallow  Psychomotor Activity:  Normal  Concentration:  Concentration: Good  Recall:  Good  Fund of Knowledge: Good  Language: Good  Akathisia:  Negative  Handed:  Right  AIMS (if indicated): not done  Assets:  Communication Skills Desire for Improvement Financial Resources/Insurance Housing Social Support Talents/Skills Vocational/Educational  ADL's:  Intact  Cognition: WNL  Sleep:  Good   Screenings:   Assessment and Plan: Jill [name] is a 51 year old female with a history of alcohol use disorder, insomnia, PTSD, depression. She presents today for med management follow-up. I've gradually switched her from Xanax immediate release to the extended release, and discussed with her today that we will be gradually tapering the dose of Xanax extended release, and she should be engaging in individual therapy to learn coping strategies. She was agreeable to the recommendations and will follow up in 3 months.  1. PTSD (post-traumatic stress disorder)   2. GAD (generalized anxiety disorder)   3. Alcohol abuse     Patient's next refill of Xanax 2 mg ER should be on 9/17 or later Continue Seroquel 400 mg ER nightly Return to clinic in 3 months  referral for individual therapy  Burnard Leigh, [name] 03/25/2017, 11:13 AM

## 2017-04-11 DIAGNOSIS — Z23 Encounter for immunization: Secondary | ICD-10-CM | POA: Diagnosis not present

## 2017-04-11 DIAGNOSIS — M546 Pain in thoracic spine: Secondary | ICD-10-CM | POA: Diagnosis not present

## 2017-04-11 DIAGNOSIS — J309 Allergic rhinitis, unspecified: Secondary | ICD-10-CM | POA: Diagnosis not present

## 2017-04-11 DIAGNOSIS — I10 Essential (primary) hypertension: Secondary | ICD-10-CM | POA: Diagnosis not present

## 2017-04-18 ENCOUNTER — Ambulatory Visit (INDEPENDENT_AMBULATORY_CARE_PROVIDER_SITE_OTHER): Payer: BLUE CROSS/BLUE SHIELD | Admitting: Licensed Clinical Social Worker

## 2017-04-18 DIAGNOSIS — F4310 Post-traumatic stress disorder, unspecified: Secondary | ICD-10-CM

## 2017-04-21 ENCOUNTER — Telehealth (HOSPITAL_COMMUNITY): Payer: Self-pay

## 2017-04-21 DIAGNOSIS — F411 Generalized anxiety disorder: Secondary | ICD-10-CM

## 2017-04-21 MED ORDER — ALPRAZOLAM ER 2 MG PO TB24: 2.0000 mg | ORAL_TABLET | ORAL | 0 refills | Status: DC

## 2017-04-21 NOTE — Telephone Encounter (Signed)
Medication refill request - Telephone call with pt. to follow-up on her requested refill of Xanax extended release due to be reordered 04/14/17 or after per Dr. Unice Bailey last note 03/25/17 and informed she should still have a refill left [facility] [address]  Informed Dr. Rene Kocher was out until Wednesday 04/23/17 but would request refill upon his return for her Xanax.  Collateral to call back then if has not heard back with refill approval.

## 2017-04-21 NOTE — Telephone Encounter (Signed)
Met with Dr. Lovena Le, filling in for Dr. Daron Offer out today as he approved a one time refill of patient's prescribed Alprazolam XR, 2 mg, #30 with no refills.  [facility] on [address] with Ailene Ravel, pharmacist.  Called patient to inform new order verbally approved by Dr. Lovena Le in Dr. Joycelyn Schmid absence today was called [facility].

## 2017-04-26 ENCOUNTER — Other Ambulatory Visit (HOSPITAL_COMMUNITY): Payer: Self-pay | Admitting: Psychiatry

## 2017-05-02 DIAGNOSIS — M25569 Pain in unspecified knee: Secondary | ICD-10-CM | POA: Diagnosis not present

## 2017-05-05 ENCOUNTER — Ambulatory Visit: Payer: BLUE CROSS/BLUE SHIELD | Admitting: Licensed Clinical Social Worker

## 2017-05-21 ENCOUNTER — Telehealth (HOSPITAL_COMMUNITY): Payer: Self-pay

## 2017-05-21 DIAGNOSIS — F411 Generalized anxiety disorder: Secondary | ICD-10-CM

## 2017-05-21 NOTE — Telephone Encounter (Signed)
Patient called asking for a refill on Alprazolam 2 mg. Her next appointment is on 06/25/17. Please review and advise.

## 2017-05-22 MED ORDER — ALPRAZOLAM ER 2 MG PO TB24: 2.0000 mg | ORAL_TABLET | ORAL | 0 refills | Status: DC

## 2017-05-22 NOTE — Telephone Encounter (Signed)
Yes she is due, [facility] reviewed

## 2017-05-22 NOTE — Telephone Encounter (Signed)
Refill on medication has been sent to pharmacy and patient was notified.

## 2017-05-28 DIAGNOSIS — N62 Hypertrophy of breast: Secondary | ICD-10-CM | POA: Diagnosis not present

## 2017-05-28 DIAGNOSIS — M546 Pain in thoracic spine: Secondary | ICD-10-CM | POA: Diagnosis not present

## 2017-05-28 DIAGNOSIS — M542 Cervicalgia: Secondary | ICD-10-CM | POA: Diagnosis not present

## 2017-06-04 DIAGNOSIS — M542 Cervicalgia: Secondary | ICD-10-CM | POA: Diagnosis not present

## 2017-06-04 DIAGNOSIS — M546 Pain in thoracic spine: Secondary | ICD-10-CM | POA: Diagnosis not present

## 2017-06-17 DIAGNOSIS — M542 Cervicalgia: Secondary | ICD-10-CM | POA: Diagnosis not present

## 2017-06-17 DIAGNOSIS — M546 Pain in thoracic spine: Secondary | ICD-10-CM | POA: Diagnosis not present

## 2017-06-24 DIAGNOSIS — M542 Cervicalgia: Secondary | ICD-10-CM | POA: Diagnosis not present

## 2017-06-24 DIAGNOSIS — M546 Pain in thoracic spine: Secondary | ICD-10-CM | POA: Diagnosis not present

## 2017-06-25 ENCOUNTER — Encounter (HOSPITAL_COMMUNITY): Payer: Self-pay | Admitting: Psychiatry

## 2017-06-25 ENCOUNTER — Ambulatory Visit (INDEPENDENT_AMBULATORY_CARE_PROVIDER_SITE_OTHER): Payer: BLUE CROSS/BLUE SHIELD | Admitting: Psychiatry

## 2017-06-25 DIAGNOSIS — F319 Bipolar disorder, unspecified: Secondary | ICD-10-CM | POA: Diagnosis not present

## 2017-06-25 DIAGNOSIS — Z79899 Other long term (current) drug therapy: Secondary | ICD-10-CM

## 2017-06-25 DIAGNOSIS — F411 Generalized anxiety disorder: Secondary | ICD-10-CM

## 2017-06-25 DIAGNOSIS — F17210 Nicotine dependence, cigarettes, uncomplicated: Secondary | ICD-10-CM | POA: Diagnosis not present

## 2017-06-25 DIAGNOSIS — F17290 Nicotine dependence, other tobacco product, uncomplicated: Secondary | ICD-10-CM | POA: Diagnosis not present

## 2017-06-25 DIAGNOSIS — Z813 Family history of other psychoactive substance abuse and dependence: Secondary | ICD-10-CM | POA: Diagnosis not present

## 2017-06-25 DIAGNOSIS — Z811 Family history of alcohol abuse and dependence: Secondary | ICD-10-CM | POA: Diagnosis not present

## 2017-06-25 MED ORDER — ALPRAZOLAM ER 2 MG PO TB24: 2.0000 mg | ORAL_TABLET | ORAL | 2 refills | Status: DC

## 2017-06-25 MED ORDER — QUETIAPINE FUMARATE 400 MG PO TABS: 400.0000 mg | ORAL_TABLET | Freq: Every day | ORAL | 1 refills | Status: DC

## 2017-06-25 NOTE — Progress Notes (Signed)
BH MD/PA/NP OP Progress Note  06/25/2017 11:01 AM Jill [name]  MRN:  [id number]  Chief Complaint: med management  HPI: Jill [name] reports that her mood and anxiety levels are managing well on the current regimen.  She is sleeping consistently every night.  She reports some external stressors related to her job but is able to reflect on multiple positive aspects of her social and family life, and her steps that she is taking to address her health.  She denies any significant side effects or intolerance to Seroquel immediate release and feels that this helps with her sleep.  Agreed to continue on alprazolam extended release and Seroquel immediate release nightly.  We will follow-up in 3 months or sooner if needed.  She has had some financial stressors, we discussed some troubleshooting strategies including consideration of uver and lyft driver, and doing dog walking service.  She was very receptive to these and appreciated these suggestions.  Visit Diagnosis:    ICD-10-CM   1. Bipolar I disorder (HCC) F31.9 QUEtiapine (SEROQUEL) 400 MG tablet  2. Generalized anxiety disorder F41.1 ALPRAZolam (ALPRAZOLAM XR) 2 MG 24 hr tablet    Past Psychiatric History: See intake H&P for full details. Reviewed, with no updates at this time.   Past Medical History:  Past Medical History:  Diagnosis Date  . Anxiety   . Bipolar 1 disorder (HCC)   . Hypertension   . Post-traumatic stress syndrome     Past Surgical History:  Procedure Laterality Date  . BLADDER SURGERY    . CHOLECYSTECTOMY    . TUBAL LIGATION      Family Psychiatric History: See intake H&P for full details. Reviewed, with no updates at this time.   Family History:  Family History  Problem Relation Age of Onset  . Drug abuse Mother   . Alcohol abuse Maternal Uncle   . Drug abuse Maternal Uncle     Social History:  Social History   Socioeconomic History  . Marital status: Single    Spouse name: Not on file  .  Number of children: 2  . Years of education: 9114  . Highest education level: Not on file  Social Needs  . Financial resource strain: Not on file  . Food insecurity - worry: Not on file  . Food insecurity - inability: Not on file  . Transportation needs - medical: Not on file  . Transportation needs - non-medical: Not on file  Occupational History  . Not on file  Tobacco Use  . Smoking status: Current Every Day Smoker    Packs/day: 0.50    Types: Cigarettes    Last attempt to quit: 08/29/2016    Years since quitting: 0.8  . Smokeless tobacco: Never Used  . Tobacco comment: patient is vaping and has cut back to 5 a week.   Substance and Sexual Activity  . Alcohol use: No  . Drug use: No  . Sexual activity: No    Birth control/protection: Surgical  Other Topics Concern  . Not on file  Social History Narrative   [place] 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 Born and raised in [place] by mom until 7 then by grandparents. Pt has one older brother. 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 Never married and has 2 kids.     Allergies: No Known Allergies  Metabolic Disorder Labs: No results found for: HGBA1C, MPG No results found for: PROLACTIN  No results found for: CHOL, TRIG, HDL, CHOLHDL, VLDL, LDLCALC No results found for: TSH  Therapeutic Level Labs: No results found for: LITHIUM No results found for: VALPROATE No components found for:  CBMZ  Current Medications: Current Outpatient Medications  Medication Sig Dispense Refill  . ALPRAZolam (ALPRAZOLAM XR) 2 MG 24 hr tablet Take 1 tablet (2 mg total) by mouth every morning. 30 tablet 2  . amoxicillin (AMOXIL) 500 MG capsule Take 1 capsule (500 mg total) by mouth 3 (three) times daily. 30 capsule 0  . cholecalciferol (VITAMIN D) 400 UNITS TABS tablet Take 1,200 Units by mouth 2 (two) times a week. Reported on 11/09/2015    . hydrochlorothiazide (HYDRODIURIL) 25 MG tablet Take 25 mg by  mouth daily. Reported on 11/09/2015    . ibuprofen (ADVIL,MOTRIN) 200 MG tablet Take 400 mg by mouth every 6 (six) hours as needed for headache.     . metFORMIN (GLUCOPHAGE) 500 MG tablet Take 1 tablet (500 mg total) by mouth daily with breakfast. 30 tablet 2  . metoprolol (LOPRESSOR) 50 MG tablet Take 50 mg by mouth 2 (two) times daily.    . naproxen (NAPROSYN) 375 MG tablet Take 1 tablet (375 mg total) by mouth 2 (two) times daily. 20 tablet 0  . NICOTINE STEP 3 7 MG/24HR patch PLACE 1 PATCH (7 MG TOTAL) ONTO THE SKIN DAILY. 28 patch 0  . QUEtiapine (SEROQUEL) 400 MG tablet Take 1 tablet (400 mg total) by mouth at bedtime. 90 tablet 1   No current facility-administered medications for this visit.      Musculoskeletal: Strength & Muscle Tone: within normal limits Gait & Station: normal Patient leans: N/A  Psychiatric Specialty Exam: ROS  Last menstrual period 10/12/2011.There is no height or weight on file to calculate BMI.  General Appearance: Casual and Fairly Groomed  Eye Contact:  Fair  Speech:  Clear and Coherent  Volume:  Normal  Mood:  Euthymic  Affect:  Appropriate and Congruent  Thought Process:  Goal Directed and Descriptions of Associations: Intact  Orientation:  Full (Time, Place, and Person)  Thought Content: Logical   Suicidal Thoughts:  No  Homicidal Thoughts:  No  Memory:  Immediate;   Fair  Judgement:  Good  Insight:  Fair  Psychomotor Activity:  Normal  Concentration:  Attention Span: Fair  Recall:  [employer]Fair  Fund of Knowledge: Fair  Language: Good  Akathisia:  Negative  Handed:  Right  AIMS (if indicated): not done  Assets:  Communication Skills Desire for Improvement Financial Resources/Insurance Housing  ADL's:  Intact  Cognition: WNL  Sleep:  Good   Screenings:    Assessment and Plan: Jill [name] is a 51 year old female with presumed bipolar 1 disorder, generalized anxiety, and trauma related anxiety.  Presents with mood and anxiety stability  on the current regimen.  [place] substance [place] reviewed, no concerns for inappropriate substance use.  We will proceed as below and follow-up in 3 months.  1. Bipolar I disorder (HCC)   2. Generalized anxiety disorder     Status of current problems: stable  Labs Ordered: No orders of the defined types were placed in this encounter.   Labs Reviewed: n/a  Collateral Obtained/Records Reviewed: n/a  Plan:  Continue Xanax 2 mg ER every morning Continue Seroquel 400 mg nightly RTC 3 months  I spent 20 minutes with the patient in direct face-to-face clinical care.  Greater than 50% of this time was spent in counseling and coordination  of care with the patient.    Burnard Leigh, MD 06/25/2017, 11:01 AM

## 2017-06-25 NOTE — Patient Instructions (Signed)
Look at "happy light" by verilux online or on amazon to see if this helps with your energy and mood during the day

## 2017-06-26 DIAGNOSIS — M546 Pain in thoracic spine: Secondary | ICD-10-CM | POA: Diagnosis not present

## 2017-06-26 DIAGNOSIS — M542 Cervicalgia: Secondary | ICD-10-CM | POA: Diagnosis not present

## 2017-07-01 DIAGNOSIS — M542 Cervicalgia: Secondary | ICD-10-CM | POA: Diagnosis not present

## 2017-07-01 DIAGNOSIS — M546 Pain in thoracic spine: Secondary | ICD-10-CM | POA: Diagnosis not present

## 2017-07-03 DIAGNOSIS — M542 Cervicalgia: Secondary | ICD-10-CM | POA: Diagnosis not present

## 2017-07-03 DIAGNOSIS — M546 Pain in thoracic spine: Secondary | ICD-10-CM | POA: Diagnosis not present

## 2017-07-09 DIAGNOSIS — N62 Hypertrophy of breast: Secondary | ICD-10-CM | POA: Diagnosis not present

## 2017-07-09 DIAGNOSIS — M542 Cervicalgia: Secondary | ICD-10-CM | POA: Diagnosis not present

## 2017-07-09 DIAGNOSIS — M546 Pain in thoracic spine: Secondary | ICD-10-CM | POA: Diagnosis not present

## 2017-07-10 DIAGNOSIS — M546 Pain in thoracic spine: Secondary | ICD-10-CM | POA: Diagnosis not present

## 2017-07-10 DIAGNOSIS — M542 Cervicalgia: Secondary | ICD-10-CM | POA: Diagnosis not present

## 2017-07-20 ENCOUNTER — Encounter (HOSPITAL_COMMUNITY): Payer: Self-pay | Admitting: Emergency Medicine

## 2017-07-20 ENCOUNTER — Emergency Department (HOSPITAL_COMMUNITY)
Admission: EM | Admit: 2017-07-20 | Discharge: 2017-07-20 | Disposition: A | Discharge status: Home / Self Care | Payer: BLUE CROSS/BLUE SHIELD | Attending: Emergency Medicine | Admitting: Emergency Medicine

## 2017-07-20 ENCOUNTER — Other Ambulatory Visit: Payer: Self-pay

## 2017-07-20 DIAGNOSIS — F4312 Post-traumatic stress disorder, chronic: Secondary | ICD-10-CM | POA: Diagnosis not present

## 2017-07-20 DIAGNOSIS — K0889 Other specified disorders of teeth and supporting structures: Secondary | ICD-10-CM | POA: Diagnosis not present

## 2017-07-20 DIAGNOSIS — Z79899 Other long term (current) drug therapy: Secondary | ICD-10-CM | POA: Insufficient documentation

## 2017-07-20 DIAGNOSIS — K047 Periapical abscess without sinus: Secondary | ICD-10-CM | POA: Diagnosis not present

## 2017-07-20 DIAGNOSIS — I10 Essential (primary) hypertension: Secondary | ICD-10-CM | POA: Diagnosis not present

## 2017-07-20 DIAGNOSIS — F319 Bipolar disorder, unspecified: Secondary | ICD-10-CM | POA: Insufficient documentation

## 2017-07-20 DIAGNOSIS — K029 Dental caries, unspecified: Secondary | ICD-10-CM

## 2017-07-20 MED ORDER — AMOXICILLIN 500 MG PO CAPS: 500.0000 mg | ORAL_CAPSULE | Freq: Three times a day (TID) | ORAL | 0 refills | Status: DC

## 2017-07-20 MED ORDER — OXYCODONE-ACETAMINOPHEN 5-325 MG PO TABS
1.0000 | ORAL_TABLET | Freq: Once | ORAL | Status: AC
  Administered 2017-07-20: 1 via ORAL
  Filled 2017-07-20: qty 1

## 2017-07-20 MED ORDER — AMOXICILLIN 500 MG PO CAPS
500.0000 mg | ORAL_CAPSULE | Freq: Once | ORAL | Status: AC
  Administered 2017-07-20: 500 mg via ORAL
  Filled 2017-07-20: qty 1

## 2017-07-20 NOTE — ED Provider Notes (Signed)
[facility]-EMERGENCY DEPT Provider Note   CSN: [id number] Arrival date & time: 07/20/17  2007     History   Chief Complaint Chief Complaint  Patient presents with  . Dental Pain    HPI Jill [name] is a 51 y.o. female who presents to the ED with dental pain. The pain started 3 days ago and has gotten worse. Patient reports taking ibuprofen 800 mg without relief. The pain is located in the right lower dental area.   The history is provided by the patient.  Dental Pain   This is a new problem. The current episode started more than 2 days ago. The problem occurs constantly. The problem has been gradually worsening. The pain is at a severity of 9/10.    Past Medical History:  Diagnosis Date  . Anxiety   . Bipolar 1 disorder (HCC)   . Hypertension   . Post-traumatic stress syndrome     Patient Active Problem List   Diagnosis Date Noted  . Alcohol abuse 11/09/2015  . Insomnia 11/09/2015  . PTSD (post-traumatic stress disorder) 11/09/2015  . GAD (generalized anxiety disorder) 11/09/2015  . 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 BLADDER SURGERY    . CHOLECYSTECTOMY    . TUBAL LIGATION      OB History    No data available       Home Medications    Prior to Admission medications   Medication Sig Start Date End Date Taking? Authorizing Provider  ALPRAZolam (ALPRAZOLAM XR) 2 MG 24 hr tablet Take 1 tablet (2 mg total) by mouth every morning. 06/25/17   Eksir, Bo McclintockAlexander Arya, MD  amoxicillin (AMOXIL) 500 MG capsule Take 1 capsule (500 mg total) by mouth 3 (three) times daily. 07/20/17   Janne NapoleonNeese, Hope M, NP  cholecalciferol (VITAMIN D) 400 UNITS TABS tablet Take 1,200 Units by mouth 2 (two) times a week. Reported on 11/09/2015    [provider]  hydrochlorothiazide (HYDRODIURIL) 25 MG tablet Take 25 mg by mouth daily. Reported  on 11/09/2015    [provider]  ibuprofen (ADVIL,MOTRIN) 200 MG tablet Take 400 mg by mouth every 6 (six) hours as needed for headache.     [provider]  metFORMIN (GLUCOPHAGE) 500 MG tablet Take 1 tablet (500 mg total) by mouth daily with breakfast. 01/22/17 01/22/18  Burnard LeighEksir, Alexander Arya, MD  metoprolol (LOPRESSOR) 50 MG tablet Take 50 mg by mouth 2 (two) times daily.    [provider]  naproxen (NAPROSYN) 375 MG tablet Take 1 tablet (375 mg total) by mouth 2 (two) times daily. 03/10/17   Janne NapoleonNeese, Hope M, NP  NICOTINE STEP 3 7 MG/24HR patch PLACE 1 PATCH (7 MG TOTAL) ONTO THE SKIN DAILY. 02/17/17   Burnard LeighEksir, Alexander Arya, MD  QUEtiapine (SEROQUEL) 400 MG tablet Take 1 tablet (400 mg total) by mouth at bedtime. 06/25/17   Eksir, Bo McclintockAlexander Arya, MD    Family History Family History  Problem Relation Age of Onset  . Drug abuse Mother   . Alcohol abuse Maternal Uncle   . Drug abuse Maternal Uncle     Social History Social History   Tobacco Use  . Smoking status: Current Every Day Smoker    Packs/day: 0.50    Types: Cigarettes    Last attempt to quit: 08/29/2016    Years since quitting: 0.8  . Smokeless tobacco: Never Used  .  Tobacco comment: patient is vaping and has cut back to 5 a week.   Substance Use Topics  . Alcohol use: No  . Drug use: No     Allergies   Patient has no known allergies.   Review of Systems Review of Systems  HENT: Positive for dental problem.   Hematological: Positive for adenopathy.  All other systems reviewed and are negative.    Physical Exam Updated Vital Signs BP (!) 151/97 (BP Location: Right Arm)   Pulse 83   Temp 98.3 F (36.8 C) (Oral)   Resp 16   Ht 5\' 7"  (1.702 m)   Wt 107.5 kg (237 lb)   LMP 10/12/2011   SpO2 94%   BMI 37.12 kg/m   Physical Exam  Constitutional: She is oriented to person, place, and time. She appears well-developed and well-nourished. No distress.  HENT:  Head: Normocephalic.    Right Ear: Tympanic membrane normal.  Left Ear: Tympanic membrane normal.  Nose: Nose normal.  Mouth/Throat: Oropharynx is clear and moist. No trismus in the jaw. Dental caries present.    Eyes: Conjunctivae and EOM are normal. Pupils are equal, round, and reactive to light.  Neck: Normal range of motion. Neck supple.  Cardiovascular: Normal rate and regular rhythm.  Pulmonary/Chest: Effort normal and breath sounds normal.  Abdominal: Soft. There is no tenderness.  Musculoskeletal: Normal range of motion.  Lymphadenopathy:    She has cervical adenopathy.  Neurological: She is alert and oriented to person, place, and time. No cranial nerve deficit.  Skin: Skin is warm and dry.  Psychiatric: She has a normal mood and affect.  Nursing note and vitals reviewed.    ED Treatments / Results  Labs (all labs ordered are listed, but only abnormal results are displayed) Labs Reviewed - No data to display  Radiology No results found.  Procedures Procedures (including critical care time)  Medications Ordered in ED Medications  oxyCODONE-acetaminophen (PERCOCET/ROXICET) 5-325 MG per tablet 1 tablet (1 tablet Oral Given 07/20/17 2220)  amoxicillin (AMOXIL) capsule 500 mg (500 mg Oral Given 07/20/17 2220)     Initial Impression / Assessment and Plan / ED Course  I have reviewed the triage vital signs and the nursing notes. Patient with toothache.  No gross abscess.  Exam unconcerning for Ludwig's angina or spread of infection.  Will treat with Amoxicillin and anti-inflammatories medicine.  Urged patient to follow-up with dentist.  Dental resource given.  Final Clinical Impressions(s) / ED Diagnoses   Final diagnoses:  Infected dental caries  Toothache    ED Discharge Orders        Ordered    amoxicillin (AMOXIL) 500 MG capsule  3 times daily     07/20/17 2215       Kerrie Buffaloeese, Hope Wood[place]M, [place]NP 07/20/17 2230    Tegeler, Canary Brimhristopher J, MD 07/21/17 [phone]

## 2017-07-20 NOTE — ED Notes (Signed)
Pt ios alert and oriented x 4 and is verbally responsive. Pt is having Rt back/botton tooth pain. Pt has a molar that appears cracked and decayed pt has rt jaw swelling . Pt reports 9/10 throbbing pain. Pt reports that she took 800 mg of motrin but has not been effective the past day.

## 2017-07-20 NOTE — ED Notes (Signed)
Pt given heat pad for pain

## 2017-07-20 NOTE — ED Triage Notes (Signed)
Pt from home with c/o bottom right wisdom tooth pain. Pt states she does not have dental insurance and does not have a dentist. Pt is neither febrile nor tachycardic

## 2017-08-25 ENCOUNTER — Emergency Department (HOSPITAL_COMMUNITY)
Admission: EM | Admit: 2017-08-25 | Discharge: 2017-08-25 | Disposition: A | Discharge status: Home / Self Care | Payer: BLUE CROSS/BLUE SHIELD | Attending: Emergency Medicine | Admitting: Emergency Medicine

## 2017-08-25 ENCOUNTER — Encounter (HOSPITAL_COMMUNITY): Payer: Self-pay | Admitting: Emergency Medicine

## 2017-08-25 DIAGNOSIS — I10 Essential (primary) hypertension: Secondary | ICD-10-CM | POA: Insufficient documentation

## 2017-08-25 DIAGNOSIS — Z7984 Long term (current) use of oral hypoglycemic drugs: Secondary | ICD-10-CM | POA: Diagnosis not present

## 2017-08-25 DIAGNOSIS — K0889 Other specified disorders of teeth and supporting structures: Secondary | ICD-10-CM | POA: Insufficient documentation

## 2017-08-25 DIAGNOSIS — Z79899 Other long term (current) drug therapy: Secondary | ICD-10-CM | POA: Diagnosis not present

## 2017-08-25 DIAGNOSIS — F17210 Nicotine dependence, cigarettes, uncomplicated: Secondary | ICD-10-CM | POA: Diagnosis not present

## 2017-08-25 MED ORDER — ACETAMINOPHEN 500 MG PO TABS: 1000.0000 mg | ORAL_TABLET | Freq: Four times a day (QID) | ORAL | 0 refills | Status: DC | PRN

## 2017-08-25 MED ORDER — AMOXICILLIN-POT CLAVULANATE 875-125 MG PO TABS: 1.0000 | ORAL_TABLET | Freq: Two times a day (BID) | ORAL | 0 refills | Status: AC

## 2017-08-25 MED ORDER — MAGIC MOUTHWASH
5.0000 mL | Freq: Once | ORAL | Status: AC
  Administered 2017-08-25: 5 mL via ORAL
  Filled 2017-08-25 (×2): qty 5

## 2017-08-25 MED ORDER — HYDROCODONE-ACETAMINOPHEN 5-325 MG PO TABS
1.0000 | ORAL_TABLET | Freq: Once | ORAL | Status: AC
  Administered 2017-08-25: 1 via ORAL
  Filled 2017-08-25: qty 1

## 2017-08-25 MED ORDER — MAGIC MOUTHWASH: 5.0000 mL | Freq: Three times a day (TID) | ORAL | 0 refills | Status: DC | PRN

## 2017-08-25 NOTE — ED Triage Notes (Signed)
Patient c/o right lower dental pain x4 days. Reports she does not have a Education officer, communitydentist.

## 2017-08-25 NOTE — ED Provider Notes (Signed)
[facility]-EM[facility]PT Provider Note   CSN: [id number] Arrival date & time: 08/25/17  1104     History   Chief Complaint Chief Complaint  Patient presents with  . Dental Pain    HPI Jill [name] is a 52 y.o. female with past medical history significant for anxiety, bipolar, hypertension presenting with 4 days of right lower molar pain.  Patient reports that it is her wisdom tooth that started bothering her on that side.  She has had similar pains in the past with her other wisdom teeth that have now been extracted.  She reports that the last tooth has never bothered her before but started hurting 4 days ago.  He has tried ibuprofen 800 which appears to have stopped working.  Denies any fever, chills, nausea, vomiting, facial swelling, difficulty swallowing, difficulty breathing or any other symptoms.  HPI  Past Medical History:  Diagnosis Date  . Anxiety   . Bipolar 1 disorder (HCC)   . Hypertension   . Post-traumatic stress syndrome     Patient Active Problem List   Diagnosis Date Noted  . Alcohol abuse 11/09/2015  . Insomnia 11/09/2015  . PTSD (post-traumatic stress disorder) 11/09/2015  . GAD (generalized anxiety disorder) 11/09/2015  . 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 BLADDER SURGERY    . CHOLECYSTECTOMY    . TUBAL LIGATION      OB History    No data available       Home Medications    Prior to Admission medications   Medication Sig Start Date End Date Taking? Authorizing Provider  ALPRAZolam (ALPRAZOLAM XR) 2 MG 24 hr tablet Take 1 tablet (2 mg total) by mouth every morning. 06/25/17  Yes Eksir, Bo Mcclintock, MD  cholecalciferol (VITAMIN D) 400 UNITS TABS tablet Take 1,200 Units by mouth 2 (two) times a week. Reported on 11/09/2015   Yes [provider]  hydrochlorothiazide (HYDRODIURIL) 25 MG tablet  Take 25 mg by mouth daily. Reported on 11/09/2015   Yes [provider]  ibuprofen (ADVIL,MOTRIN) 200 MG tablet Take 400 mg by mouth every 6 (six) hours as needed for headache.    Yes [provider]  metFORMIN (GLUCOPHAGE) 500 MG tablet Take 1 tablet (500 mg total) by mouth daily with breakfast. 01/22/17 01/22/18 Yes Eksir, Bo Mcclintock, MD  metoprolol (LOPRESSOR) 50 MG tablet Take 50 mg by mouth 2 (two) times daily.   Yes [provider]  NICOTINE STEP 3 7 MG/24HR patch PLACE 1 PATCH (7 MG TOTAL) ONTO THE SKIN DAILY. 02/17/17  Yes Eksir, Bo Mcclintock, MD  QUEtiapine (SEROQUEL) 400 MG tablet Take 1 tablet (400 mg total) by mouth at bedtime. 06/25/17  Yes Eksir, Bo Mcclintock, MD  acetaminophen (TYLENOL) 500 MG tablet Take 2 tablets (1,000 mg total) by mouth every 6 (six) hours as needed for moderate pain. 08/25/17   Mathews Robinsons B, PA-C  amoxicillin (AMOXIL) 500 MG capsule Take 1 capsule (500 mg total) by mouth 3 (three) times daily. Patient not taking: Reported on 08/25/2017 07/20/17   Janne Napoleon, NP  amoxicillin-clavulanate (AUGMENTIN) 875-125 MG tablet Take 1 tablet by mouth 2 (two) times daily for 7 days. 08/25/17 09/01/17  Mathews Robinsons B, PA-C  magic mouthwash SOLN Take 5 mLs by mouth 3 (three) times daily as needed for mouth pain. 08/25/17   Mathews Robinsons B, PA-C  naproxen (NAPROSYN) 375  MG tablet Take 1 tablet (375 mg total) by mouth 2 (two) times daily. Patient not taking: Reported on 08/25/2017 03/10/17   Janne Napoleon, NP    Family History Family History  Problem Relation Age of Onset  . Drug abuse Mother   . Alcohol abuse Maternal Uncle   . Drug abuse Maternal Uncle     Social History Social History   Tobacco Use  . Smoking status: Current Every Day Smoker    Packs/day: 0.50    Types: Cigarettes    Last attempt to quit: 08/29/2016    Years since quitting: 0.9  . Smokeless tobacco: Never Used  . Tobacco comment: patient is vaping and has  cut back to 5 a week.   Substance Use Topics  . Alcohol use: No  . Drug use: No     Allergies   Patient has no known allergies.   Review of Systems Review of Systems  Constitutional: Negative for chills, diaphoresis, fatigue and fever.  HENT: Positive for dental problem. Negative for facial swelling, sore throat, tinnitus, trouble swallowing and voice change.   Respiratory: Negative for cough, choking, chest tightness, shortness of breath, wheezing and stridor.   Cardiovascular: Negative for chest pain and palpitations.  Gastrointestinal: Negative for abdominal distention, abdominal pain, nausea and vomiting.  Musculoskeletal: Negative for myalgias, neck pain and neck stiffness.  Skin: Negative for color change, pallor, rash and wound.     Physical Exam Updated Vital Signs BP 130/83 (BP Location: Left Arm) Comment: Patient takes BP meds but did not take any this morning.  Pulse 90   Temp 98.1 F (36.7 C) (Oral)   Resp 16   LMP 10/12/2011   SpO2 99%   Physical Exam  Constitutional: She is oriented to person, place, and time. She appears well-developed and well-nourished. No distress.  Afebrile, nontoxic-appearing, sitting in chair in apparent discomfort.  HENT:  Head: Normocephalic and atraumatic.  Right Ear: External ear normal.  Left Ear: External ear normal.  Mouth/Throat: Uvula is midline, oropharynx is clear and moist and mucous membranes are normal. No oral lesions. No trismus in the jaw. Dental caries present. No dental abscesses or uvula swelling. No oropharyngeal exudate, posterior oropharyngeal edema, posterior oropharyngeal erythema or tonsillar abscesses. No tonsillar exudate.    No gross oral abscess. Uvula is midline, arches are simmetrical and intact. No peritonsilar swelling or exudate. No trismus. Sublingual mucosa is soft and non-tender. Tolerating oral secretions. No concern for ludwig's angina.  Eyes: Conjunctivae and EOM are normal. Right eye exhibits  no discharge. Left eye exhibits no discharge.  Neck: Normal range of motion. Neck supple.  Cardiovascular: Normal rate, regular rhythm and normal heart sounds.  No murmur heard. Pulmonary/Chest: Effort normal and breath sounds normal. No stridor. No respiratory distress. She has no wheezes. She has no rales.  Abdominal: She exhibits no distension.  Musculoskeletal: Normal range of motion. She exhibits no edema.  Lymphadenopathy:    She has cervical adenopathy.  Neurological: She is alert and oriented to person, place, and time.  Skin: Skin is warm and dry. No rash noted. She is not diaphoretic. No erythema. No pallor.  Psychiatric: She has a normal mood and affect.  Nursing note and vitals reviewed.    ED Treatments / Results  Labs (all labs ordered are listed, but only abnormal results are displayed) Labs Reviewed - No data to display  EKG  EKG Interpretation None       Radiology No results found.  Procedures  Procedures (including critical care time)  Medications Ordered in ED Medications  HYDROcodone-acetaminophen (NORCO/VICODIN) 5-325 MG per tablet 1 tablet (not administered)  magic mouthwash (not administered)     Initial Impression / Assessment and Plan / ED Course  I have reviewed the triage vital signs and the nursing notes.  Pertinent labs & imaging results that were available during my care of the patient were reviewed by me and considered in my medical decision making (see chart for details).    Patient with toothache.  No gross abscess.  Exam unconcerning for Ludwig's angina or spread of infection.  Will treat with penicillin and pain medicine.  Urged patient to follow-up with dentist.    Patient is otherwise well-appearing, nontoxic afebrile with normal vital signs and stable. Patient's pain was managed well in the emergency department.  Discussed strict return precautions and advised to return to the emergency department if experiencing any new or  worsening symptoms. Instructions were understood and patient agreed with discharge plan.  Final Clinical Impressions(s) / ED Diagnoses   Final diagnoses:  Pain, dental    ED Discharge Orders        Ordered    magic mouthwash SOLN  3 times daily PRN     08/25/17 1254    amoxicillin-clavulanate (AUGMENTIN) 875-125 MG tablet  2 times daily     08/25/17 1254    acetaminophen (TYLENOL) 500 MG tablet  Every 6 hours PRN     08/25/17 1254       Georgiana ShoreMitchell, Taliyah Watrous B, PA-C 08/25/17 1254    Alvira MondaySchlossman, Erin, MD 08/26/17 2232

## 2017-08-25 NOTE — Discharge Instructions (Signed)
As discussed, take your entire course of antibiotics even if you feel better.  Make sure that you see a dentist in the next week to discuss extraction.  Many of them will work out the payment plan to assist you with this.  Use the list provided as a resource.  Magic mouthwash, Tylenol 1000 every 6 hours for pain.  Do not exceed 4000 mg of acetaminophen in a 24-hour period.  Return if symptoms worsen, facial swelling, difficulty breathing, difficulty swallowing, fever, chills or other new concerning symptoms in the meantime.

## 2017-08-27 DIAGNOSIS — K047 Periapical abscess without sinus: Secondary | ICD-10-CM | POA: Diagnosis not present

## 2017-08-27 DIAGNOSIS — Z79899 Other long term (current) drug therapy: Secondary | ICD-10-CM | POA: Diagnosis not present

## 2017-09-05 ENCOUNTER — Other Ambulatory Visit: Payer: Self-pay

## 2017-09-05 ENCOUNTER — Emergency Department (HOSPITAL_COMMUNITY)
Admission: EM | Admit: 2017-09-05 | Discharge: 2017-09-05 | Disposition: A | Discharge status: Home / Self Care | Payer: BLUE CROSS/BLUE SHIELD | Attending: Emergency Medicine | Admitting: Emergency Medicine

## 2017-09-05 ENCOUNTER — Encounter (HOSPITAL_COMMUNITY): Payer: Self-pay

## 2017-09-05 DIAGNOSIS — H1031 Unspecified acute conjunctivitis, right eye: Secondary | ICD-10-CM | POA: Insufficient documentation

## 2017-09-05 DIAGNOSIS — I10 Essential (primary) hypertension: Secondary | ICD-10-CM | POA: Diagnosis not present

## 2017-09-05 DIAGNOSIS — F17210 Nicotine dependence, cigarettes, uncomplicated: Secondary | ICD-10-CM | POA: Insufficient documentation

## 2017-09-05 DIAGNOSIS — Z79899 Other long term (current) drug therapy: Secondary | ICD-10-CM | POA: Diagnosis not present

## 2017-09-05 DIAGNOSIS — H5789 Other specified disorders of eye and adnexa: Secondary | ICD-10-CM | POA: Diagnosis not present

## 2017-09-05 MED ORDER — FLUORESCEIN SODIUM 1 MG OP STRP
1.0000 | ORAL_STRIP | Freq: Once | OPHTHALMIC | Status: AC
  Administered 2017-09-05: 1 via OPHTHALMIC
  Filled 2017-09-05: qty 1

## 2017-09-05 MED ORDER — TETRACAINE HCL 0.5 % OP SOLN
1.0000 [drp] | Freq: Once | OPHTHALMIC | Status: AC
  Administered 2017-09-05: 1 [drp] via OPHTHALMIC
  Filled 2017-09-05: qty 4

## 2017-09-05 MED ORDER — ERYTHROMYCIN 5 MG/GM OP OINT: TOPICAL_OINTMENT | OPHTHALMIC | 0 refills | Status: DC

## 2017-09-05 NOTE — ED Provider Notes (Signed)
[name] Provider Note   CSN: [id number] Arrival date & time: 09/05/17  1043     History   Chief Complaint Chief Complaint  Patient presents with  . Eye Problem    HPI [name] is a 52 y.o. female.  HPI  Patient is a 52 year old female with a history of anxiety, bipolar 1 disorder, and hypertension presenting for erythema, irritation, and drainage to her right eye for 12 hours.  Patient reports that she was exposed to conjunctivitis and her nieces and nephews 2 days ago.  Patient reports that she woke up with a foreign body sensation in her right and has been rubbing it copiously.  Subsequently, patient reports that her right eye feels swollen.  Patient has had small amount of purulent discharge, but mostly watery drainage from the right eye.  No fever or chills.  No blurred or double vision.  Patient denies any retro-orbital pain or photophobia.  Patient denies any immunosuppressive medications or conditions, or history autoimmune disease.  Past Medical History:  Diagnosis Date  . Anxiety   . Bipolar 1 disorder (HCC)   . Hypertension   . Post-traumatic stress syndrome     Patient Active Problem List   Diagnosis Date Noted  . Alcohol abuse 11/09/2015  . Insomnia 11/09/2015  . PTSD (post-traumatic stress disorder) 11/09/2015  . GAD (generalized anxiety disorder) 11/09/2015  . 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 BLADDER SURGERY    . CHOLECYSTECTOMY    . TUBAL LIGATION      OB History    No data available       Home Medications    Prior to Admission medications   Medication Sig Start Date End Date Taking? Authorizing Provider  acetaminophen (TYLENOL) 500 MG tablet Take 2 tablets (1,000 mg total) by mouth every 6 (six) hours as needed for moderate pain. 08/25/17   Georgiana Shore, PA-C  ALPRAZolam  (ALPRAZOLAM XR) 2 MG 24 hr tablet Take 1 tablet (2 mg total) by mouth every morning. 06/25/17   Eksir, Bo Mcclintock, MD  amoxicillin (AMOXIL) 500 MG capsule Take 1 capsule (500 mg total) by mouth 3 (three) times daily. Patient not taking: Reported on 08/25/2017 07/20/17   Janne Napoleon, NP  cholecalciferol (VITAMIN D) 400 UNITS TABS tablet Take 1,200 Units by mouth 2 (two) times a week. Reported on 11/09/2015    [provider]  erythromycin ophthalmic ointment Place a 1/2 inch ribbon of ointment into the lower eyelid three times daily for 5-7 days. 09/05/17   Aviva Kluver B, PA-C  hydrochlorothiazide (HYDRODIURIL) 25 MG tablet Take 25 mg by mouth daily. Reported on 11/09/2015    [provider]  ibuprofen (ADVIL,MOTRIN) 200 MG tablet Take 400 mg by mouth every 6 (six) hours as needed for headache.     [provider]  magic mouthwash SOLN Take 5 mLs by mouth 3 (three) times daily as needed for mouth pain. 08/25/17   Mathews Robinsons B, PA-C  metFORMIN (GLUCOPHAGE) 500 MG tablet Take 1 tablet (500 mg total) by mouth daily with breakfast. 01/22/17 01/22/18  Burnard Leigh, MD  metoprolol (LOPRESSOR) 50 MG tablet Take 50 mg by mouth 2 (two) times daily.    [provider]  naproxen (NAPROSYN) 375 MG tablet Take 1 tablet (375 mg total) by mouth 2 (two) times daily. Patient not taking: Reported  on 08/25/2017 03/10/17   Janne Napoleon, NP  NICOTINE STEP 3 7 MG/24HR patch PLACE 1 PATCH (7 MG TOTAL) ONTO THE SKIN DAILY. 02/17/17   Burnard Leigh, MD  QUEtiapine (SEROQUEL) 400 MG tablet Take 1 tablet (400 mg total) by mouth at bedtime. 06/25/17   Eksir, Bo Mcclintock, MD    Family History Family History  Problem Relation Age of Onset  . Drug abuse Mother   . Alcohol abuse Maternal Uncle   . Drug abuse Maternal Uncle     Social History Social History   Tobacco Use  . Smoking status: Current Every Day Smoker    Packs/day: 0.50    Types: Cigarettes     Last attempt to quit: 08/29/2016    Years since quitting: 1.0  . Smokeless tobacco: Never Used  . Tobacco comment: patient is vaping and has cut back to 5 a week.   Substance Use Topics  . Alcohol use: No  . Drug use: No     Allergies   Patient has no known allergies.   Review of Systems Review of Systems  Constitutional: Negative for chills and fever.  Eyes: Positive for discharge and redness. Negative for visual disturbance.  Allergic/Immunologic: Negative for immunocompromised state.  Neurological: Negative for headaches.     Physical Exam Updated Vital Signs BP (!) 144/101 (BP Location: Right Arm)   Pulse 76   Temp 99.1 F (37.3 C) (Oral)   Resp 18   Ht 5\' 7"  (1.702 m)   Wt 107.5 kg (237 lb)   LMP 10/12/2011   SpO2 100%   BMI 37.12 kg/m   Physical Exam  Constitutional: She appears well-developed and well-nourished. No distress.  Sitting comfortably in bed.  HENT:  Head: Normocephalic and atraumatic.  Eyes: Right eye exhibits discharge. Left eye exhibits no discharge.  Right Eye Exam: Mild periorbital edema without erythema. Diffuse scleral injection. Conjunctival chemosis. No foreign bodies identified on lid eversion. PERRL. EOMI and no pain with extraocular movements. Wood's lamp examination demonstrates no corneal uptakes.    Neck: Normal range of motion.  Cardiovascular: Normal rate and regular rhythm.  Intact, 2+ radial pulse.  Pulmonary/Chest:  Normal respiratory effort. Patient converses comfortably. No audible wheeze or stridor.  Abdominal: She exhibits no distension.  Musculoskeletal: Normal range of motion.  Neurological: She is alert.  Cranial nerves intact to gross observation. Patient moves extremities without difficulty.  Skin: Skin is warm and dry. She is not diaphoretic.  Psychiatric: She has a normal mood and affect. Her behavior is normal. Judgment and thought content normal.  Nursing note and vitals reviewed.    Visual Acuity  Right Eye  Distance: 20/20 with corrective lens Left Eye Distance: 20/20 with corrective lens Bilateral Distance: 20/20 with corrective lens  ED Treatments / Results  Labs (all labs ordered are listed, but only abnormal results are displayed) Labs Reviewed - No data to display  EKG  EKG Interpretation None       Radiology No results found.  Procedures Procedures (including critical care time)  Medications Ordered in ED Medications  tetracaine (PONTOCAINE) 0.5 % ophthalmic solution 1 drop (1 drop Both Eyes Given 09/05/17 1447)  fluorescein ophthalmic strip 1 strip (1 strip Both Eyes Given 09/05/17 1447)     Initial Impression / Assessment and Plan / ED Course  I have reviewed the triage vital signs and the nursing notes.  Pertinent labs & imaging results that were available during my care of the patient were reviewed  by me and considered in my medical decision making (see chart for details).     Patient is nontoxic-appearing, afebrile, and in no acute distress.  Right eye exhibits copious drainage, as well as conjunctival chemosis and scleral injection.  Suspect that periorbital edema is due to rubbing the eye, as there is no demarcated erythema suggestive of periorbital cellulitis.  No evidence of orbital cellulitis, or features suggestive of iritis, acute angle-closure glaucoma, or uptake suggestive of keratitis.  Normal visual acuity.  Given patient's recent exposure to conjunctivitis, suspect this is the cause of patient's pain.  Will treat with erythromycin ointment.  Encourage warm compresses the eye.  Patient to follow-up with her primary care provider early next week and ophthalmology as needed.  Return precautions given for any worsening redness around the eye, visual changes, retro-orbital pain, fever chills symptoms.  Patient is in understanding and agrees with the plan of care.  Final Clinical Impressions(s) / ED Diagnoses   Final diagnoses:  Acute conjunctivitis of right eye,  unspecified acute conjunctivitis type    ED Discharge Orders        Ordered    erythromycin ophthalmic ointment     09/05/17 1455       Delia ChimesMurray, Haniel Fix B, PA-C 09/05/17 1532    Arby BarrettePfeiffer, Marcy, MD 09/07/17 [phone]

## 2017-09-05 NOTE — Discharge Instructions (Signed)
Please read and follow all provided instructions.  Your diagnoses today include:  1. Acute conjunctivitis of right eye, unspecified acute conjunctivitis type     Tests performed today include: Visual acuity testing to check your vision Fluorescein dye examination to look for scratches on your eye Vital signs. See below for your results today.   Medications prescribed:   Take any prescribed medications only as directed.  Please place a ribbon of the the erythromycin ointment on upper and lower eyelids with a Q-tip 3 times daily for the next 5-7 days.  You may discontinue after 5 days if you have clinical improvement.  You may also take Benadryl 25-50 mg every 8 hours as needed for the irritation.  Home care instructions:  Follow any educational materials contained in this packet. If you wear contact lenses, do not use them until your eye caregiver approves. Follow-up care is necessary to be sure the infection is healing if not completely resolved in 2-3 days. See your caregiver or eye specialist as suggested for followup.   If you have an eye infection, wash your hands often as this is very contagious and is easily spread from person to person.   Please apply warm compresses to the eye 3-4 times daily.  Follow-up instructions: Please follow-up with your primary care doctor OR the opthalmologist listed in the next 2-3 days for further evaluation of your symptoms.  Please follow-up with your primary care provider early next week.  I also listed an eye doctor on this paperwork.  Return instructions:  Please return to the Emergency Department if you experience worsening symptoms.  Please return immediately if you develop severe pain, pus drainage, new change in vision, or fever. Please return if you have any other emergent concerns.  Additional Information:  Your vital signs today were: BP (!) 150/99 (BP Location: Right Arm)    Pulse 93    Temp 99.1 F (37.3 C) (Oral)    Resp 16     Ht 5\' 7"  (1.702 m)    Wt 107.5 kg (237 lb)    LMP 10/12/2011    SpO2 98%    BMI 37.12 kg/m  If your blood pressure (BP) was elevated above 130/80 this visit, please have this repeated by your doctor within one month. ---------------

## 2017-09-05 NOTE — ED Triage Notes (Signed)
Pt states right eye swelling/itching that started this morning. Pt states she feels as though she has "grit" in her eye. Vitals stable. Swelling noted.

## 2017-09-19 DIAGNOSIS — L0291 Cutaneous abscess, unspecified: Secondary | ICD-10-CM | POA: Diagnosis not present

## 2017-09-25 ENCOUNTER — Ambulatory Visit (HOSPITAL_COMMUNITY): Payer: BLUE CROSS/BLUE SHIELD | Admitting: Psychiatry

## 2017-09-25 ENCOUNTER — Encounter (HOSPITAL_COMMUNITY): Payer: Self-pay | Admitting: Psychiatry

## 2017-09-25 DIAGNOSIS — Z811 Family history of alcohol abuse and dependence: Secondary | ICD-10-CM | POA: Diagnosis not present

## 2017-09-25 DIAGNOSIS — F17210 Nicotine dependence, cigarettes, uncomplicated: Secondary | ICD-10-CM

## 2017-09-25 DIAGNOSIS — F411 Generalized anxiety disorder: Secondary | ICD-10-CM | POA: Diagnosis not present

## 2017-09-25 DIAGNOSIS — Z5689 Other problems related to employment: Secondary | ICD-10-CM

## 2017-09-25 DIAGNOSIS — Z813 Family history of other psychoactive substance abuse and dependence: Secondary | ICD-10-CM

## 2017-09-25 DIAGNOSIS — F319 Bipolar disorder, unspecified: Secondary | ICD-10-CM

## 2017-09-25 MED ORDER — ALPRAZOLAM ER 2 MG PO TB24: 2.0000 mg | ORAL_TABLET | ORAL | 2 refills | Status: DC

## 2017-09-25 MED ORDER — QUETIAPINE FUMARATE 400 MG PO TABS: 400.0000 mg | ORAL_TABLET | Freq: Every day | ORAL | 1 refills | Status: DC

## 2017-09-25 NOTE — Progress Notes (Signed)
[facility] MD/PA/NP OP Progress Note  09/25/2017 10:57 AM Jill CurtRhonda C [name]  MRN:  [id number]  Chief Complaint: med management  HPI: Jill CurtRhonda C [name] reports that her mood and anxiety have been well with seroquel and Xanax XR.  I spent time with her processing some of her frustrations about her job, particularly the low salary.  She reports that she had to fight very hard to get a $0.50 raise in her salary.  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  She reports that this was a helpful reminder, because she does not want to lose sight of the value of what she does in her work.  I spent time with her reviewing some book recommendations that may help her find more value in her work during times of frustration.  She reports that she is sleeping well at night with Seroquel, and finds that her anxiety is very easy to me and XR once a day.  Visit Diagnosis:    ICD-10-CM   1. Generalized anxiety disorder F41.1 ALPRAZolam (ALPRAZOLAM XR) 2 MG 24 hr tablet  2. Bipolar I disorder (HCC) F31.9 QUEtiapine (SEROQUEL) 400 MG tablet   Past Psychiatric History: See intake H&P for full details. Reviewed, with no updates at this time.  Past Medical History:  Past Medical History:  Diagnosis Date  . Anxiety   . Bipolar 1 disorder (HCC)   . Hypertension   . Post-traumatic stress syndrome     Past Surgical History:  Procedure Laterality Date  . BLADDER SURGERY    . CHOLECYSTECTOMY    . TUBAL LIGATION      Family Psychiatric History: See intake H&P for full details. Reviewed, with no updates at this time.   Family History:  Family History  Problem Relation Age of Onset  . Drug abuse Mother   . Alcohol abuse Maternal Uncle   . Drug abuse Maternal Uncle     Social History:  Social History    Socioeconomic History  . Marital status: Single    Spouse name: Not on file  . Number of children: 2  . Years of education: 3214  . Highest education level: Not on file  Social Needs  . Financial resource strain: Not on file  . Food insecurity - worry: Not on file  . Food insecurity - inability: Not on file  . Transportation needs - medical: Not on file  . Transportation needs - non-medical: Not on file  Occupational History  . Not on file  Tobacco Use  . Smoking status: Current Every Day Smoker    Packs/day: 0.50    Types: Cigarettes    Last attempt to quit: 08/29/2016    Years since quitting: 1.0  . Smokeless tobacco: Never Used  . Tobacco comment: patient is vaping and has cut back to 5 a week.   Substance and Sexual Activity  . Alcohol use: No  . Drug use: No  . Sexual activity: No    Birth control/protection: Surgical  Other Topics Concern  . Not on file  Social History Narrative   [place] 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 Born and raised in [place] by mom until 7 then by grandparents. Pt has one  older brother. 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     Allergies: No Known Allergies  Metabolic Disorder Labs: No results found for: HGBA1C, MPG No results found for: PROLACTIN No results found for: CHOL, TRIG, HDL, CHOLHDL, VLDL, LDLCALC No results found for: TSH  Therapeutic Level Labs: No results found for: LITHIUM No results found for: VALPROATE No components found for:  CBMZ  Current Medications: Current Outpatient Medications  Medication Sig Dispense Refill  . acetaminophen (TYLENOL) 500 MG tablet Take 2 tablets (1,000 mg total) by mouth every 6 (six) hours as needed for moderate pain. 30 tablet 0  . ALPRAZolam (ALPRAZOLAM XR) 2 MG 24 hr tablet Take 1 tablet (2 mg total) by mouth every morning. 30 tablet 2  . amoxicillin (AMOXIL) 500 MG capsule Take 1 capsule (500 mg total) by mouth  3 (three) times daily. (Patient not taking: Reported on 08/25/2017) 30 capsule 0  . cholecalciferol (VITAMIN D) 400 UNITS TABS tablet Take 1,200 Units by mouth 2 (two) times a week. Reported on 11/09/2015    . erythromycin ophthalmic ointment Place a 1/2 inch ribbon of ointment into the lower eyelid three times daily for 5-7 days. 3.5 g 0  . hydrochlorothiazide (HYDRODIURIL) 25 MG tablet Take 25 mg by mouth daily. Reported on 11/09/2015    . ibuprofen (ADVIL,MOTRIN) 200 MG tablet Take 400 mg by mouth every 6 (six) hours as needed for headache.     . magic mouthwash SOLN Take 5 mLs by mouth 3 (three) times daily as needed for mouth pain. 50 mL 0  . metFORMIN (GLUCOPHAGE) 500 MG tablet Take 1 tablet (500 mg total) by mouth daily with breakfast. 30 tablet 2  . metoprolol (LOPRESSOR) 50 MG tablet Take 50 mg by mouth 2 (two) times daily.    . naproxen (NAPROSYN) 375 MG tablet Take 1 tablet (375 mg total) by mouth 2 (two) times daily. (Patient not taking: Reported on 08/25/2017) 20 tablet 0  . NICOTINE STEP 3 7 MG/24HR patch PLACE 1 PATCH (7 MG TOTAL) ONTO THE SKIN DAILY. 28 patch 0  . QUEtiapine (SEROQUEL) 400 MG tablet Take 1 tablet (400 mg total) by mouth at bedtime. 90 tablet 1   No current facility-administered medications for this visit.      Musculoskeletal: Strength & Muscle Tone: within normal limits Gait & Station: normal Patient leans: N/A  Psychiatric Specialty Exam: ROS  Last menstrual period 10/12/2011.There is no height or weight on file to calculate BMI.  General Appearance: Casual and Well Groomed  Eye Contact:  Good  Speech:  Clear and Coherent and Normal Rate  Volume:  Normal  Mood:  Euthymic  Affect:  Appropriate and Congruent  Thought Process:  Goal Directed and Descriptions of Associations: Intact  Orientation:  Full (Time, Place, and Person)  Thought Content: Logical   Suicidal Thoughts:  No  Homicidal Thoughts:  No  Memory:  Immediate;   Good  Judgement:  Fair   Insight:  Fair  Psychomotor Activity:  Normal  Concentration:  Concentration: Good  Recall:  Good  Fund of Knowledge: Good  Language: Good  Akathisia:  Negative  Handed:  Right  AIMS (if indicated): done  Assets:  Communication Skills Desire for Improvement Financial Resources/Insurance Housing Social Support Talents/Skills Transportation Vocational/Educational  ADL's:  Intact  Cognition: WNL  Sleep:  Good   Screenings:   Assessment and Plan:  [name] presents with stable mood on  Seroquel 400 mg nightly, and anxiety is easy to manage, panic episodes are in remission with Xanax extended release.  She had lipid panel and hemoglobin A1c recently checked last month by her primary care provider, Dr. Tenny Craw.  She reports that her hemoglobin A1c was borderline and she will return to see him in a couple months to have it rechecked.  She denies any acute safety issues and is overall stable on her current medication regimen.  We will follow-up in 3 months for routine medication monitoring.  No abnormality of movements on examination today.  1. Generalized anxiety disorder   2. Bipolar I disorder (HCC)     Status of current problems: stable  Labs Ordered: No orders of the defined types were placed in this encounter.   Labs Reviewed: n/a  Collateral Obtained/Records Reviewed: n/a  Plan:  Continue Seroquel 400 mg nightly Continue Xanax extended release 2 mg every morning Return to clinic in 3 months  I spent 20 minutes with the patient in direct face-to-face clinical care.  Greater than 50% of this time was spent in counseling and coordination of care with the patient.    Burnard Leigh, MD 09/25/2017, 10:57 AM

## 2017-10-13 DIAGNOSIS — Z23 Encounter for immunization: Secondary | ICD-10-CM | POA: Diagnosis not present

## 2017-10-13 DIAGNOSIS — Z0000 Encounter for general adult medical examination without abnormal findings: Secondary | ICD-10-CM | POA: Diagnosis not present

## 2017-10-17 DIAGNOSIS — E538 Deficiency of other specified B group vitamins: Secondary | ICD-10-CM | POA: Diagnosis not present

## 2017-11-21 DIAGNOSIS — E538 Deficiency of other specified B group vitamins: Secondary | ICD-10-CM | POA: Diagnosis not present

## 2017-12-23 ENCOUNTER — Ambulatory Visit (INDEPENDENT_AMBULATORY_CARE_PROVIDER_SITE_OTHER): Payer: BLUE CROSS/BLUE SHIELD | Admitting: Psychiatry

## 2017-12-23 ENCOUNTER — Encounter (HOSPITAL_COMMUNITY): Payer: Self-pay | Admitting: Psychiatry

## 2017-12-23 DIAGNOSIS — F411 Generalized anxiety disorder: Secondary | ICD-10-CM

## 2017-12-23 DIAGNOSIS — Z811 Family history of alcohol abuse and dependence: Secondary | ICD-10-CM | POA: Diagnosis not present

## 2017-12-23 DIAGNOSIS — F319 Bipolar disorder, unspecified: Secondary | ICD-10-CM | POA: Diagnosis not present

## 2017-12-23 DIAGNOSIS — Z813 Family history of other psychoactive substance abuse and dependence: Secondary | ICD-10-CM

## 2017-12-23 DIAGNOSIS — Z79899 Other long term (current) drug therapy: Secondary | ICD-10-CM | POA: Diagnosis not present

## 2017-12-23 DIAGNOSIS — F17210 Nicotine dependence, cigarettes, uncomplicated: Secondary | ICD-10-CM | POA: Diagnosis not present

## 2017-12-23 MED ORDER — ALPRAZOLAM ER 2 MG PO TB24: 2.0000 mg | ORAL_TABLET | ORAL | 3 refills | Status: DC

## 2017-12-23 MED ORDER — QUETIAPINE FUMARATE 400 MG PO TABS: 400.0000 mg | ORAL_TABLET | Freq: Every day | ORAL | 1 refills | Status: DC

## 2017-12-23 NOTE — Progress Notes (Signed)
BH MD/PA/NP OP Progress Note  12/23/2017 11:04 AM Jill [name]  MRN:  [id number]  Chief Complaint: med management  HPI: Jill [name] reports that she has recently started a new job, and has lost a substantial amount of weight because of health physical and active her new job is, working at [employer] and [employer].  She has a substantial amount of sweating, due to the heat, encouraged her to stay hydrated, and keep fluids and water with her.  She is working on some of these practical aspects of learning the job.  She is sleeping well at night, anxiety has been steady and well managed.  She understands Clinical research associate is transitioning out of office in 3 months and we will follow-up at the end of August prior to my departure from the clinic.  Visit Diagnosis:    ICD-10-CM   1. Bipolar I disorder (HCC) F31.9 QUEtiapine (SEROQUEL) 400 MG tablet  2. Generalized anxiety disorder F41.1 ALPRAZolam (ALPRAZOLAM XR) 2 MG 24 hr tablet   Past Psychiatric History: See intake H&P for full details. Reviewed, with no updates at this time.  Past Medical History:  Past Medical History:  Diagnosis Date  . Anxiety   . Bipolar 1 disorder (HCC)   . Hypertension   . Post-traumatic stress syndrome     Past Surgical History:  Procedure Laterality Date  . BLADDER SURGERY    . CHOLECYSTECTOMY    . TUBAL LIGATION      Family Psychiatric History: See intake H&P for full details. Reviewed, with no updates at this time.   Family History:  Family History  Problem Relation Age of Onset  . Drug abuse Mother   . Alcohol abuse Maternal Uncle   . Drug abuse Maternal Uncle     Social History:  Social History   Socioeconomic History  . Marital status: Single    Spouse name: Not on file  . Number of children: 2  . Years of education: 61  . Highest education level: Not on file  Occupational History  . Not on file  Social Needs  . Financial resource strain: Not on file  . Food insecurity:    Worry: Not on  file    Inability: Not on file  . Transportation needs:    Medical: Not on file    Non-medical: Not on file  Tobacco Use  . Smoking status: Current Every Day Smoker    Packs/day: 0.50    Types: Cigarettes    Last attempt to quit: 08/29/2016    Years since quitting: 1.3  . Smokeless tobacco: Never Used  . Tobacco comment: patient is vaping and has cut back to 5 a week.   Substance and Sexual Activity  . Alcohol use: No  . Drug use: No    Types: Marijuana  . Sexual activity: Never    Birth control/protection: Surgical  Lifestyle  . Physical activity:    Days per week: Not on file    Minutes per session: Not on file  . Stress: Not on file  Relationships  . Social connections:    Talks on phone: Not on file    Gets together: Not on file    Attends religious service: Not on file    Active member of club or organization: Not on file    Attends meetings of clubs or organizations: Not on file    Relationship status: Not on file  Other Topics Concern  . Not on file  Social History Narrative  Pt lives with her son in [place]. 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 Born and raised in [place] by mom until 7 then by grandparents. Pt has one older brother. 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     Allergies: No Known Allergies  Metabolic Disorder Labs: No results found for: HGBA1C, MPG No results found for: PROLACTIN No results found for: CHOL, TRIG, HDL, CHOLHDL, VLDL, LDLCALC No results found for: TSH  Therapeutic Level Labs: No results found for: LITHIUM No results found for: VALPROATE No components found for:  CBMZ  Current Medications: Current Outpatient Medications  Medication Sig Dispense Refill  . acetaminophen (TYLENOL) 500 MG tablet Take 2 tablets (1,000 mg total) by mouth every 6 (six) hours as needed for moderate pain. 30 tablet 0  . ALPRAZolam (ALPRAZOLAM XR) 2 MG 24 hr tablet Take 1 tablet (2 mg total) by mouth  every morning. 30 tablet 3  . cholecalciferol (VITAMIN D) 400 UNITS TABS tablet Take 1,200 Units by mouth 2 (two) times a week. Reported on 11/09/2015    . hydrochlorothiazide (HYDRODIURIL) 25 MG tablet Take 25 mg by mouth daily. Reported on 11/09/2015    . ibuprofen (ADVIL,MOTRIN) 200 MG tablet Take 400 mg by mouth every 6 (six) hours as needed for headache.     . metoprolol (LOPRESSOR) 50 MG tablet Take 50 mg by mouth 2 (two) times daily.    . QUEtiapine (SEROQUEL) 400 MG tablet Take 1 tablet (400 mg total) by mouth at bedtime. 90 tablet 1   No current facility-administered medications for this visit.      Musculoskeletal: Strength & Muscle Tone: within normal limits Gait & Station: normal Patient leans: N/A  Psychiatric Specialty Exam: ROS   Blood pressure 113/78, pulse 93, height  (1.702 m), weight 227 lb 9.6 oz (103.2 kg), last menstrual period 10/12/2011, SpO2 98 %.Body mass index is 35.65 kg/m.  General Appearance: Casual and Well Groomed  Eye Contact:  Good  Speech:  Clear and Coherent and Normal Rate  Volume:  Normal  Mood:  Euthymic  Affect:  Appropriate and Congruent  Thought Process:  Goal Directed and Descriptions of Associations: Intact  Orientation:  Full (Time, Place, and Person)  Thought Content: Logical   Suicidal Thoughts:  No  Homicidal Thoughts:  No  Memory:  Immediate;   Good  Judgement:  Fair  Insight:  Fair  Psychomotor Activity:  Normal  Concentration:  Concentration: Good  Recall:  Good  Fund of Knowledge: Good  Language: Good  Akathisia:  Negative  Handed:  Right  AIMS (if indicated): done  Assets:  Communication Skills Desire for Improvement Financial Resources/Insurance Housing Social Support Talents/Skills Transportation Vocational/Educational  ADL's:  Intact  Cognition: WNL  Sleep:  Good   Screenings:   Assessment and Plan:  Jill [name] presents with stable mood on Seroquel 400 mg nightly, in addition to Xanax extended  release.  No acute safety issues and she has made some positive changes in her life recently, and seems to be doing better in terms of her anxiety with the new job at [employer].  No acute safety issues, we will follow-up in 3 months.  Disclosed to patient that this Clinical research associate is leaving this practice at the end of August 2019, and patients always has the right to choose their provider. Reassured patient that office will work to provide smooth transition of care whether they wish to  remain at this office, or to continue with this provider, or seek alternative care options in community.  They expressed understanding.   1. Bipolar I disorder (HCC)   2. Generalized anxiety disorder     Status of current problems: stable  Labs Ordered: No orders of the defined types were placed in this encounter.   Labs Reviewed: n/a  Collateral Obtained/Records Reviewed: n/a  Plan:  Continue Seroquel 400 mg nightly Continue Xanax extended release 2 mg every morning Return to clinic in 3 months  I spent 20 minutes with the patient in direct face-to-face clinical care.  Greater than 50% of this time was spent in counseling and coordination of care with the patient.    Burnard Leigh, MD 12/23/2017, 11:04 AM

## 2017-12-30 DIAGNOSIS — E538 Deficiency of other specified B group vitamins: Secondary | ICD-10-CM | POA: Diagnosis not present

## 2018-01-05 ENCOUNTER — Other Ambulatory Visit: Payer: Self-pay

## 2018-01-05 ENCOUNTER — Emergency Department (HOSPITAL_COMMUNITY)
Admission: EM | Admit: 2018-01-05 | Discharge: 2018-01-05 | Disposition: A | Discharge status: Home / Self Care | Payer: BLUE CROSS/BLUE SHIELD | Attending: Emergency Medicine | Admitting: Emergency Medicine

## 2018-01-05 ENCOUNTER — Encounter (HOSPITAL_COMMUNITY): Payer: Self-pay

## 2018-01-05 DIAGNOSIS — F17210 Nicotine dependence, cigarettes, uncomplicated: Secondary | ICD-10-CM | POA: Insufficient documentation

## 2018-01-05 DIAGNOSIS — Z79899 Other long term (current) drug therapy: Secondary | ICD-10-CM | POA: Diagnosis not present

## 2018-01-05 DIAGNOSIS — I10 Essential (primary) hypertension: Secondary | ICD-10-CM | POA: Diagnosis not present

## 2018-01-05 DIAGNOSIS — M546 Pain in thoracic spine: Secondary | ICD-10-CM | POA: Insufficient documentation

## 2018-01-05 MED ORDER — DEXAMETHASONE 4 MG PO TABS
12.0000 mg | ORAL_TABLET | Freq: Once | ORAL | Status: AC
  Administered 2018-01-05: 12 mg via ORAL
  Filled 2018-01-05: qty 3

## 2018-01-05 MED ORDER — CYCLOBENZAPRINE HCL 10 MG PO TABS: 10.0000 mg | ORAL_TABLET | Freq: Three times a day (TID) | ORAL | 0 refills | Status: DC | PRN

## 2018-01-05 MED ORDER — TRAMADOL HCL 50 MG PO TABS: 50.0000 mg | ORAL_TABLET | Freq: Four times a day (QID) | ORAL | 0 refills | Status: DC | PRN

## 2018-01-05 MED ORDER — LORAZEPAM 0.5 MG PO TABS
0.5000 mg | ORAL_TABLET | Freq: Once | ORAL | Status: AC
  Administered 2018-01-05: 0.5 mg via ORAL
  Filled 2018-01-05: qty 1

## 2018-01-05 MED ORDER — HYDROMORPHONE HCL 1 MG/ML IJ SOLN
0.7500 mg | Freq: Once | INTRAMUSCULAR | Status: AC
  Administered 2018-01-05: 0.75 mg via INTRAMUSCULAR
  Filled 2018-01-05: qty 1

## 2018-01-05 NOTE — ED Triage Notes (Signed)
Patient c/o right mid back pain x 2 days. Patient reports that she has a job that requires lifting and was unable to complete her shift tonight. Patient states she has been taking Ibuprofen with very little relief.

## 2018-01-10 ENCOUNTER — Other Ambulatory Visit: Payer: Self-pay

## 2018-01-10 ENCOUNTER — Encounter (HOSPITAL_COMMUNITY): Payer: Self-pay | Admitting: Emergency Medicine

## 2018-01-10 ENCOUNTER — Emergency Department (HOSPITAL_COMMUNITY)
Admission: EM | Admit: 2018-01-10 | Discharge: 2018-01-10 | Disposition: A | Discharge status: Home / Self Care | Payer: BLUE CROSS/BLUE SHIELD | Attending: Emergency Medicine | Admitting: Emergency Medicine

## 2018-01-10 DIAGNOSIS — F17210 Nicotine dependence, cigarettes, uncomplicated: Secondary | ICD-10-CM | POA: Diagnosis not present

## 2018-01-10 DIAGNOSIS — I10 Essential (primary) hypertension: Secondary | ICD-10-CM | POA: Insufficient documentation

## 2018-01-10 DIAGNOSIS — S29012A Strain of muscle and tendon of back wall of thorax, initial encounter: Secondary | ICD-10-CM | POA: Diagnosis not present

## 2018-01-10 DIAGNOSIS — Y999 Unspecified external cause status: Secondary | ICD-10-CM | POA: Insufficient documentation

## 2018-01-10 DIAGNOSIS — Y939 Activity, unspecified: Secondary | ICD-10-CM | POA: Diagnosis not present

## 2018-01-10 DIAGNOSIS — X58XXXA Exposure to other specified factors, initial encounter: Secondary | ICD-10-CM | POA: Insufficient documentation

## 2018-01-10 DIAGNOSIS — M546 Pain in thoracic spine: Secondary | ICD-10-CM | POA: Diagnosis not present

## 2018-01-10 DIAGNOSIS — Y929 Unspecified place or not applicable: Secondary | ICD-10-CM | POA: Insufficient documentation

## 2018-01-10 DIAGNOSIS — T148XXA Other injury of unspecified body region, initial encounter: Secondary | ICD-10-CM

## 2018-01-10 MED ORDER — HYDROMORPHONE HCL 1 MG/ML IJ SOLN
1.0000 mg | Freq: Once | INTRAMUSCULAR | Status: AC
  Administered 2018-01-10: 1 mg via INTRAMUSCULAR
  Filled 2018-01-10: qty 1

## 2018-01-10 MED ORDER — CYCLOBENZAPRINE HCL 10 MG PO TABS: 10.0000 mg | ORAL_TABLET | Freq: Three times a day (TID) | ORAL | 0 refills | Status: DC | PRN

## 2018-01-10 NOTE — ED Triage Notes (Signed)
Patient is complaining of mid right back pain. Patient states that she was seen 01/05/2018 and it has not gotten any better.

## 2018-01-10 NOTE — ED Provider Notes (Signed)
[facility]-EMERGENCY DEPT Provider Note   CSN: [id number] Arrival date & time: 01/10/18  0441     History   Chief Complaint Chief Complaint  Patient presents with  . Back Pain    HPI Jill [name] is a 52 y.o. female.  Patient presents to the emergency department with a chief complaint of right-sided back pain.  She states that the symptoms started about a week ago while at work.  She states that she strained a muscle in her back.  She reports pain with movement and palpation.  She denies any fever, chills, or cough.  Denies any chest pain.  Denies any rib pain.  Denies any hematuria or dysuria.  Denies any abdominal pain.  She has tried taking ibuprofen and a muscle relaxer with some relief.  She states that she was not able to take any time off because of her financial situation, but now realizes that she does need to take some time so that she can recover.  The history is provided by the patient. No language interpreter was used.    Past Medical History:  Diagnosis Date  . Anxiety   . Bipolar 1 disorder (HCC)   . Hypertension   . Post-traumatic stress syndrome     Patient Active Problem List   Diagnosis Date Noted  . Alcohol abuse 11/09/2015  . Insomnia 11/09/2015  . PTSD (post-traumatic stress disorder) 11/09/2015  . GAD (generalized anxiety disorder) 11/09/2015  . 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 BLADDER SURGERY    . CHOLECYSTECTOMY    . TUBAL LIGATION       OB History   None      Home Medications    Prior to Admission medications   Medication Sig Start Date End Date Taking? Authorizing Provider  acetaminophen (TYLENOL) 500 MG tablet Take 2 tablets (1,000 mg total) by mouth every 6 (six) hours as needed for moderate pain. 08/25/17   Georgiana ShoreMitchell, Jessica B, PA-C  ALPRAZolam (ALPRAZOLAM XR) 2 MG 24 hr tablet Take 1  tablet (2 mg total) by mouth every morning. 12/23/17   Rene KocherEksir, Bo McclintockAlexander Arya, MD  cholecalciferol (VITAMIN D) 400 UNITS TABS tablet Take 1,200 Units by mouth 2 (two) times a week. Reported on 11/09/2015    [provider]  cyclobenzaprine (FLEXERIL) 10 MG tablet Take 1 tablet (10 mg total) by mouth 3 (three) times daily as needed for muscle spasms. 01/05/18   Raeford RazorKohut, Stephen, MD  hydrochlorothiazide (HYDRODIURIL) 25 MG tablet Take 25 mg by mouth daily. Reported on 11/09/2015    [provider]  ibuprofen (ADVIL,MOTRIN) 200 MG tablet Take 400 mg by mouth every 6 (six) hours as needed for headache.     [provider]  metoprolol (LOPRESSOR) 50 MG tablet Take 50 mg by mouth 2 (two) times daily.    [provider]  QUEtiapine (SEROQUEL) 400 MG tablet Take 1 tablet (400 mg total) by mouth at bedtime. 12/23/17   Eksir, Bo McclintockAlexander Arya, MD  traMADol (ULTRAM) 50 MG tablet Take 1 tablet (50 mg total) by mouth every 6 (six) hours as needed. 01/05/18   Raeford RazorKohut, Stephen, MD    Family History Family History  Problem Relation Age of Onset  . Drug abuse Mother   . Alcohol abuse Maternal Uncle   . Drug abuse Maternal Uncle     Social History Social History   Tobacco Use  .  Smoking status: Current Every Day Smoker    Packs/day: 0.50    Types: Cigarettes    Last attempt to quit: 08/29/2016    Years since quitting: 1.3  . Smokeless tobacco: Never Used  . Tobacco comment: patient is vaping and has cut back to 5 a week.   Substance Use Topics  . Alcohol use: No  . Drug use: Not Currently    Types: Marijuana     Allergies   Patient has no known allergies.   Review of Systems Review of Systems  All other systems reviewed and are negative.    Physical Exam Updated Vital Signs BP (!) 129/94 (BP Location: Left Arm)   Pulse 82   Temp 98.4 F (36.9 C) (Oral)   Resp 18   Ht 5\' 7"  (1.702 m)   Wt 102.1 kg (225 lb)   LMP 10/12/2011   SpO2 98%   BMI 35.24 kg/m    Physical Exam  Constitutional: She is oriented to person, place, and time. No distress.  HENT:  Head: Normocephalic and atraumatic.  Eyes: Pupils are equal, round, and reactive to light. Conjunctivae and EOM are normal.  Neck: No tracheal deviation present.  Cardiovascular: Normal rate.  Pulmonary/Chest: Effort normal. No respiratory distress.  Abdominal: Soft.  Musculoskeletal: Normal range of motion.  Right sided thoracic back musculature tender to palpation, but no bony abnormality or deformity, no crepitus, no step-off of the spine  Neurological: She is alert and oriented to person, place, and time.  Skin: Skin is warm and dry. She is not diaphoretic.  No rash or cellulitis or evidence of zoster  Psychiatric: Judgment normal.  Nursing note and vitals reviewed.    ED Treatments / Results  Labs (all labs ordered are listed, but only abnormal results are displayed) Labs Reviewed - No data to display  EKG None  Radiology No results found.  Procedures Procedures (including critical care time)  Medications Ordered in ED Medications  HYDROmorphone (DILAUDID) injection 1 mg (has no administration in time range)     Initial Impression / Assessment and Plan / ED Course  I have reviewed the triage vital signs and the nursing notes.  Pertinent labs & imaging results that were available during my care of the patient were reviewed by me and considered in my medical decision making (see chart for details).     Patient with back pain.    No neurological deficits and normal neuro exam.  Patient is ambulatory.  No loss of bowel or bladder control.  Doubt cauda equina.  Denies fever,  doubt epidural abscess or other lesion. Recommend back exercises, stretching, RICE, and will treat with a short course of flexeril.      Encouraged the patient that there could be a need for additional workup and/or imaging such as MRI, if the symptoms do not resolve. Patient advised that if  the back pain does not resolve, or radiates, this could progress to more serious conditions and is encouraged to follow-up with PCP or orthopedics within 2 weeks.     Final Clinical Impressions(s) / ED Diagnoses   Final diagnoses:  Muscle strain    ED Discharge Orders        Ordered    cyclobenzaprine (FLEXERIL) 10 MG tablet  3 times daily PRN     01/10/18 0510       Roxy Horseman, PA-C 01/10/18 1610    Gilda Crease, MD 01/10/18 [phone]

## 2018-01-11 NOTE — ED Provider Notes (Signed)
[facility]-EMERGENCY DEPT Provider Note   CSN: [id number] Arrival date & time: 01/05/18  7829     History   Chief Complaint Chief Complaint  Patient presents with  . Back Pain    HPI Jill [name] is a 52 y.o. female.  HPI   52 year old female with right-sided back pain.  Onset a few days ago.  She felt like she pulled a muscle in her back.  Denies any discrete trauma.  Pain worsened over a couple days to the point she collection in the emergency room.  Worse with movement.  No abdominal pain.  No radicular symptoms.  Complaints.  Past Medical History:  Diagnosis Date  . Anxiety   . Bipolar 1 disorder (HCC)   . Hypertension   . Post-traumatic stress syndrome     Patient Active Problem List   Diagnosis Date Noted  . Alcohol abuse 11/09/2015  . Insomnia 11/09/2015  . PTSD (post-traumatic stress disorder) 11/09/2015  . GAD (generalized anxiety disorder) 11/09/2015  . 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 BLADDER SURGERY    . CHOLECYSTECTOMY    . TUBAL LIGATION       OB History   None      Home Medications    Prior to Admission medications   Medication Sig Start Date End Date Taking? Authorizing Provider  acetaminophen (TYLENOL) 500 MG tablet Take 2 tablets (1,000 mg total) by mouth every 6 (six) hours as needed for moderate pain. 08/25/17   Georgiana Shore, PA-C  ALPRAZolam (ALPRAZOLAM XR) 2 MG 24 hr tablet Take 1 tablet (2 mg total) by mouth every morning. 12/23/17   Rene Kocher, Bo Mcclintock, MD  cholecalciferol (VITAMIN D) 400 UNITS TABS tablet Take 1,200 Units by mouth 2 (two) times a week. Reported on 11/09/2015    [provider]  cyclobenzaprine (FLEXERIL) 10 MG tablet Take 1 tablet (10 mg total) by mouth 3 (three) times daily as needed for muscle spasms. 01/10/18   Roxy Horseman, PA-C  hydrochlorothiazide  (HYDRODIURIL) 25 MG tablet Take 25 mg by mouth daily. Reported on 11/09/2015    [provider]  ibuprofen (ADVIL,MOTRIN) 200 MG tablet Take 400 mg by mouth every 6 (six) hours as needed for headache.     [provider]  metoprolol (LOPRESSOR) 50 MG tablet Take 50 mg by mouth 2 (two) times daily.    [provider]  QUEtiapine (SEROQUEL) 400 MG tablet Take 1 tablet (400 mg total) by mouth at bedtime. 12/23/17   Eksir, Bo Mcclintock, MD  traMADol (ULTRAM) 50 MG tablet Take 1 tablet (50 mg total) by mouth every 6 (six) hours as needed. 01/05/18   Raeford Razor, MD    Family History Family History  Problem Relation Age of Onset  . Drug abuse Mother   . Alcohol abuse Maternal Uncle   . Drug abuse Maternal Uncle     Social History Social History   Tobacco Use  . Smoking status: Current Every Day Smoker    Packs/day: 0.50    Types: Cigarettes    Last attempt to quit: 08/29/2016    Years since quitting: 1.3  . Smokeless tobacco: Never Used  . Tobacco comment: patient is vaping and has cut back to 5 a week.   Substance Use Topics  . Alcohol use: No  . Drug use: Not Currently    Types: Marijuana  Allergies   Patient has no known allergies.   Review of Systems Review of Systems   Physical Exam Updated Vital Signs BP 126/70 (BP Location: Right Arm)   Pulse 98   Temp 98.4 F (36.9 C) (Oral)   Resp 18   Ht 5\' 7"  (1.702 m)   Wt 103 kg (227 lb)   LMP 10/12/2011   SpO2 99%   BMI 35.55 kg/m   Physical Exam  Constitutional: She appears well-developed and well-nourished. No distress.  HENT:  Head: Normocephalic and atraumatic.  Eyes: Conjunctivae are normal. Right eye exhibits no discharge. Left eye exhibits no discharge.  Neck: Neck supple.  Cardiovascular: Normal rate, regular rhythm and normal heart sounds. Exam reveals no gallop and no friction rub.  No murmur heard. Pulmonary/Chest: Effort normal and breath sounds normal. No respiratory  distress.  Abdominal: Soft. She exhibits no distension. There is no tenderness.  Musculoskeletal: She exhibits tenderness. She exhibits no edema.  Right thoracic back tenderness.  No midline spinal tenderness.  No overlying skin changes.  Neurovascular distally.  Neurological: She is alert.  Skin: Skin is warm and dry.  Psychiatric: She has a normal mood and affect. Her behavior is normal. Thought content normal.  Nursing note and vitals reviewed.    ED Treatments / Results  Labs (all labs ordered are listed, but only abnormal results are displayed) Labs Reviewed - No data to display  EKG None  Radiology No results found.  Procedures Procedures (including critical care time)  Medications Ordered in ED Medications  HYDROmorphone (DILAUDID) injection 0.75 mg (0.75 mg Intramuscular Given 01/05/18 0735)  LORazepam (ATIVAN) tablet 0.5 mg (0.5 mg Oral Given 01/05/18 0735)  dexamethasone (DECADRON) tablet 12 mg (12 mg Oral Given 01/05/18 0735)     Initial Impression / Assessment and Plan / ED Course  I have reviewed the triage vital signs and the nursing notes.  Pertinent labs & imaging results that were available during my care of the patient were reviewed by me and considered in my medical decision making (see chart for details).     52 year old female with right mid back pain.  Symptoms exam consistent with muscular strain.  No red flags.  Symptom medic treatment.  Final Clinical Impressions(s) / ED Diagnoses   Final diagnoses:  Acute right-sided thoracic back pain    ED Discharge Orders        Ordered    traMADol (ULTRAM) 50 MG tablet  Every 6 hours PRN     01/05/18 0732    cyclobenzaprine (FLEXERIL) 10 MG tablet  3 times daily PRN,   Status:  Discontinued     01/05/18 0732       Raeford RazorKohut, Aymar Whitfill, MD 01/11/18 [phone]

## 2018-01-16 ENCOUNTER — Emergency Department (HOSPITAL_COMMUNITY)
Admission: EM | Admit: 2018-01-16 | Discharge: 2018-01-16 | Disposition: A | Discharge status: Home / Self Care | Payer: BLUE CROSS/BLUE SHIELD | Attending: Emergency Medicine | Admitting: Emergency Medicine

## 2018-01-16 ENCOUNTER — Encounter (HOSPITAL_COMMUNITY): Payer: Self-pay

## 2018-01-16 DIAGNOSIS — S29012A Strain of muscle and tendon of back wall of thorax, initial encounter: Secondary | ICD-10-CM | POA: Insufficient documentation

## 2018-01-16 DIAGNOSIS — M546 Pain in thoracic spine: Secondary | ICD-10-CM | POA: Diagnosis not present

## 2018-01-16 DIAGNOSIS — S29012D Strain of muscle and tendon of back wall of thorax, subsequent encounter: Secondary | ICD-10-CM

## 2018-01-16 DIAGNOSIS — Y929 Unspecified place or not applicable: Secondary | ICD-10-CM | POA: Diagnosis not present

## 2018-01-16 DIAGNOSIS — X58XXXA Exposure to other specified factors, initial encounter: Secondary | ICD-10-CM | POA: Insufficient documentation

## 2018-01-16 DIAGNOSIS — S39012D Strain of muscle, fascia and tendon of lower back, subsequent encounter: Secondary | ICD-10-CM | POA: Diagnosis not present

## 2018-01-16 DIAGNOSIS — I10 Essential (primary) hypertension: Secondary | ICD-10-CM | POA: Insufficient documentation

## 2018-01-16 DIAGNOSIS — Y999 Unspecified external cause status: Secondary | ICD-10-CM | POA: Diagnosis not present

## 2018-01-16 DIAGNOSIS — F17210 Nicotine dependence, cigarettes, uncomplicated: Secondary | ICD-10-CM | POA: Insufficient documentation

## 2018-01-16 DIAGNOSIS — F4310 Post-traumatic stress disorder, unspecified: Secondary | ICD-10-CM | POA: Insufficient documentation

## 2018-01-16 DIAGNOSIS — Y939 Activity, unspecified: Secondary | ICD-10-CM | POA: Diagnosis not present

## 2018-01-16 MED ORDER — METHOCARBAMOL 500 MG PO TABS
500.0000 mg | ORAL_TABLET | Freq: Once | ORAL | Status: AC
  Administered 2018-01-16: 500 mg via ORAL
  Filled 2018-01-16: qty 1

## 2018-01-16 MED ORDER — PREDNISONE 20 MG PO TABS
60.0000 mg | ORAL_TABLET | Freq: Once | ORAL | Status: AC
  Administered 2018-01-16: 60 mg via ORAL
  Filled 2018-01-16: qty 3

## 2018-01-16 MED ORDER — KETOROLAC TROMETHAMINE 60 MG/2ML IM SOLN
60.0000 mg | Freq: Once | INTRAMUSCULAR | Status: AC
  Administered 2018-01-16: 60 mg via INTRAMUSCULAR
  Filled 2018-01-16: qty 2

## 2018-01-16 MED ORDER — PREDNISONE 20 MG PO TABS: ORAL_TABLET | ORAL | 0 refills | Status: DC

## 2018-01-16 MED ORDER — METHOCARBAMOL 500 MG PO TABS: 500.0000 mg | ORAL_TABLET | Freq: Two times a day (BID) | ORAL | 0 refills | Status: DC

## 2018-01-16 NOTE — Discharge Instructions (Signed)
Take the prescribed medication as directed.  Can continue using heating pad on the back to help with pain. Follow-up with your primary care doctor. Return to the ED for new or worsening symptoms.

## 2018-01-16 NOTE — ED Triage Notes (Signed)
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

## 2018-01-16 NOTE — ED Provider Notes (Signed)
[facility]-EMERGENCY DEPT Provider Note   CSN: [id number] Arrival date & time: 01/16/18  0413     History   Chief Complaint Chief Complaint  Patient presents with  . Back Pain    HPI Jill [name] is a 52 y.o. female.  The history is provided by the patient and medical records.  Back Pain     52 y.o. F with hx of anxiety, bipolar disorder, HTN, PTSD, presenting to the ED for right sided back pain.  Patient reports this has been ongoing for several weeks, progressively worsening.  States pain localized to same area along right thoracic back.  States he feels very "tight".  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  She is right-hand dominant and tends to push more with her right side.  She denies any numbness or weakness of her arms or legs.  She has not noticed any rash or fever.  She has been seen in the ED for this twice already, prescribed tramadol and Flexeril without much relief.  She is also been taking ibuprofen and using heat therapy at home.  She has not followed up with her primary care doctor.  Past Medical History:  Diagnosis Date  . Anxiety   . Bipolar 1 disorder (HCC)   . Hypertension   . Post-traumatic stress syndrome     Patient Active Problem List   Diagnosis Date Noted  . Alcohol abuse 11/09/2015  . Insomnia 11/09/2015  . PTSD (post-traumatic stress disorder) 11/09/2015  . GAD (generalized anxiety disorder) 11/09/2015  . 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 BLADDER SURGERY    . CHOLECYSTECTOMY    . TUBAL LIGATION       OB History   None      Home Medications    Prior to Admission medications   Medication Sig Start Date End Date Taking? Authorizing Provider  acetaminophen (TYLENOL)  500 MG tablet Take 2 tablets (1,000 mg total) by mouth every 6 (six) hours as needed for moderate pain. 08/25/17   Georgiana Shore, PA-C  ALPRAZolam (ALPRAZOLAM XR) 2 MG 24 hr tablet Take 1 tablet (2 mg total) by mouth every morning. 12/23/17   Rene Kocher, Bo Mcclintock, MD  cholecalciferol (VITAMIN D) 400 UNITS TABS tablet Take 1,200 Units by mouth 2 (two) times a week. Reported on 11/09/2015    [provider]  cyclobenzaprine (FLEXERIL) 10 MG tablet Take 1 tablet (10 mg total) by mouth 3 (three) times daily as needed for muscle spasms. 01/10/18   Roxy Horseman, PA-C  hydrochlorothiazide (HYDRODIURIL) 25 MG tablet Take 25 mg by mouth daily. Reported on 11/09/2015    [provider]  ibuprofen (ADVIL,MOTRIN) 200 MG tablet Take 400 mg by mouth every 6 (six) hours as needed for headache.     [provider]  metoprolol (LOPRESSOR) 50 MG tablet Take 50 mg by mouth 2 (two) times daily.    [provider]  QUEtiapine (SEROQUEL) 400 MG tablet Take 1 tablet (400 mg total) by mouth at bedtime. 12/23/17   Eksir, Bo Mcclintock, MD  traMADol (ULTRAM) 50 MG tablet Take 1 tablet (50 mg total) by mouth every 6 (six) hours as needed. 01/05/18   Raeford Razor, MD  Family History Family History  Problem Relation Age of Onset  . Drug abuse Mother   . Alcohol abuse Maternal Uncle   . Drug abuse Maternal Uncle     Social History Social History   Tobacco Use  . Smoking status: Current Every Day Smoker    Packs/day: 0.50    Types: Cigarettes    Last attempt to quit: 08/29/2016    Years since quitting: 1.3  . Smokeless tobacco: Never Used  . Tobacco comment: patient is vaping and has cut back to 5 a week.   Substance Use Topics  . Alcohol use: No  . Drug use: Not Currently    Types: Marijuana     Allergies   Patient has no known allergies.   Review of Systems Review of Systems  Musculoskeletal: Positive for back pain.  All other systems reviewed and are  negative.    Physical Exam Updated Vital Signs BP 128/80 (BP Location: Left Arm)   Pulse 94   Temp 97.9 F (36.6 C) (Oral)   Resp 16   Ht 5\' 7"  (1.702 m)   Wt 103 kg (227 lb)   LMP 10/12/2011   SpO2 98%   BMI 35.55 kg/m   Physical Exam  Constitutional: She is oriented to person, place, and time. She appears well-developed and well-nourished.  HENT:  Head: Normocephalic and atraumatic.  Mouth/Throat: Oropharynx is clear and moist.  Eyes: Pupils are equal, round, and reactive to light. Conjunctivae and EOM are normal.  Neck: Normal range of motion.  Cardiovascular: Normal rate, regular rhythm and normal heart sounds.  Pulmonary/Chest: Effort normal and breath sounds normal. No stridor. No respiratory distress.  Abdominal: Soft. Bowel sounds are normal. There is no tenderness. There is no rebound.  Musculoskeletal: Normal range of motion.       Back:  Muscular tenderness along the right thoracic paraspinal musculature, there is no rash or other overlying skin changes, no midline tenderness or step-off, no deformities  Neurological: She is alert and oriented to person, place, and time.  Normal strength and sensation of both arms and legs, no focal deficits, normal gait  Skin: Skin is warm and dry.  Psychiatric: She has a normal mood and affect.  Nursing note and vitals reviewed.    ED Treatments / Results  Labs (all labs ordered are listed, but only abnormal results are displayed) Labs Reviewed - No data to display  EKG None  Radiology No results found.  Procedures Procedures (including critical care time)  Medications Ordered in ED Medications  ketorolac (TORADOL) injection 60 mg (60 mg Intramuscular Given 01/16/18 0524)  methocarbamol (ROBAXIN) tablet 500 mg (500 mg Oral Given 01/16/18 0524)  predniSONE (DELTASONE) tablet 60 mg (60 mg Oral Given 01/16/18 0524)     Initial Impression / Assessment and Plan / ED Course  I have reviewed the triage vital signs and  the nursing notes.  Pertinent labs & imaging results that were available during my care of the patient were reviewed by me and considered in my medical decision making (see chart for details).  52 year old female presenting to the ED with right upper thoracic back pain.  This is her third visit in the past few weeks for same.  This seems to be musculoskeletal.  She has no acute deformities on exam, no neurologic deficits.  No midline deformity, step-off, or reported trauma.  She has no rash along the area to suggest shingles.  Will treat symptomatically.  Since patient has been seen a  few times in the ED for same, I have recommended that she follow-up closely with her primary care doctor if she continues to have ongoing issues.  She was put on light duty at work for 1 week.  Discussed plan with patient, she acknowledged understanding and agreed with plan of care.  Return precautions given for new or worsening symptoms.  Final Clinical Impressions(s) / ED Diagnoses   Final diagnoses:  Muscle strain of right upper back, subsequent encounter    ED Discharge Orders        Ordered    predniSONE (DELTASONE) 20 MG tablet     01/16/18 0528    methocarbamol (ROBAXIN) 500 MG tablet  2 times daily     01/16/18 0528       Garlon HatchetSanders, Wyonia Fontanella M, PA-C 01/16/18 0540    Melene PlanFloyd, Dan, DO 01/16/18 [phone]

## 2018-01-20 ENCOUNTER — Encounter (HOSPITAL_COMMUNITY): Payer: Self-pay

## 2018-01-20 ENCOUNTER — Other Ambulatory Visit: Payer: Self-pay

## 2018-01-20 ENCOUNTER — Emergency Department (HOSPITAL_COMMUNITY)
Admission: EM | Admit: 2018-01-20 | Discharge: 2018-01-20 | Disposition: A | Discharge status: Home / Self Care | Payer: BLUE CROSS/BLUE SHIELD | Attending: Emergency Medicine | Admitting: Emergency Medicine

## 2018-01-20 DIAGNOSIS — M546 Pain in thoracic spine: Secondary | ICD-10-CM | POA: Diagnosis not present

## 2018-01-20 DIAGNOSIS — F17210 Nicotine dependence, cigarettes, uncomplicated: Secondary | ICD-10-CM | POA: Diagnosis not present

## 2018-01-20 DIAGNOSIS — Z79899 Other long term (current) drug therapy: Secondary | ICD-10-CM | POA: Insufficient documentation

## 2018-01-20 DIAGNOSIS — M549 Dorsalgia, unspecified: Secondary | ICD-10-CM

## 2018-01-20 MED ORDER — HYDROMORPHONE HCL 1 MG/ML IJ SOLN
1.0000 mg | Freq: Once | INTRAMUSCULAR | Status: AC
  Administered 2018-01-20: 1 mg via INTRAMUSCULAR
  Filled 2018-01-20: qty 1

## 2018-01-20 MED ORDER — PREDNISONE 20 MG PO TABS: ORAL_TABLET | ORAL | 0 refills | Status: DC

## 2018-01-20 MED ORDER — CYCLOBENZAPRINE HCL 10 MG PO TABS: 10.0000 mg | ORAL_TABLET | Freq: Three times a day (TID) | ORAL | 0 refills | Status: DC | PRN

## 2018-01-20 NOTE — ED Provider Notes (Signed)
[facility]-EMERGENCY DEPT Provider Note   CSN: [id number] Arrival date & time: 01/20/18  0806     History   Chief Complaint Chief Complaint  Patient presents with  . Back Pain    HPI Jill [name] is a 52 y.o. female.  HPI  52 year old female with history of bipolar, anxiety, hypertension, alcohol abuse presenting complaining of back pain.  Patient report having pain to her right upper back ongoing for the past 2 weeks.  Pain is described as a sharp burning stabbing sensation worsening with lateral movement and with turning which she attributed to starting a new job.  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  She was seen in the ED for her complaint 4 days ago for her symptoms.  She was diagnosed with having muscle skeletal pain.  She was giving muscle relaxant, anti-inflammatory medication and steroid which did help but her medication ran out and her pain now returned.  She denies any associated fever, chills, lightheadedness, dizziness, exertional chest pain, shortness of breath, productive cough, nausea vomiting diarrhea, or rash.  No urinary symptoms, no hematuria.  Past Medical History:  Diagnosis Date  . Anxiety   . Bipolar 1 disorder (HCC)   . Hypertension   . Post-traumatic stress syndrome     Patient Active Problem List   Diagnosis Date Noted  . Alcohol abuse 11/09/2015  . Insomnia 11/09/2015  . PTSD (post-traumatic stress disorder) 11/09/2015  . GAD (generalized anxiety disorder) 11/09/2015  . 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 BLADDER SURGERY    . CHOLECYSTECTOMY    . TUBAL LIGATION       OB History   None      Home Medications    Prior to Admission medications   Medication Sig Start Date End Date Taking? Authorizing Provider  acetaminophen (TYLENOL) 500 MG tablet Take 2 tablets (1,000 mg total) by mouth every  6 (six) hours as needed for moderate pain. 08/25/17   Georgiana Shore, PA-C  ALPRAZolam (ALPRAZOLAM XR) 2 MG 24 hr tablet Take 1 tablet (2 mg total) by mouth every morning. 12/23/17   Rene Kocher, Bo Mcclintock, MD  cholecalciferol (VITAMIN D) 400 UNITS TABS tablet Take 1,200 Units by mouth 2 (two) times a week. Reported on 11/09/2015    [provider]  cyclobenzaprine (FLEXERIL) 10 MG tablet Take 1 tablet (10 mg total) by mouth 3 (three) times daily as needed for muscle spasms. 01/10/18   Roxy Horseman, PA-C  hydrochlorothiazide (HYDRODIURIL) 25 MG tablet Take 25 mg by mouth daily. Reported on 11/09/2015    [provider]  ibuprofen (ADVIL,MOTRIN) 200 MG tablet Take 400 mg by mouth every 6 (six) hours as needed for headache.     [provider]  methocarbamol (ROBAXIN) 500 MG tablet Take 1 tablet (500 mg total) by mouth 2 (two) times daily. 01/16/18   Garlon Hatchet, PA-C  metoprolol (LOPRESSOR) 50 MG tablet Take 50 mg by mouth 2 (two) times daily.    [provider]  predniSONE (DELTASONE) 20 MG tablet Take 40 mg by mouth daily for 3 days, then 20mg  by mouth daily for 3 days, then 10mg  daily for 3 days 01/16/18   Garlon Hatchet, PA-C  QUEtiapine (SEROQUEL) 400 MG tablet Take 1 tablet (400 mg total) by mouth at bedtime. 12/23/17   Burnard Leigh, MD  traMADol Janean Sark) 50 MG  tablet Take 1 tablet (50 mg total) by mouth every 6 (six) hours as needed. 01/05/18   Raeford RazorKohut, Stephen, MD    Family History Family History  Problem Relation Age of Onset  . Drug abuse Mother   . Alcohol abuse Maternal Uncle   . Drug abuse Maternal Uncle     Social History Social History   Tobacco Use  . Smoking status: Current Every Day Smoker    Packs/day: 0.50    Types: Cigarettes    Last attempt to quit: 08/29/2016    Years since quitting: 1.3  . Smokeless tobacco: Never Used  . Tobacco comment: patient is vaping and has cut back to 5 a week.   Substance Use Topics  .  Alcohol use: No  . Drug use: Not Currently    Types: Marijuana     Allergies   Patient has no known allergies.   Review of Systems Review of Systems  Constitutional: Negative for fever.  Respiratory: Negative for shortness of breath.   Gastrointestinal: Negative for abdominal pain.  Musculoskeletal: Positive for back pain.  Neurological: Negative for numbness.     Physical Exam Updated Vital Signs BP 140/89 (BP Location: Left Arm)   Pulse 92   Temp 98.6 F (37 C) (Oral)   Resp 14   Ht 5\' 7"  (1.702 m)   Wt 103 kg (227 lb)   LMP 10/12/2011   SpO2 98%   BMI 35.55 kg/m   Physical Exam  Constitutional: She appears well-developed and well-nourished. No distress.  HENT:  Head: Atraumatic.  Eyes: Conjunctivae are normal.  Neck: Neck supple.  Musculoskeletal: She exhibits tenderness (Tenderness to right upper back on palpation without any significant midline spine tenderness no overlying skin changes, no rash.  No crepitus or emphysema.).  Neurological: She is alert.  Skin: No rash noted.  Psychiatric: She has a normal mood and affect.  Nursing note and vitals reviewed.    ED Treatments / Results  Labs (all labs ordered are listed, but only abnormal results are displayed) Labs Reviewed - No data to display  EKG None  Radiology No results found.  Procedures Procedures (including critical care time)  Medications Ordered in ED Medications  HYDROmorphone (DILAUDID) injection 1 mg (1 mg Intramuscular Given 01/20/18 0932)     Initial Impression / Assessment and Plan / ED Course  I have reviewed the triage vital signs and the nursing notes.  Pertinent labs & imaging results that were available during my care of the patient were reviewed by me and considered in my medical decision making (see chart for details).     BP 140/89 (BP Location: Left Arm)   Pulse 92   Temp 98.6 F (37 C) (Oral)   Resp 14   Ht 5\' 7"  (1.702 m)   Wt 103 kg (227 lb)   LMP  10/12/2011   SpO2 98%   BMI 35.55 kg/m    Final Clinical Impressions(s) / ED Diagnoses   Final diagnoses:  Upper back pain on right side    ED Discharge Orders        Ordered    cyclobenzaprine (FLEXERIL) 10 MG tablet  3 times daily PRN     01/20/18 1030    predniSONE (DELTASONE) 20 MG tablet     01/20/18 1030     9:32 AM Patient here with recent producible right upper back pain likely musculoskeletal in origin.  No pain to her midline spine.  No rash.  Pain is atypical for  cardiopulmonary disease.  She is requesting for pain treatments which she received the past and it helps.  She is scheduled to be seen by her PCP in 2 days.  Therefore, I will give patient pain management here, and also short course of steroid to go home.  Return precautions discussed.   Fayrene Helper, PA-C 01/20/18 1031    Samuel Jester, DO 01/24/18 1333

## 2018-01-20 NOTE — ED Triage Notes (Signed)
Patient c/o right mid back pain x 2 weeks. Patient states progressively getting worse. Patient states she has an appointment in 2 days with her PCP.

## 2018-01-22 DIAGNOSIS — M62838 Other muscle spasm: Secondary | ICD-10-CM | POA: Diagnosis not present

## 2018-03-25 ENCOUNTER — Ambulatory Visit (INDEPENDENT_AMBULATORY_CARE_PROVIDER_SITE_OTHER): Payer: BLUE CROSS/BLUE SHIELD | Admitting: Psychiatry

## 2018-03-25 ENCOUNTER — Encounter (HOSPITAL_COMMUNITY): Payer: Self-pay | Admitting: Psychiatry

## 2018-03-25 VITALS — BP 138/93 | HR 92 | Ht 67.0 in | Wt 223.0 lb

## 2018-03-25 DIAGNOSIS — Z79899 Other long term (current) drug therapy: Secondary | ICD-10-CM

## 2018-03-25 DIAGNOSIS — F319 Bipolar disorder, unspecified: Secondary | ICD-10-CM

## 2018-03-25 DIAGNOSIS — Z813 Family history of other psychoactive substance abuse and dependence: Secondary | ICD-10-CM

## 2018-03-25 DIAGNOSIS — Z811 Family history of alcohol abuse and dependence: Secondary | ICD-10-CM

## 2018-03-25 DIAGNOSIS — F17210 Nicotine dependence, cigarettes, uncomplicated: Secondary | ICD-10-CM

## 2018-03-25 DIAGNOSIS — F4310 Post-traumatic stress disorder, unspecified: Secondary | ICD-10-CM

## 2018-03-25 DIAGNOSIS — F411 Generalized anxiety disorder: Secondary | ICD-10-CM | POA: Diagnosis not present

## 2018-03-25 MED ORDER — ALPRAZOLAM ER 2 MG PO TB24: 2.0000 mg | ORAL_TABLET | ORAL | 1 refills | Status: DC

## 2018-03-25 NOTE — Patient Instructions (Signed)
[email or web address] [address] [place],  [address] Phone - [phone] Fax - [phone] 

## 2018-03-25 NOTE — Progress Notes (Signed)
[facility] MD/PA/NP OP Progress Note  03/25/2018 10:40 AM Jill [name]  MRN:  [id number]  Chief Complaint: med check  HPI: Jill [name] reports she got fired last Thursday because of missing too many days from back pain.  She had a doctor's note, so she is frustrated that this happened, but she is praying and applying for multiple jobs.  She reports that she is never not worked in over 30 years so this is uncomfortable for her.  She tends to be bored at home.  She is sleeping well and reports that her mood has been pretty good despite the change.  She denies any other acute concerns or safety issues and feels very stable on the combination of Xanax extended release and Seroquel.  We will follow-up in 3 months or sooner if needed.  Visit Diagnosis:    ICD-10-CM   1. Bipolar I disorder (HCC) F31.9   2. Generalized anxiety disorder F41.1 ALPRAZolam (ALPRAZOLAM XR) 2 MG 24 hr tablet  3. PTSD (post-traumatic stress disorder) F43.10      Past Psychiatric History: See intake H&P for full details. Reviewed, with no updates at this time.  Past Medical History:  Past Medical History:  Diagnosis Date  . Anxiety   . Bipolar 1 disorder (HCC)   . Hypertension   . Post-traumatic stress syndrome     Past Surgical History:  Procedure Laterality Date  . BLADDER SURGERY    . CHOLECYSTECTOMY    . TUBAL LIGATION      Family Psychiatric History: See intake H&P for full details. Reviewed, with no updates at this time.   Family History:  Family History  Problem Relation Age of Onset  . Drug abuse Mother   . Alcohol abuse Maternal Uncle   . Drug abuse Maternal Uncle     Social History:  Social History   Socioeconomic History  . Marital status: Single    Spouse name: Not on file  . Number of children: 2  . Years of education: 67  . Highest education level: Not on file  Occupational History  . Not on file  Social Needs  . Financial resource strain: Not on file  . Food insecurity:   Worry: Not on file    Inability: Not on file  . Transportation needs:    Medical: Not on file    Non-medical: Not on file  Tobacco Use  . Smoking status: Current Every Day Smoker    Packs/day: 0.50    Types: Cigarettes    Last attempt to quit: 08/29/2016    Years since quitting: 1.5  . Smokeless tobacco: Never Used  . Tobacco comment: patient is vaping and has cut back to 5 a week.   Substance and Sexual Activity  . Alcohol use: No  . Drug use: Not Currently    Types: Marijuana  . Sexual activity: Never    Birth control/protection: Surgical  Lifestyle  . Physical activity:    Days per week: Not on file    Minutes per session: Not on file  . Stress: Not on file  Relationships  . Social connections:    Talks on phone: Not on file    Gets together: Not on file    Attends religious service: Not on file    Active member of club or organization: Not on file    Attends meetings of clubs or organizations: Not on file    Relationship status: Not on file  Other Topics Concern  . Not  on file  Social History Narrative   Pt lives with her son in [place]. 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 Born and raised in [place] by mom until 7 then by grandparents. Pt has one older brother. 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     Allergies: No Known Allergies  Metabolic Disorder Labs: No results found for: HGBA1C, MPG No results found for: PROLACTIN No results found for: CHOL, TRIG, HDL, CHOLHDL, VLDL, LDLCALC No results found for: TSH  Therapeutic Level Labs: No results found for: LITHIUM No results found for: VALPROATE No components found for:  CBMZ  Current Medications: Current Outpatient Medications  Medication Sig Dispense Refill  . ALPRAZolam (ALPRAZOLAM XR) 2 MG 24 hr tablet Take 1 tablet (2 mg total) by mouth every morning. 30 tablet 1  . cholecalciferol (VITAMIN D) 400 UNITS TABS tablet Take 1,200 Units by mouth 2 (two) times a  week. Reported on 11/09/2015    . cyclobenzaprine (FLEXERIL) 10 MG tablet Take 1 tablet (10 mg total) by mouth 3 (three) times daily as needed for muscle spasms. 12 tablet 0  . hydrochlorothiazide (HYDRODIURIL) 25 MG tablet Take 25 mg by mouth daily. Reported on 11/09/2015    . ibuprofen (ADVIL,MOTRIN) 200 MG tablet Take 400 mg by mouth every 6 (six) hours as needed for headache.     . predniSONE (DELTASONE) 20 MG tablet Take 40 mg by mouth daily for 3 days, then 20mg  by mouth daily for 3 days, then 10mg  daily for 3 days 12 tablet 0  . QUEtiapine (SEROQUEL) 400 MG tablet Take 1 tablet (400 mg total) by mouth at bedtime. 90 tablet 1  . acetaminophen (TYLENOL) 500 MG tablet Take 2 tablets (1,000 mg total) by mouth every 6 (six) hours as needed for moderate pain. (Patient not taking: Reported on 03/25/2018) 30 tablet 0  . methocarbamol (ROBAXIN) 500 MG tablet Take 1 tablet (500 mg total) by mouth 2 (two) times daily. (Patient not taking: Reported on 03/25/2018) 20 tablet 0  . metoprolol (LOPRESSOR) 50 MG tablet Take 50 mg by mouth 2 (two) times daily.    . traMADol (ULTRAM) 50 MG tablet Take 1 tablet (50 mg total) by mouth every 6 (six) hours as needed. (Patient not taking: Reported on 03/25/2018) 8 tablet 0   No current facility-administered medications for this visit.     Musculoskeletal: Strength & Muscle Tone: within normal limits Gait & Station: normal Patient leans: N/A  Psychiatric Specialty Exam: ROS  Blood pressure (!) 138/93, pulse 92, height 5\' 7"  (1.702 m), weight 223 lb (101.2 kg), last menstrual period 10/12/2011, SpO2 98 %.Body mass index is 34.93 kg/m.  General Appearance: Casual and Well Groomed  Eye Contact:  Good  Speech:  Clear and Coherent and Normal Rate  Volume:  Normal  Mood:  Anxious, Euthymic and just focused on getting a job  Affect:  Appropriate and Congruent  Thought Process:  Goal Directed and Descriptions of Associations: Intact  Orientation:  Full (Time, Place,  and Person)  Thought Content: Logical   Suicidal Thoughts:  No  Homicidal Thoughts:  No  Memory:  Immediate;   Good  Judgement:  Fair  Insight:  Fair  Psychomotor Activity:  Normal  Concentration:  Concentration: Good  Recall:  Good  Fund of Knowledge: Good  Language: Good  Akathisia:  Negative  Handed:  Right  AIMS (if indicated): done  Assets:  Communication Skills Desire  for Improvement Financial Resources/Insurance Housing Social Support Talents/Skills Transportation Vocational/Educational  ADL's:  Intact  Cognition: WNL  Sleep:  Good   Screenings:   Assessment and Plan:  Lenon Curthonda C Frysinger presents with generally good mood stability, particularly given that she was recently fired from her job last week.  She seems to be handling things pretty well and has applied to over 10 jobs already and reports that she is using her faith and resilience to move forward with finding a new position.  No acute safety concerns and not abusing alcohol or other substances.  We will follow-up in 3 months or sooner if needed.  1. Bipolar I disorder (HCC)   2. Generalized anxiety disorder   3. PTSD (post-traumatic stress disorder)     Status of current problems: stable  Labs Ordered: No orders of the defined types were placed in this encounter.   Labs Reviewed: n/a  Collateral Obtained/Records Reviewed: n/a  Plan:  Continue Seroquel 400 mg nightly Continue Xanax extended release 2 mg every morning Return to clinic in 3 months   Burnard LeighAlexander Arya Kushal Saunders, MD 03/25/2018, 10:40 AM

## 2018-05-06 ENCOUNTER — Emergency Department (HOSPITAL_COMMUNITY): Payer: BLUE CROSS/BLUE SHIELD

## 2018-05-06 ENCOUNTER — Emergency Department (HOSPITAL_COMMUNITY)
Admission: EM | Admit: 2018-05-06 | Discharge: 2018-05-06 | Disposition: A | Discharge status: Home / Self Care | Payer: BLUE CROSS/BLUE SHIELD | Attending: Emergency Medicine | Admitting: Emergency Medicine

## 2018-05-06 ENCOUNTER — Encounter (HOSPITAL_COMMUNITY): Payer: Self-pay | Admitting: Emergency Medicine

## 2018-05-06 ENCOUNTER — Other Ambulatory Visit: Payer: Self-pay

## 2018-05-06 DIAGNOSIS — Z79899 Other long term (current) drug therapy: Secondary | ICD-10-CM | POA: Insufficient documentation

## 2018-05-06 DIAGNOSIS — Y990 Civilian activity done for income or pay: Secondary | ICD-10-CM | POA: Diagnosis not present

## 2018-05-06 DIAGNOSIS — I10 Essential (primary) hypertension: Secondary | ICD-10-CM | POA: Insufficient documentation

## 2018-05-06 DIAGNOSIS — W231XXA Caught, crushed, jammed, or pinched between stationary objects, initial encounter: Secondary | ICD-10-CM | POA: Diagnosis not present

## 2018-05-06 DIAGNOSIS — F17210 Nicotine dependence, cigarettes, uncomplicated: Secondary | ICD-10-CM | POA: Insufficient documentation

## 2018-05-06 DIAGNOSIS — S62666A Nondisplaced fracture of distal phalanx of right little finger, initial encounter for closed fracture: Secondary | ICD-10-CM | POA: Insufficient documentation

## 2018-05-06 DIAGNOSIS — M79644 Pain in right finger(s): Secondary | ICD-10-CM

## 2018-05-06 DIAGNOSIS — Y9289 Other specified places as the place of occurrence of the external cause: Secondary | ICD-10-CM | POA: Diagnosis not present

## 2018-05-06 DIAGNOSIS — S6991XA Unspecified injury of right wrist, hand and finger(s), initial encounter: Secondary | ICD-10-CM | POA: Diagnosis not present

## 2018-05-06 DIAGNOSIS — Y9389 Activity, other specified: Secondary | ICD-10-CM | POA: Insufficient documentation

## 2018-05-06 MED ORDER — DICLOFENAC SODIUM 1 % TD GEL
2.0000 g | Freq: Once | TRANSDERMAL | Status: AC
  Administered 2018-05-06: 16:00:00 2 g via TOPICAL
  Filled 2018-05-06: qty 100

## 2018-05-06 MED ORDER — DICLOFENAC SODIUM 1 % TD GEL: 2.0000 g | Freq: Four times a day (QID) | TRANSDERMAL | 0 refills | Status: DC

## 2018-05-06 NOTE — Discharge Instructions (Signed)
You have been seen today for a finger injury.  There was a small fracture noted on x-ray. Antiinflammatory medications: Take 600 mg of ibuprofen every 6 hours or 440 mg (over the counter dose) to 500 mg (prescription dose) of naproxen every 12 hours for the next 3 days. After this time, these medications may be used as needed for pain. Take these medications with food to avoid upset stomach. Choose only one of these medications, do not take them together. Acetaminophen (generic for Tylenol): Should you continue to have additional pain while taking the ibuprofen or naproxen, you may add in acetaminophen as needed. Your daily total maximum amount of acetaminophen from all sources should be limited to 4000mg /day for persons without liver problems, or 2000mg /day for those with liver problems. Diclofenac gel: May apply the diclofenac gel, as needed, for pain. Ice: May apply ice to the area over the next 24 hours for 15 minutes at a time to reduce swelling. Elevation: Keep the extremity elevated as often as possible to reduce pain and inflammation. Support: Wear the finger splint for support and comfort. Wear this until pain resolves.  Follow up: Follow-up with the hand specialist for any further management of this issue. Return: Return to the ED for numbness, weakness, increasing pain, overall worsening symptoms, loss of function, or if symptoms are not improving, you have tried to follow up with the orthopedic specialist, and have been unable to do so.  For prescription assistance, may try using prescription discount sites or apps, such as goodrx.com

## 2018-05-06 NOTE — ED Notes (Signed)
Pt reports that she did not take BP medication today and will take when she gets home.

## 2018-05-06 NOTE — ED Triage Notes (Addendum)
Concern for right pinky breaking last week when hit up against something; pain with touch.

## 2018-05-06 NOTE — ED Notes (Signed)
Pt is aware of elevated BP. States that she has not taken BP meds today, but will manage when she gets home. RN has been notified

## 2018-05-06 NOTE — ED Provider Notes (Signed)
[facility]-EMERGENCY DEPT Provider Note   CSN: [id number] Arrival date & time: 05/06/18  1400     History   Chief Complaint Chief Complaint  Patient presents with  . Hand Pain    HPI Jill [name] is a 52 y.o. female.  HPI   Jill [name] is a 52 y.o. female, with a history of anxiety, HTN, and bipolar, presenting to the ED with an injury to the right small finger that occurred a week ago.  States she was moving boxes at her job, reached out toward a box, and struck the tip of the small right finger, transferring force along the length of the finger towards the hand, "jamming" the finger.  She endorses pain and swelling to the right small finger that has been persistent.  She has been taking ibuprofen.  Denies numbness, weakness, hand pain, other injuries, or any other complaints.    Past Medical History:  Diagnosis Date  . Anxiety   . Bipolar 1 disorder (HCC)   . Hypertension   . Post-traumatic stress syndrome     Patient Active Problem List   Diagnosis Date Noted  . Alcohol abuse 11/09/2015  . Insomnia 11/09/2015  . PTSD (post-traumatic stress disorder) 11/09/2015  . GAD (generalized anxiety disorder) 11/09/2015  . 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 BLADDER SURGERY    . CHOLECYSTECTOMY    . TUBAL LIGATION       OB History   None      Home Medications    Prior to Admission medications   Medication Sig Start Date End Date Taking? Authorizing Provider  acetaminophen (TYLENOL) 500 MG tablet Take 2 tablets (1,000 mg total) by mouth every 6 (six) hours as needed for moderate pain. Patient not taking: Reported on 03/25/2018 08/25/17   Mathews Robinsons B, PA-C  ALPRAZolam (ALPRAZOLAM XR) 2 MG 24 hr tablet Take 1 tablet (2 mg total) by mouth every morning. 03/25/18   Eksir, Bo Mcclintock, MD  cholecalciferol (VITAMIN D)  400 UNITS TABS tablet Take 1,200 Units by mouth 2 (two) times a week. Reported on 11/09/2015    [provider]  cyclobenzaprine (FLEXERIL) 10 MG tablet Take 1 tablet (10 mg total) by mouth 3 (three) times daily as needed for muscle spasms. 01/20/18   Fayrene Helper, PA-C  diclofenac sodium (VOLTAREN) 1 % GEL Apply 2 g topically 4 (four) times daily. 05/06/18   Jabir Dahlem C, PA-C  hydrochlorothiazide (HYDRODIURIL) 25 MG tablet Take 25 mg by mouth daily. Reported on 11/09/2015    [provider]  ibuprofen (ADVIL,MOTRIN) 200 MG tablet Take 400 mg by mouth every 6 (six) hours as needed for headache.     [provider]  methocarbamol (ROBAXIN) 500 MG tablet Take 1 tablet (500 mg total) by mouth 2 (two) times daily. Patient not taking: Reported on 03/25/2018 01/16/18   Garlon Hatchet, PA-C  metoprolol (LOPRESSOR) 50 MG tablet Take 50 mg by mouth 2 (two) times daily.    [provider]  predniSONE (DELTASONE) 20 MG tablet Take 40 mg by mouth daily for 3 days, then 20mg  by mouth daily for 3 days, then 10mg  daily for 3 days 01/20/18   Fayrene Helper, PA-C  QUEtiapine (SEROQUEL) 400 MG tablet Take 1 tablet (400 mg total) by mouth at bedtime. 12/23/17   Burnard Leigh, MD  traMADol Janean Sark) 50 MG tablet  Take 1 tablet (50 mg total) by mouth every 6 (six) hours as needed. Patient not taking: Reported on 03/25/2018 01/05/18   Raeford Razor, MD    Family History Family History  Problem Relation Age of Onset  . Drug abuse Mother   . Alcohol abuse Maternal Uncle   . Drug abuse Maternal Uncle     Social History Social History   Tobacco Use  . Smoking status: Current Every Day Smoker    Packs/day: 0.50    Types: Cigarettes    Last attempt to quit: 08/29/2016    Years since quitting: 1.6  . Smokeless tobacco: Never Used  . Tobacco comment: patient is vaping and has cut back to 5 a week.   Substance Use Topics  . Alcohol use: No  . Drug use: Not Currently    Types:  Marijuana     Allergies   Patient has no known allergies.   Review of Systems Review of Systems  Musculoskeletal: Positive for arthralgias.  Neurological: Negative for weakness and numbness.     Physical Exam Updated Vital Signs BP (!) 135/105 (BP Location: Left Arm)   Pulse (!) 103   Temp 99 F (37.2 C) (Oral)   Resp 15   Ht 5\' 7"  (1.702 m)   Wt 102.5 kg   LMP 10/12/2011   SpO2 98%   BMI 35.40 kg/m   Physical Exam  Constitutional: She appears well-developed and well-nourished. No distress.  HENT:  Head: Normocephalic and atraumatic.  Eyes: Conjunctivae are normal.  Neck: Neck supple.  Cardiovascular: Normal rate, regular rhythm and intact distal pulses.  Pulmonary/Chest: Effort normal.  Musculoskeletal: She exhibits edema and tenderness. She exhibits no deformity.  Tenderness and swelling to the right small finger, especially in the region of the middle phalanx.  No deformity noted.  No tenderness, swelling, or other abnormality to the proximal phalanx or into the hand.  Neurological: She is alert.  Sensation to light touch grossly intact in the right small finger.  Flexion and extension intact against resistance in the DIP, PIP, and MCP joints of the small finger.  Skin: Skin is warm and dry. Capillary refill takes less than 2 seconds. She is not diaphoretic. No pallor.  Psychiatric: She has a normal mood and affect. Her behavior is normal.  Nursing note and vitals reviewed.    ED Treatments / Results  Labs (all labs ordered are listed, but only abnormal results are displayed) Labs Reviewed - No data to display  EKG None  Radiology Dg Hand Complete Right  Result Date: 05/06/2018 CLINICAL DATA:  Hit hand on something last week. Pain with concern for fracture of the small finger. Initial encounter. EXAM: RIGHT HAND - COMPLETE 3+ VIEW COMPARISON:  Right wrist radiographs 09/02/2004 FINDINGS: There is an oblique, nondisplaced, intra-articular fracture involving  the base of the distal phalanx of the small finger. There is at most mild overlying soft tissue swelling. There is no dislocation. IMPRESSION: Nondisplaced, intra-articular fracture of the distal phalanx of the small finger. Electronically Signed   By: Sebastian Ache M.D.   On: 05/06/2018 16:55    Procedures Procedures (including critical care time)  Medications Ordered in ED Medications  diclofenac sodium (VOLTAREN) 1 % transdermal gel 2 g (2 g Topical Given 05/06/18 1621)     Initial Impression / Assessment and Plan / ED Course  I have reviewed the triage vital signs and the nursing notes.  Pertinent labs & imaging results that were available during my care  of the patient were reviewed by me and considered in my medical decision making (see chart for details).  Clinical Course as of May 06 1736  Wed May 06, 2018  1730 Spoke with Dr. Melvyn Novas. He will see the patient in the office.    [SJ]    Clinical Course User Index [SJ] Ivanna Kocak C, PA-C    Patient presents with a right small finger injury.  Nondisplaced, intra-articular fracture noted on x-ray.  Splinted.  Hand surgery follow-up. The patient was given instructions for home care as well as return precautions. Patient voices understanding of these instructions, accepts the plan, and is comfortable with discharge.  Findings and plan of care discussed with Kristine Royal, MD.    Final Clinical Impressions(s) / ED Diagnoses   Final diagnoses:  Finger pain, right  Nondisplaced fracture of distal phalanx of right little finger, initial encounter for closed fracture    ED Discharge Orders         Ordered    diclofenac sodium (VOLTAREN) 1 % GEL  4 times daily     05/06/18 1523           Anselm Pancoast, PA-C 05/06/18 1738    Wynetta Fines, MD 05/07/18 1554

## 2018-08-03 DIAGNOSIS — F3181 Bipolar II disorder: Secondary | ICD-10-CM | POA: Diagnosis not present

## 2018-08-31 ENCOUNTER — Ambulatory Visit (HOSPITAL_BASED_OUTPATIENT_CLINIC_OR_DEPARTMENT_OTHER): Payer: BLUE CROSS/BLUE SHIELD | Attending: Nurse Practitioner | Admitting: Internal Medicine

## 2018-08-31 VITALS — Ht 67.0 in | Wt 240.0 lb

## 2018-08-31 DIAGNOSIS — R0683 Snoring: Secondary | ICD-10-CM

## 2018-08-31 DIAGNOSIS — G4730 Sleep apnea, unspecified: Secondary | ICD-10-CM

## 2018-08-31 DIAGNOSIS — G4733 Obstructive sleep apnea (adult) (pediatric): Secondary | ICD-10-CM | POA: Insufficient documentation

## 2018-08-31 DIAGNOSIS — G4700 Insomnia, unspecified: Secondary | ICD-10-CM

## 2018-09-05 DIAGNOSIS — G4730 Sleep apnea, unspecified: Secondary | ICD-10-CM | POA: Diagnosis not present

## 2018-09-05 DIAGNOSIS — G4700 Insomnia, unspecified: Secondary | ICD-10-CM | POA: Diagnosis not present

## 2018-09-05 NOTE — Procedures (Signed)
   Patient Name: Jill [name], Jill [name] Date: 09/01/2018 Gender: Female D.O.B: 10-20-1965 Age (years): 52 Referring Provider: Angus Palms Height (inches): 67 Interpreting Physician: Jetty Duhamel MD, ABSM Weight (lbs): 240 RPSGT: Vidalia Sink BMI: 38 MRN: [id number] Neck Size: 15.50  CLINICAL INFORMATION Sleep Study Type: HST Indication for sleep study: OSA, Snoring Epworth Sleepiness Score: 7  SLEEP STUDY TECHNIQUE A multi-channel overnight portable sleep study was performed. The channels recorded were: nasal airflow, thoracic respiratory movement, and oxygen saturation with a pulse oximetry. Snoring was also monitored.  MEDICATIONS Patient self administered medications include: none reported.  SLEEP ARCHITECTURE Patient was studied for 367 minutes. The sleep efficiency was 100.0 % and the patient was supine for 0%. The arousal index was 0.0 per hour.  RESPIRATORY PARAMETERS The overall AHI was 5.2 per hour, with a central apnea index of 0.0 per hour. The oxygen nadir was 78% during sleep.  CARDIAC DATA Mean heart rate during sleep was 101.4 bpm.  IMPRESSIONS - Mild obstructive sleep apnea occurred during this study (AHI = 5.2/h). Upper limit of normal is AHI 5.0/hr. - No significant central sleep apnea occurred during this study (CAI = 0.0/h). - Oxygen desaturation was noted during this study (Min O2 = 78%). Mean 95%. - Patient snored.  DIAGNOSIS - Obstructive Sleep Apnea (327.23 [G47.33 ICD-10])  RECOMMENDATIONS - Treatment for minimal OSA is directed at symptoms. Conservative measures may include observation, weight loss and sleep position off back.  - Other options might include CPAP, a fitted oral appliance, ENT evaluation, based on clinical judgment. - Be careful with alcohol, sedatives and other CNS depressants that may worsen sleep apnea and disrupt normal sleep architecture. - Sleep hygiene should be reviewed to assess factors that may improve sleep  quality. - Weight management and regular exercise should be initiated or continued.  [Electronically signed] 09/05/2018 10:46 AM  Jetty Duhamel MD, ABSM Diplomate, American Board of Sleep Medicine   NPI: [id number]                          Jetty Duhamel Diplomate, American Board of Sleep Medicine  ELECTRONICALLY SIGNED ON:  09/05/2018, 10:37 AM [facility] PH: [phone]   FX: [phone] ACCREDITED BY THE AMERICAN ACADEMY OF SLEEP MEDICINE

## 2018-10-12 ENCOUNTER — Other Ambulatory Visit: Payer: Self-pay

## 2018-10-12 ENCOUNTER — Emergency Department (HOSPITAL_COMMUNITY)
Admission: EM | Admit: 2018-10-12 | Discharge: 2018-10-12 | Disposition: A | Discharge status: Home / Self Care | Payer: BLUE CROSS/BLUE SHIELD | Attending: Emergency Medicine | Admitting: Emergency Medicine

## 2018-10-12 ENCOUNTER — Encounter (HOSPITAL_COMMUNITY): Payer: Self-pay | Admitting: Emergency Medicine

## 2018-10-12 DIAGNOSIS — Z79899 Other long term (current) drug therapy: Secondary | ICD-10-CM | POA: Insufficient documentation

## 2018-10-12 DIAGNOSIS — Z760 Encounter for issue of repeat prescription: Secondary | ICD-10-CM | POA: Diagnosis not present

## 2018-10-12 DIAGNOSIS — I10 Essential (primary) hypertension: Secondary | ICD-10-CM | POA: Insufficient documentation

## 2018-10-12 DIAGNOSIS — F411 Generalized anxiety disorder: Secondary | ICD-10-CM

## 2018-10-12 DIAGNOSIS — F17210 Nicotine dependence, cigarettes, uncomplicated: Secondary | ICD-10-CM | POA: Diagnosis not present

## 2018-10-12 MED ORDER — ALPRAZOLAM ER 2 MG PO TB24: 2.0000 mg | ORAL_TABLET | ORAL | 0 refills | Status: AC

## 2018-10-12 NOTE — Discharge Instructions (Signed)
You were seen in the ER for refill on your alprazolam.  I reviewed the narcotic database of [place] and you have only received this prescription for 1 provider in the past.  Given your current situation and pending appointment with a psychiatrist, I will give you 1 prescription for 3-day supply of your medicine.  We discussed this is a controlled substance and further refills need to be done by an long-term provider.  We will be unable to refill this medicine in the ER in the future.

## 2018-10-12 NOTE — ED Provider Notes (Addendum)
[facility] EMERGENCY DEPARTMENT Provider Note   CSN: [id number] Arrival date & time: 10/12/18  1548    History   Chief Complaint Chief Complaint  Patient presents with  . Medication Refill    HPI Jill [name] is a 53 y.o. female with history of generalized anxiety disorder, PTSD, bipolar disorder is here for medication refill.  States currently she is switching psychiatrists and has an appointment on 3/26.  She called her previous psychiatrist Dr. Rene Kocher for refill on her alprazolam but he recommended she follow-up with an appointment.  States that she has a co-pay of $50 with Dr. Rene Kocher and cannot afford this right now.  Has been compliant with all her other medications including BuSpar and Seroquel.  Takes 2 mg alprazolam a day, every morning and at nighttime.  Has been taking this dose for several years.  She ran out yesterday.  Last seen by Dr. Rene Kocher August 2019.  Reports her anxiety is at her baseline, she is afraid this will worsen if she does not take her medicine.  No other complaints today.     HPI  Past Medical History:  Diagnosis Date  . Anxiety   . Bipolar 1 disorder (HCC)   . Hypertension   . Post-traumatic stress syndrome     Patient Active Problem List   Diagnosis Date Noted  . Alcohol abuse 11/09/2015  . Insomnia 11/09/2015  . PTSD (post-traumatic stress disorder) 11/09/2015  . GAD (generalized anxiety disorder) 11/09/2015  . 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 BLADDER SURGERY    . CHOLECYSTECTOMY    . TUBAL LIGATION       OB History   No obstetric history on file.      Home Medications    Prior to Admission medications   Medication Sig Start Date End Date Taking? Authorizing Provider  acetaminophen (TYLENOL) 500 MG tablet Take 2 tablets (1,000 mg total) by mouth every 6 (six) hours as needed for moderate pain.  Patient not taking: Reported on 03/25/2018 08/25/17   Mathews Robinsons B, PA-C  ALPRAZolam (ALPRAZOLAM XR) 2 MG 24 hr tablet Take 1 tablet (2 mg total) by mouth every morning for 3 days. 10/12/18 10/15/18  Liberty Handy, PA-C  cholecalciferol (VITAMIN D) 400 UNITS TABS tablet Take 1,200 Units by mouth 2 (two) times a week. Reported on 11/09/2015    [provider]  cyclobenzaprine (FLEXERIL) 10 MG tablet Take 1 tablet (10 mg total) by mouth 3 (three) times daily as needed for muscle spasms. 01/20/18   Fayrene Helper, PA-C  diclofenac sodium (VOLTAREN) 1 % GEL Apply 2 g topically 4 (four) times daily. 05/06/18   Joy, Shawn C, PA-C  hydrochlorothiazide (HYDRODIURIL) 25 MG tablet Take 25 mg by mouth daily. Reported on 11/09/2015    [provider]  ibuprofen (ADVIL,MOTRIN) 200 MG tablet Take 400 mg by mouth every 6 (six) hours as needed for headache.     [provider]  methocarbamol (ROBAXIN) 500 MG tablet Take 1 tablet (500 mg total) by mouth 2 (two) times daily. Patient not taking: Reported on 03/25/2018 01/16/18   Garlon Hatchet, PA-C  metoprolol (LOPRESSOR) 50 MG tablet Take 50 mg by mouth 2 (two) times daily.    [provider]  predniSONE (DELTASONE) 20 MG tablet Take 40 mg by mouth daily for 3 days, then 20mg  by mouth daily for 3  days, then  daily for 3 days 01/20/18   Fayrene Helper, PA-C  QUEtiapine (SEROQUEL) 400 MG tablet Take 1 tablet (400 mg total) by mouth at bedtime. 12/23/17   Eksir, Bo Mcclintock, MD  traMADol (ULTRAM) 50 MG tablet Take 1 tablet (50 mg total) by mouth every 6 (six) hours as needed. Patient not taking: Reported on 03/25/2018 01/05/18   Raeford Razor, MD    Family History Family History  Problem Relation Age of Onset  . Drug abuse Mother   . Alcohol abuse Maternal Uncle   . Drug abuse Maternal Uncle     Social History Social History   Tobacco Use  . Smoking status: Current Every Day Smoker    Packs/day: 0.50    Types:  Cigarettes    Last attempt to quit: 08/29/2016    Years since quitting: 2.1  . Smokeless tobacco: Never Used  . Tobacco comment: patient is vaping and has cut back to 5 a week.   Substance Use Topics  . Alcohol use: No  . Drug use: Not Currently    Types: Marijuana     Allergies   Patient has no known allergies.   Review of Systems Review of Systems  All other systems reviewed and are negative.    Physical Exam Updated Vital Signs BP (!) 137/91 (BP Location: Left Arm)   Pulse 95   Temp 98 F (36.7 C) (Oral)   Resp 14   LMP 10/12/2011   SpO2 97%   Physical Exam Constitutional:      Appearance: She is well-developed. She is not toxic-appearing.  HENT:     Head: Normocephalic.     Right Ear: External ear normal.     Left Ear: External ear normal.     Nose: Nose normal.  Eyes:     Conjunctiva/sclera: Conjunctivae normal.  Neck:     Musculoskeletal: Full passive range of motion without pain.  Cardiovascular:     Rate and Rhythm: Normal rate.  Pulmonary:     Effort: Pulmonary effort is normal. No tachypnea or respiratory distress.  Musculoskeletal: Normal range of motion.  Skin:    General: Skin is warm and dry.     Capillary Refill: Capillary refill takes less than 2 seconds.  Neurological:     Mental Status: She is alert and oriented to person, place, and time.  Psychiatric:        Behavior: Behavior normal.        Thought Content: Thought content normal.      ED Treatments / Results  Labs (all labs ordered are listed, but only abnormal results are displayed) Labs Reviewed - No data to display  EKG None  Radiology No results found.  Procedures Procedures (including critical care time)  Medications Ordered in ED Medications - No data to display   Initial Impression / Assessment and Plan / ED Course  I have reviewed the triage vital signs and the nursing notes.  Pertinent labs & imaging results that were available during my care of the patient  were reviewed by me and considered in my medical decision making (see chart for details).       I have reviewed patient's chart.  She provides accurate history.  She last saw Dr. Rene Kocher August 2019.  He documents patient has been compliant with her medicines and otherwise stable from a psych standpoint.  Recent change to a new psychiatrist with documented upcoming appointment on 3/26.  States she ran out of her medicines yesterday  and PMD confirms she had a refill for 30 days on 2/15 consistent with her history.  She has not been to ER for med refills. She has had only one provider prescribe her her psych medicines, long term psych MD Dr Rene Kocher.  I explained to patient that the ER is not the best place to come for medication refills especially alprazolam.  She understands and states that she has never come to the ER for this but is desperate.  At this time, I will give patient the benefit of the doubt given her chart review, PMD review and stable psych history.  She is aware that we will not continue to refill this medication in the ER.  Rx given for alprazolam for 3 days.  She denies any symptoms to suggest psych decompensation and I do not think any further work-up needs to be done today.  She understands future refills will need to be by her long-term providers. Final Clinical Impressions(s) / ED Diagnoses   Final diagnoses:  Medication refill    ED Discharge Orders         Ordered    ALPRAZolam (ALPRAZOLAM XR) 2 MG 24 hr tablet  BH-each morning     10/12/18 1836           Liberty Handy, PA-C 10/12/18 1844    Margarita Grizzle, MD 10/16/18 1515

## 2018-10-12 NOTE — ED Triage Notes (Signed)
Pt reports that she is out of her alprazolam, seroquel, and BP meds. Requesting that she can get a prescription to last her until 3/26 when she has an appointment with her PCP

## 2018-10-22 ENCOUNTER — Encounter (HOSPITAL_COMMUNITY): Payer: Self-pay | Admitting: Psychiatry

## 2018-10-22 ENCOUNTER — Ambulatory Visit (INDEPENDENT_AMBULATORY_CARE_PROVIDER_SITE_OTHER): Payer: BLUE CROSS/BLUE SHIELD | Admitting: Psychiatry

## 2018-10-22 ENCOUNTER — Other Ambulatory Visit: Payer: Self-pay

## 2018-10-22 VITALS — BP 104/75 | HR 101 | Temp 98.4°F | Resp 16 | Wt 238.4 lb

## 2018-10-22 DIAGNOSIS — F4310 Post-traumatic stress disorder, unspecified: Secondary | ICD-10-CM | POA: Diagnosis not present

## 2018-10-22 DIAGNOSIS — F3132 Bipolar disorder, current episode depressed, moderate: Secondary | ICD-10-CM | POA: Insufficient documentation

## 2018-10-22 DIAGNOSIS — F3176 Bipolar disorder, in full remission, most recent episode depressed: Secondary | ICD-10-CM | POA: Diagnosis not present

## 2018-10-22 DIAGNOSIS — F411 Generalized anxiety disorder: Secondary | ICD-10-CM | POA: Diagnosis not present

## 2018-10-22 DIAGNOSIS — F3131 Bipolar disorder, current episode depressed, mild: Secondary | ICD-10-CM | POA: Insufficient documentation

## 2018-10-22 MED ORDER — ALPRAZOLAM 2 MG PO TABS: 1.0000 mg | ORAL_TABLET | Freq: Two times a day (BID) | ORAL | 0 refills | Status: AC

## 2018-10-22 MED ORDER — PRAZOSIN HCL 1 MG PO CAPS: 1.0000 mg | ORAL_CAPSULE | Freq: Every day | ORAL | 0 refills | Status: DC

## 2018-10-22 MED ORDER — QUETIAPINE FUMARATE 300 MG PO TABS: 600.0000 mg | ORAL_TABLET | Freq: Every day | ORAL | 0 refills | Status: DC
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
## 2018-11-27 DIAGNOSIS — M79646 Pain in unspecified finger(s): Secondary | ICD-10-CM | POA: Diagnosis not present

## 2019-01-21 ENCOUNTER — Ambulatory Visit (INDEPENDENT_AMBULATORY_CARE_PROVIDER_SITE_OTHER): Payer: BLUE CROSS/BLUE SHIELD | Admitting: Psychiatry

## 2019-01-21 ENCOUNTER — Other Ambulatory Visit: Payer: Self-pay

## 2019-01-21 DIAGNOSIS — F3176 Bipolar disorder, in full remission, most recent episode depressed: Secondary | ICD-10-CM

## 2019-01-21 DIAGNOSIS — F4310 Post-traumatic stress disorder, unspecified: Secondary | ICD-10-CM | POA: Diagnosis not present

## 2019-01-21 DIAGNOSIS — F411 Generalized anxiety disorder: Secondary | ICD-10-CM | POA: Diagnosis not present

## 2019-01-21 MED ORDER — QUETIAPINE FUMARATE 300 MG PO TABS: 600.0000 mg | ORAL_TABLET | Freq: Every day | ORAL | 0 refills | Status: DC

## 2019-01-21 MED ORDER — PRAZOSIN HCL 1 MG PO CAPS: 1.0000 mg | ORAL_CAPSULE | Freq: Every day | ORAL | 0 refills | Status: DC

## 2019-01-21 MED ORDER — ALPRAZOLAM 1 MG PO TABS: 1.0000 mg | ORAL_TABLET | Freq: Three times a day (TID) | ORAL | 2 refills | Status: DC | PRN
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
## 2019-01-29 ENCOUNTER — Other Ambulatory Visit: Payer: Self-pay

## 2019-01-29 ENCOUNTER — Emergency Department (HOSPITAL_COMMUNITY)
Admission: EM | Admit: 2019-01-29 | Discharge: 2019-01-29 | Disposition: A | Discharge status: Home / Self Care | Payer: BLUE CROSS/BLUE SHIELD | Attending: Emergency Medicine | Admitting: Emergency Medicine

## 2019-01-29 ENCOUNTER — Emergency Department (HOSPITAL_COMMUNITY): Payer: BLUE CROSS/BLUE SHIELD

## 2019-01-29 ENCOUNTER — Encounter (HOSPITAL_COMMUNITY): Payer: Self-pay

## 2019-01-29 DIAGNOSIS — M62830 Muscle spasm of back: Secondary | ICD-10-CM | POA: Diagnosis not present

## 2019-01-29 DIAGNOSIS — I10 Essential (primary) hypertension: Secondary | ICD-10-CM | POA: Diagnosis not present

## 2019-01-29 DIAGNOSIS — M62838 Other muscle spasm: Secondary | ICD-10-CM | POA: Insufficient documentation

## 2019-01-29 DIAGNOSIS — Z79899 Other long term (current) drug therapy: Secondary | ICD-10-CM | POA: Insufficient documentation

## 2019-01-29 DIAGNOSIS — M1712 Unilateral primary osteoarthritis, left knee: Secondary | ICD-10-CM | POA: Diagnosis not present

## 2019-01-29 DIAGNOSIS — F17210 Nicotine dependence, cigarettes, uncomplicated: Secondary | ICD-10-CM | POA: Insufficient documentation

## 2019-01-29 DIAGNOSIS — M7989 Other specified soft tissue disorders: Secondary | ICD-10-CM | POA: Diagnosis not present

## 2019-01-29 DIAGNOSIS — M25562 Pain in left knee: Secondary | ICD-10-CM | POA: Diagnosis not present

## 2019-01-29 MED ORDER — LIDOCAINE 5 % EX PTCH
1.0000 | MEDICATED_PATCH | CUTANEOUS | Status: DC
  Administered 2019-01-29: 1 via TRANSDERMAL
  Filled 2019-01-29: qty 1

## 2019-01-29 MED ORDER — LIDOCAINE 5 % EX PTCH: 1.0000 | MEDICATED_PATCH | CUTANEOUS | 0 refills | Status: AC

## 2019-01-29 MED ORDER — CYCLOBENZAPRINE HCL 10 MG PO TABS: 10.0000 mg | ORAL_TABLET | Freq: Two times a day (BID) | ORAL | 0 refills | Status: DC | PRN
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
## 2019-02-02 ENCOUNTER — Encounter (HOSPITAL_COMMUNITY): Payer: Self-pay

## 2019-02-02 ENCOUNTER — Emergency Department (HOSPITAL_COMMUNITY)
Admission: EM | Admit: 2019-02-02 | Discharge: 2019-02-02 | Disposition: A | Discharge status: Home / Self Care | Payer: BLUE CROSS/BLUE SHIELD | Attending: Emergency Medicine | Admitting: Emergency Medicine

## 2019-02-02 ENCOUNTER — Other Ambulatory Visit: Payer: Self-pay

## 2019-02-02 DIAGNOSIS — M546 Pain in thoracic spine: Secondary | ICD-10-CM

## 2019-02-02 DIAGNOSIS — M549 Dorsalgia, unspecified: Secondary | ICD-10-CM | POA: Diagnosis not present

## 2019-02-02 DIAGNOSIS — Z79899 Other long term (current) drug therapy: Secondary | ICD-10-CM | POA: Diagnosis not present

## 2019-02-02 DIAGNOSIS — F17210 Nicotine dependence, cigarettes, uncomplicated: Secondary | ICD-10-CM | POA: Diagnosis not present

## 2019-02-02 DIAGNOSIS — I10 Essential (primary) hypertension: Secondary | ICD-10-CM | POA: Insufficient documentation

## 2019-02-02 MED ORDER — METHOCARBAMOL 500 MG PO TABS: 500.0000 mg | ORAL_TABLET | Freq: Two times a day (BID) | ORAL | 0 refills | Status: DC

## 2019-02-02 NOTE — ED Notes (Signed)
Provided patient graham crackers and Sprite at discharge.
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
## 2019-02-02 NOTE — Discharge Instructions (Signed)
Please take medication as prescribed. Please follow up with your primary care doctor if you continue having pain. Return to the ED for worsening symptoms.
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
## 2019-03-18 ENCOUNTER — Other Ambulatory Visit: Payer: Self-pay

## 2019-03-18 ENCOUNTER — Encounter (HOSPITAL_COMMUNITY): Payer: Self-pay

## 2019-03-18 ENCOUNTER — Emergency Department (HOSPITAL_COMMUNITY): Payer: BLUE CROSS/BLUE SHIELD

## 2019-03-18 ENCOUNTER — Emergency Department (HOSPITAL_COMMUNITY)
Admission: EM | Admit: 2019-03-18 | Discharge: 2019-03-18 | Disposition: A | Discharge status: Home / Self Care | Payer: BLUE CROSS/BLUE SHIELD | Attending: Emergency Medicine | Admitting: Emergency Medicine

## 2019-03-18 DIAGNOSIS — M1711 Unilateral primary osteoarthritis, right knee: Secondary | ICD-10-CM | POA: Diagnosis not present

## 2019-03-18 DIAGNOSIS — M25461 Effusion, right knee: Secondary | ICD-10-CM | POA: Diagnosis not present

## 2019-03-18 DIAGNOSIS — I10 Essential (primary) hypertension: Secondary | ICD-10-CM | POA: Diagnosis not present

## 2019-03-18 DIAGNOSIS — F17210 Nicotine dependence, cigarettes, uncomplicated: Secondary | ICD-10-CM | POA: Insufficient documentation

## 2019-03-18 DIAGNOSIS — M1990 Unspecified osteoarthritis, unspecified site: Secondary | ICD-10-CM | POA: Insufficient documentation

## 2019-03-18 DIAGNOSIS — Z79899 Other long term (current) drug therapy: Secondary | ICD-10-CM | POA: Diagnosis not present

## 2019-03-18 DIAGNOSIS — M25561 Pain in right knee: Secondary | ICD-10-CM | POA: Diagnosis not present

## 2019-03-18 MED ORDER — DICLOFENAC SODIUM 1 % TD GEL: 2.0000 g | Freq: Four times a day (QID) | TRANSDERMAL | 0 refills | Status: AC
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
## 2019-03-18 NOTE — ED Triage Notes (Signed)
Pt reports right Knee pain and swelling for 1 month. Denies any injury. Pt reports history of osteoarthritis in left knee.

## 2019-03-18 NOTE — Discharge Instructions (Signed)
Follow up with orthopedics as discussed. Apply Voltaren gel to knee for pain as prescribed.

## 2019-04-19 ENCOUNTER — Ambulatory Visit (INDEPENDENT_AMBULATORY_CARE_PROVIDER_SITE_OTHER): Payer: BLUE CROSS/BLUE SHIELD | Admitting: Psychiatry

## 2019-04-19 ENCOUNTER — Other Ambulatory Visit: Payer: Self-pay

## 2019-04-19 DIAGNOSIS — F3176 Bipolar disorder, in full remission, most recent episode depressed: Secondary | ICD-10-CM

## 2019-04-19 DIAGNOSIS — F4310 Post-traumatic stress disorder, unspecified: Secondary | ICD-10-CM

## 2019-04-19 DIAGNOSIS — F411 Generalized anxiety disorder: Secondary | ICD-10-CM

## 2019-04-19 MED ORDER — QUETIAPINE FUMARATE 300 MG PO TABS: 600.0000 mg | ORAL_TABLET | Freq: Every day | ORAL | 0 refills | Status: DC

## 2019-04-19 MED ORDER — ALPRAZOLAM 1 MG PO TABS: 1.0000 mg | ORAL_TABLET | Freq: Three times a day (TID) | ORAL | 2 refills | Status: DC | PRN

## 2019-04-19 MED ORDER — PRAZOSIN HCL 1 MG PO CAPS: 1.0000 mg | ORAL_CAPSULE | Freq: Every day | ORAL | 0 refills | Status: DC
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
## 2019-04-22 ENCOUNTER — Ambulatory Visit (HOSPITAL_COMMUNITY): Payer: BLUE CROSS/BLUE SHIELD | Admitting: Psychiatry

## 2019-07-05 ENCOUNTER — Ambulatory Visit (INDEPENDENT_AMBULATORY_CARE_PROVIDER_SITE_OTHER): Payer: BLUE CROSS/BLUE SHIELD | Admitting: Psychiatry

## 2019-07-05 ENCOUNTER — Other Ambulatory Visit: Payer: Self-pay

## 2019-07-05 DIAGNOSIS — F411 Generalized anxiety disorder: Secondary | ICD-10-CM

## 2019-07-05 DIAGNOSIS — F4310 Post-traumatic stress disorder, unspecified: Secondary | ICD-10-CM | POA: Diagnosis not present

## 2019-07-05 DIAGNOSIS — F3176 Bipolar disorder, in full remission, most recent episode depressed: Secondary | ICD-10-CM

## 2019-07-05 MED ORDER — QUETIAPINE FUMARATE 300 MG PO TABS: 600.0000 mg | ORAL_TABLET | Freq: Every day | ORAL | 0 refills | Status: DC

## 2019-07-05 MED ORDER — ALPRAZOLAM 1 MG PO TABS: 1.0000 mg | ORAL_TABLET | Freq: Three times a day (TID) | ORAL | 2 refills | Status: DC | PRN

## 2019-07-05 MED ORDER — PRAZOSIN HCL 1 MG PO CAPS: 1.0000 mg | ORAL_CAPSULE | Freq: Every day | ORAL | 0 refills | Status: DC
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
## 2019-07-08 DIAGNOSIS — F17200 Nicotine dependence, unspecified, uncomplicated: Secondary | ICD-10-CM | POA: Diagnosis not present

## 2019-07-08 DIAGNOSIS — I10 Essential (primary) hypertension: Secondary | ICD-10-CM | POA: Diagnosis not present

## 2019-07-08 DIAGNOSIS — Z1389 Encounter for screening for other disorder: Secondary | ICD-10-CM | POA: Diagnosis not present

## 2019-07-08 DIAGNOSIS — Z23 Encounter for immunization: Secondary | ICD-10-CM | POA: Diagnosis not present

## 2019-07-08 DIAGNOSIS — M170 Bilateral primary osteoarthritis of knee: Secondary | ICD-10-CM | POA: Diagnosis not present

## 2019-07-08 DIAGNOSIS — R7303 Prediabetes: Secondary | ICD-10-CM | POA: Diagnosis not present

## 2019-07-09 ENCOUNTER — Other Ambulatory Visit: Payer: Self-pay | Admitting: Internal Medicine

## 2019-07-09 DIAGNOSIS — Z1231 Encounter for screening mammogram for malignant neoplasm of breast: Secondary | ICD-10-CM

## 2019-07-19 ENCOUNTER — Ambulatory Visit (HOSPITAL_COMMUNITY): Payer: BLUE CROSS/BLUE SHIELD | Admitting: Psychiatry

## 2019-09-07 ENCOUNTER — Ambulatory Visit: Payer: BLUE CROSS/BLUE SHIELD

## 2019-09-08 ENCOUNTER — Other Ambulatory Visit (HOSPITAL_COMMUNITY): Payer: Self-pay | Admitting: Psychiatry

## 2019-09-08 DIAGNOSIS — F3176 Bipolar disorder, in full remission, most recent episode depressed: Secondary | ICD-10-CM

## 2019-09-15 ENCOUNTER — Emergency Department (HOSPITAL_COMMUNITY)
Admission: EM | Admit: 2019-09-15 | Discharge: 2019-09-15 | Disposition: A | Discharge status: Home / Self Care | Payer: 59 | Attending: Emergency Medicine | Admitting: Emergency Medicine

## 2019-09-15 ENCOUNTER — Encounter (HOSPITAL_COMMUNITY): Payer: Self-pay | Admitting: Emergency Medicine

## 2019-09-15 ENCOUNTER — Other Ambulatory Visit: Payer: Self-pay

## 2019-09-15 DIAGNOSIS — F17210 Nicotine dependence, cigarettes, uncomplicated: Secondary | ICD-10-CM | POA: Insufficient documentation

## 2019-09-15 DIAGNOSIS — I10 Essential (primary) hypertension: Secondary | ICD-10-CM | POA: Diagnosis not present

## 2019-09-15 DIAGNOSIS — Z79899 Other long term (current) drug therapy: Secondary | ICD-10-CM | POA: Diagnosis not present

## 2019-09-15 DIAGNOSIS — M549 Dorsalgia, unspecified: Secondary | ICD-10-CM

## 2019-09-15 DIAGNOSIS — M546 Pain in thoracic spine: Secondary | ICD-10-CM | POA: Insufficient documentation

## 2019-09-15 MED ORDER — CYCLOBENZAPRINE HCL 10 MG PO TABS: 10.0000 mg | ORAL_TABLET | Freq: Two times a day (BID) | ORAL | 0 refills | Status: DC | PRN

## 2019-09-15 MED ORDER — KETOROLAC TROMETHAMINE 30 MG/ML IJ SOLN
30.0000 mg | Freq: Once | INTRAMUSCULAR | Status: AC
  Administered 2019-09-15: 30 mg via INTRAMUSCULAR
  Filled 2019-09-15: qty 1

## 2019-09-15 MED ORDER — IBUPROFEN 600 MG PO TABS: 600.0000 mg | ORAL_TABLET | Freq: Four times a day (QID) | ORAL | 0 refills | Status: DC | PRN

## 2019-09-15 NOTE — ED Triage Notes (Signed)
Per pt, states mid back pain for 3 days-no trauma, injury

## 2019-09-15 NOTE — ED Provider Notes (Signed)
[facility] DEPT Provider Note   CSN: [id number] Arrival date & time: 09/15/19  1345     History Chief Complaint  Patient presents with  . Back Pain    Jill [name] is a 54 y.o. female.  The history is provided by the patient and medical records. No language interpreter was used.  Back Pain Associated symptoms: no fever and no numbness      54 year old female with history of bipolar, anxiety, depression, recurrent back pain presenting complaining of back pain.  Patient reports the past 2 days she has had persistent pain primarily to her left mid back.  Pain is described as a sharp sensation, nonradiating, 8 out of 10, worse with certain movement but also present at rest.  Pain is not adequately controlled with Tylenol ibuprofen at home.  No associated fever chills no chest pain shortness of breath no abdominal pain no dysuria hematuria or rash.  No recent heavy lifting or strenuous activity.  No lightheadedness or dizziness.  Past Medical History:  Diagnosis Date  . Anxiety   . Bipolar 1 disorder ([place])   . Hypertension   . Post-traumatic stress syndrome     Patient Active Problem List   Diagnosis Date Noted  . Bipolar 1 disorder, depressed, full remission ([place]) 10/22/2018  . Insomnia 11/09/2015  . PTSD (post-traumatic stress disorder) 11/09/2015  . GAD (generalized anxiety disorder) 11/09/2015    Past Surgical History:  Procedure Laterality Date  . BLADDER SURGERY    . CHOLECYSTECTOMY    . TUBAL LIGATION       OB History   No obstetric history on file.     Family History  Problem Relation Age of Onset  . Drug abuse Mother   . Alcohol abuse Maternal Uncle   . Drug abuse Maternal Uncle     Social History   Tobacco Use  . Smoking status: Current Every Day Smoker    Packs/day: 0.50    Types: Cigarettes    Last attempt to quit: 08/29/2016    Years since quitting: 3.0  . Smokeless tobacco: Never Used  Substance Use Topics  .  Alcohol use: No  . Drug use: Not Currently    Types: Marijuana    Home Medications Prior to Admission medications   Medication Sig Start Date End Date Taking? Authorizing Provider  acetaminophen (TYLENOL) 500 MG tablet Take 2 tablets (1,000 mg total) by mouth every 6 (six) hours as needed for moderate pain. Patient not taking: Reported on 03/25/2018 08/25/17   Avie Echevaria B, PA-C  ALPRAZolam Duanne Moron) 1 MG tablet Take 1 tablet (1 mg total) by mouth 3 (three) times daily as needed for anxiety. 07/05/19 10/03/19  Pucilowski, Marchia Bond, MD  cholecalciferol (VITAMIN D) 400 UNITS TABS tablet Take 1,200 Units by mouth 2 (two) times a week. Reported on 11/09/2015    [provider]  cyclobenzaprine (FLEXERIL) 10 MG tablet Take 1 tablet (10 mg total) by mouth 2 (two) times daily as needed for up to 20 doses for muscle spasms. 01/29/19   Curatolo, Adam, DO  diclofenac sodium (VOLTAREN) 1 % GEL Apply 2 g topically 4 (four) times daily. 03/18/19   Tacy Learn, PA-C  hydrochlorothiazide (HYDRODIURIL) 25 MG tablet Take 25 mg by mouth daily. Reported on 11/09/2015    [provider]  ibuprofen (ADVIL,MOTRIN) 200 MG tablet Take 400 mg by mouth every 6 (six) hours as needed for headache.     [provider]  methocarbamol (  ROBAXIN) 500 MG tablet Take 1 tablet (500 mg total) by mouth 2 (two) times daily. Patient not taking: Reported on 03/25/2018 01/16/18   Garlon Hatchet, PA-C  methocarbamol (ROBAXIN) 500 MG tablet Take 1 tablet (500 mg total) by mouth 2 (two) times daily. 02/02/19   Hyman Hopes, Margaux, PA-C  metoprolol (LOPRESSOR) 50 MG tablet Take 50 mg by mouth 2 (two) times daily.    [provider]  prazosin (MINIPRESS) 1 MG capsule Take 1 capsule (1 mg total) by mouth at bedtime. 07/05/19 10/03/19  Pucilowski, Roosvelt Maser, MD  predniSONE (DELTASONE) 20 MG tablet Take 40 mg by mouth daily for 3 days, then 20mg  by mouth daily for 3 days, then 10mg  daily for 3 days Patient not taking:  Reported on 10/22/2018 01/20/18   10/24/2018, PA-C  QUEtiapine (SEROQUEL) 300 MG tablet TAKE 2 TABLETS BY MOUTH AT BEDTIME 09/08/19   Pucilowski, Olgierd A, MD  traMADol (ULTRAM) 50 MG tablet Take 1 tablet (50 mg total) by mouth every 6 (six) hours as needed. Patient not taking: Reported on 03/25/2018 01/05/18   03/27/2018, MD    Allergies    Patient has no known allergies.  Review of Systems   Review of Systems  Constitutional: Negative for fever.  Musculoskeletal: Positive for back pain.  Neurological: Negative for numbness.    Physical Exam Updated Vital Signs BP (!) 165/94 (BP Location: Right Arm)   Pulse (!) 103   Temp 98.1 F (36.7 C) (Oral)   Resp 18   LMP 10/12/2011   SpO2 100%   Physical Exam Vitals and nursing note reviewed.  Constitutional:      General: She is not in acute distress.    Appearance: She is well-developed. She is obese.  HENT:     Head: Atraumatic.  Eyes:     Conjunctiva/sclera: Conjunctivae normal.  Abdominal:     Palpations: Abdomen is soft.     Tenderness: There is no abdominal tenderness.  Musculoskeletal:        General: Tenderness (Tenderness to left parathoracic thoracic spinal muscle on palpation without any overlying skin changes.  No significant midline spine tenderness.  Ambulate without difficulty.) present.     Cervical back: Neck supple.  Skin:    Findings: No rash.  Neurological:     Mental Status: She is alert and oriented to person, place, and time.  Psychiatric:        Mood and Affect: Mood normal.     ED Results / Procedures / Treatments   Labs (all labs ordered are listed, but only abnormal results are displayed) Labs Reviewed - No data to display  EKG None  Radiology No results found.  Procedures Procedures (including critical care time)  Medications Ordered in ED Medications  ketorolac (TORADOL) 30 MG/ML injection 30 mg (has no administration in time range)    ED Course  I have reviewed the triage  vital signs and the nursing notes.  Pertinent labs & imaging results that were available during my care of the patient were reviewed by me and considered in my medical decision making (see chart for details).    MDM Rules/Calculators/A&P                      BP (!) 165/94 (BP Location: Right Arm)   Pulse (!) 103   Temp 98.1 F (36.7 C) (Oral)   Resp 18   LMP 10/12/2011   SpO2 100%   Final Clinical Impression(s) /  ED Diagnoses Final diagnoses:  Mid back pain on left side    Rx / DC Orders ED Discharge Orders         Ordered    cyclobenzaprine (FLEXERIL) 10 MG tablet  2 times daily PRN     09/15/19 1451    ibuprofen (ADVIL) 600 MG tablet  Every 6 hours PRN     09/15/19 1451         2:23 PM Patient complaining of atraumatic left mid back pain for the past few days.  Pain is reproducible on exam.  No red flags.  Low suspicion for kidney stone as patient does not have any CVA tenderness and no suspicion for acute abdominal pathology.  No signs of shingles.  No midline spine tenderness.  Will provide symptomatic treatment. Suspect MSK pain.    Fayrene Helper, PA-C 09/15/19 1452    Linwood Dibbles, MD 09/16/19 [phone]

## 2019-09-27 ENCOUNTER — Other Ambulatory Visit: Payer: Self-pay

## 2019-09-27 ENCOUNTER — Telehealth (HOSPITAL_COMMUNITY): Payer: Self-pay | Admitting: *Deleted

## 2019-09-27 ENCOUNTER — Other Ambulatory Visit (HOSPITAL_COMMUNITY): Payer: Self-pay | Admitting: *Deleted

## 2019-09-27 ENCOUNTER — Ambulatory Visit (INDEPENDENT_AMBULATORY_CARE_PROVIDER_SITE_OTHER): Payer: 59 | Admitting: Psychiatry

## 2019-09-27 DIAGNOSIS — F3176 Bipolar disorder, in full remission, most recent episode depressed: Secondary | ICD-10-CM

## 2019-09-27 DIAGNOSIS — F4310 Post-traumatic stress disorder, unspecified: Secondary | ICD-10-CM | POA: Diagnosis not present

## 2019-09-27 DIAGNOSIS — F319 Bipolar disorder, unspecified: Secondary | ICD-10-CM

## 2019-09-27 MED ORDER — ALPRAZOLAM 1 MG PO TABS: 1.0000 mg | ORAL_TABLET | Freq: Three times a day (TID) | ORAL | 3 refills | Status: DC | PRN

## 2019-09-27 MED ORDER — PRAZOSIN HCL 1 MG PO CAPS: 1.0000 mg | ORAL_CAPSULE | Freq: Every day | ORAL | 3 refills | Status: DC

## 2019-09-27 MED ORDER — QUETIAPINE FUMARATE 300 MG PO TABS: 600.0000 mg | ORAL_TABLET | Freq: Every day | ORAL | 0 refills | Status: DC
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
## 2019-09-27 NOTE — Telephone Encounter (Signed)
Thank you :)

## 2019-09-27 NOTE — Telephone Encounter (Signed)
Writer spoke to pt informaing her that lab orders have been sent to [facility] @ [address] [place]. [place]. Writer instructed pt they are to be fasting. Pt verbalizes understanding and says she will go tomorrow morning.

## 2019-09-29 LAB — LIPID PANEL
Chol/HDL Ratio: 2.7 ratio (ref 0.0–4.4)
Cholesterol, Total: 137 mg/dL (ref 100–199)
HDL: 50 mg/dL (ref >39)
LDL Chol Calc (NIH): 64 mg/dL (ref 0–99)
Triglycerides: 132 mg/dL (ref 0–149)
VLDL Cholesterol Cal: 23 mg/dL (ref 5–40)

## 2019-09-29 LAB — HEMOGLOBIN A1C
Est. average glucose Bld gHb Est-mCnc: 143 mg/dL
Hgb A1c MFr Bld: 6.6 % — ABNORMAL HIGH (ref 4.8–5.6)

## 2019-10-04 ENCOUNTER — Ambulatory Visit (HOSPITAL_COMMUNITY): Payer: BLUE CROSS/BLUE SHIELD | Admitting: Psychiatry

## 2019-11-15 ENCOUNTER — Other Ambulatory Visit (HOSPITAL_COMMUNITY): Payer: Self-pay | Admitting: *Deleted

## 2019-11-15 ENCOUNTER — Telehealth (HOSPITAL_COMMUNITY): Payer: Self-pay | Admitting: *Deleted

## 2019-11-15 MED ORDER — ALPRAZOLAM 1 MG PO TABS: 1.0000 mg | ORAL_TABLET | Freq: Three times a day (TID) | ORAL | 3 refills | Status: DC | PRN

## 2019-11-15 NOTE — Telephone Encounter (Signed)
Ok. Thanks!
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
## 2019-11-15 NOTE — Telephone Encounter (Signed)
I am fine with refilling it two days earlier particularly given the circumstances.

## 2019-11-16 ENCOUNTER — Other Ambulatory Visit (HOSPITAL_COMMUNITY): Payer: Self-pay | Admitting: *Deleted

## 2019-11-16 MED ORDER — ALPRAZOLAM 1 MG PO TABS: 1.0000 mg | ORAL_TABLET | Freq: Three times a day (TID) | ORAL | 3 refills | Status: DC | PRN

## 2019-11-16 MED ORDER — ALPRAZOLAM 1 MG PO TABS: 1.0000 mg | ORAL_TABLET | Freq: Three times a day (TID) | ORAL | 3 refills | Status: AC | PRN

## 2020-01-05 ENCOUNTER — Telehealth (INDEPENDENT_AMBULATORY_CARE_PROVIDER_SITE_OTHER): Payer: 59 | Admitting: Psychiatry

## 2020-01-05 ENCOUNTER — Other Ambulatory Visit: Payer: Self-pay

## 2020-01-05 DIAGNOSIS — F411 Generalized anxiety disorder: Secondary | ICD-10-CM

## 2020-01-05 DIAGNOSIS — F4310 Post-traumatic stress disorder, unspecified: Secondary | ICD-10-CM | POA: Diagnosis not present

## 2020-01-05 DIAGNOSIS — F3176 Bipolar disorder, in full remission, most recent episode depressed: Secondary | ICD-10-CM | POA: Diagnosis not present

## 2020-01-05 MED ORDER — PRAZOSIN HCL 1 MG PO CAPS: 1.0000 mg | ORAL_CAPSULE | Freq: Every day | ORAL | 3 refills | Status: DC

## 2020-01-05 MED ORDER — METFORMIN HCL ER 750 MG PO TB24: 750.0000 mg | ORAL_TABLET | Freq: Every day | ORAL | 1 refills | Status: DC
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
## 2020-01-14 ENCOUNTER — Other Ambulatory Visit: Payer: Self-pay

## 2020-01-14 ENCOUNTER — Telehealth: Payer: Self-pay | Admitting: Radiology

## 2020-01-14 ENCOUNTER — Ambulatory Visit (INDEPENDENT_AMBULATORY_CARE_PROVIDER_SITE_OTHER): Payer: 59 | Admitting: Cardiology

## 2020-01-14 VITALS — BP 125/78 | HR 90 | Temp 96.6°F | Ht 67.0 in | Wt 257.6 lb

## 2020-01-14 DIAGNOSIS — I10 Essential (primary) hypertension: Secondary | ICD-10-CM

## 2020-01-14 DIAGNOSIS — R002 Palpitations: Secondary | ICD-10-CM

## 2020-01-14 DIAGNOSIS — Z720 Tobacco use: Secondary | ICD-10-CM

## 2020-01-14 LAB — BASIC METABOLIC PANEL
BUN/Creatinine Ratio: 13 (ref 9–23)
BUN: 12 mg/dL (ref 6–24)
CO2: 25 mmol/L (ref 20–29)
Calcium: 9.9 mg/dL (ref 8.7–10.2)
Chloride: 100 mmol/L (ref 96–106)
Creatinine, Ser: 0.91 mg/dL (ref 0.57–1.00)
GFR calc Af Amer: 82 mL/min/{1.73_m2} (ref >59)
GFR calc non Af Amer: 71 mL/min/{1.73_m2} (ref >59)
Glucose: 111 mg/dL — ABNORMAL HIGH (ref 65–99)
Potassium: 3.6 mmol/L (ref 3.5–5.2)
Sodium: 141 mmol/L (ref 134–144)

## 2020-01-14 LAB — MAGNESIUM: Magnesium: 2.1 mg/dL (ref 1.6–2.3)

## 2020-01-14 LAB — TSH: TSH: 0.643 u[IU]/mL (ref 0.450–4.500)

## 2020-01-14 NOTE — Progress Notes (Signed)
Cardiology Office Note:    Date:  01/14/2020   ID:  MALLOREY ODONELL, DOB 1966-05-22, MRN [id number]  PCP:  Daisy Floro, MD  Cardiologist:  No primary care provider on file.  Electrophysiologist:  None   Referring MD: Daisy Floro, MD   Chief Complaint  Patient presents with  . Palpitations    History of Present Illness:    IVELIZ GARAY is a 54 y.o. female with a hx of hypertension, morbid obesity, BPD, tobacco use, SVT who is referred by Dr. Tenny Craw for evaluation of tachycardia.  She reports that she has frequent palpitations where she feels like her heart is racing.  States that it occurs daily if she is not taking metoprolol.  Typically lasts for few minutes and resolves.  Heart rate feels fast during episodes but does not feel irregular.  Reports she feels lightheaded during episodes but has not had any syncope.  She does not exercise.  She denies any chest pain or dyspnea.  Reports intermittent lower extremity edema if she has been on her feet all day.  She smokes 0.5 packs/day, since age 25.  Mother had CABG in 56s.   Past Medical History:  Diagnosis Date  . Anxiety   . Bipolar 1 disorder (HCC)   . Hypertension   . Post-traumatic stress syndrome     Past Surgical History:  Procedure Laterality Date  . BLADDER SURGERY    . CHOLECYSTECTOMY    . TUBAL LIGATION      Current Medications: Current Meds  Medication Sig  . ALPRAZolam (XANAX) 1 MG tablet Take 1 tablet (1 mg total) by mouth 3 (three) times daily as needed for anxiety.  . cholecalciferol (VITAMIN D) 400 UNITS TABS tablet Take 1,200 Units by mouth 2 (two) times a week. Reported on 11/09/2015  . diclofenac sodium (VOLTAREN) 1 % GEL Apply 2 g topically 4 (four) times daily.  . hydrochlorothiazide (HYDRODIURIL) 25 MG tablet Take 25 mg by mouth daily. Reported on 11/09/2015  . ibuprofen (ADVIL) 600 MG tablet Take 1 tablet (600 mg total) by mouth every 6 (six) hours as needed.  . metFORMIN (GLUCOPHAGE XR)  750 MG 24 hr tablet Take 1 tablet (750 mg total) by mouth daily with breakfast.  . methocarbamol (ROBAXIN) 500 MG tablet Take 1 tablet (500 mg total) by mouth 2 (two) times daily.  . metoprolol (LOPRESSOR) 50 MG tablet Take 50 mg by mouth 2 (two) times daily.  . QUEtiapine (SEROQUEL) 300 MG tablet Take 2 tablets (600 mg total) by mouth at bedtime.     Allergies:   Patient has no known allergies.   Social History   Socioeconomic History  . Marital status: Single    Spouse name: Not on file  . Number of children: 2  . Years of education: 63  . Highest education level: Not on file  Occupational History  . Not on file  Tobacco Use  . Smoking status: Current Every Day Smoker    Packs/day: 0.50    Types: Cigarettes    Last attempt to quit: 08/29/2016    Years since quitting: 3.3  . Smokeless tobacco: Never Used  Vaping Use  . Vaping Use: Never used  Substance and Sexual Activity  . Alcohol use: No  . Drug use: Not Currently    Types: Marijuana  . Sexual activity: Never    Birth control/protection: Surgical  Other Topics Concern  . Not on file  Social History Narrative   Pt lives  with her son in [place]. 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 Born and raised in [place] by mom until 7 then by grandparents. Pt has one older brother. 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    Social Determinants of Health   Financial Resource Strain:   . Difficulty of Paying Living Expenses:   Food Insecurity:   . Worried About Programme researcher, broadcasting/film/video in the Last Year:   . Barista in the Last Year:   Transportation Needs:   . Freight forwarder (Medical):   Marland Kitchen Lack of Transportation (Non-Medical):   Physical Activity:   . Days of Exercise per Week:   . Minutes of Exercise per Session:   Stress:   . Feeling of Stress :   Social Connections:   . Frequency of Communication with Friends and Family:   . Frequency of Social Gatherings with  Friends and Family:   . Attends Religious Services:   . Active Member of Clubs or Organizations:   . Attends Banker Meetings:   Marland Kitchen Marital Status:      Family History: The patient's family history includes Alcohol abuse in her maternal uncle; Drug abuse in her maternal uncle and mother.  ROS:   Please see the history of present illness.    All other systems reviewed and are negative.  EKGs/Labs/Other Studies Reviewed:    The following studies were reviewed today:   EKG:  EKG is ordered today.  The ekg ordered today demonstrates normal sinus rhythm, rate 90, QTc 464, nonspecific T wave flattening, right axis deviation  Recent Labs: 01/14/2020: BUN 12; Creatinine, Ser 0.91; Magnesium 2.1; Potassium 3.6; Sodium 141; TSH WILL FOLLOW  Recent Lipid Panel    Component Value Date/Time   CHOL 137 09/28/2019 0915   TRIG 132 09/28/2019 0915   HDL 50 09/28/2019 0915   CHOLHDL 2.7 09/28/2019 0915   LDLCALC 64 09/28/2019 0915    Physical Exam:    VS:  BP 125/78   Pulse 90   Temp (!) 96.6 F (35.9 C)   Ht 5\' 7"  (1.702 m)   Wt 257 lb 9.6 oz (116.8 kg)   LMP 10/12/2011   SpO2 94%   BMI 40.35 kg/m     Wt Readings from Last 3 Encounters:  01/14/20 257 lb 9.6 oz (116.8 kg)  03/18/19 240 lb (108.9 kg)  02/02/19 240 lb (108.9 kg)     04/05/19 nourished, well developed in no acute distress HEENT: Normal NECK: No JVD; No carotid bruits LYMPHATICS: No lymphadenopathy CARDIAC: RRR, no murmurs, rubs, gallops RESPIRATORY:  Clear to auscultation without rales, wheezing or rhonchi  ABDOMEN: Soft, non-tender, non-distended MUSCULOSKELETAL:  No edema; No deformity  SKIN: Warm and dry NEUROLOGIC:  Alert and oriented x 3 PSYCHIATRIC:  Normal affect   ASSESSMENT:    1. Palpitations   2. Essential hypertension   3. Tobacco use    PLAN:    Palpitations: Description concerning for arrhythmia, possibly SVT.  Will evaluate with Zio patch x3 days.  Currently on Toprol-XL 50  mg twice daily.  Asked her to hold her metoprolol while wearing the monitor.  Will check echocardiogram to rule out structural heart disease.  Will check BMP, magnesium, TSH  Hypertension: On hydrochlorothiazide 25 mg daily.  Appears controlled.  Tobacco use: Patient counseled on the risks of tobacco use and cessation strongly encouraged.  RTC in 3 months  Medication Adjustments/Labs and Tests Ordered: Current medicines are reviewed at length with the patient today.  Concerns regarding medicines are outlined above.  Orders Placed This Encounter  Procedures  . Basic metabolic panel  . Magnesium  . TSH  . LONG TERM MONITOR (3-14 DAYS)  . EKG 12-Lead  . ECHOCARDIOGRAM COMPLETE   No orders of the defined types were placed in this encounter.   Patient Instructions  Medication Instructions:  Your physician recommends that you continue on your current medications as directed. Please refer to the Current Medication list given to you today.  *If you need a refill on your cardiac medications before your next appointment, please call your pharmacy*   Lab Work: BMET, Mag, TSH today  If you have labs (blood work) drawn today and your tests are completely normal, you will receive your results only by: Marland Kitchen MyChart Message (if you have MyChart) OR . A paper copy in the mail If you have any lab test that is abnormal or we need to change your treatment, we will call you to review the results.   Testing/Procedures: Your physician has requested that you have an echocardiogram. Echocardiography is a painless test that uses sound waves to create images of your heart. It provides your doctor with information about the size and shape of your heart and how well your heart's chambers and valves are working. This procedure takes approximately one hour. There are no restrictions for this procedure.  This will be done at our [facility] location:  [address]   ZIO XT- Long Term  Monitor Instructions   Your physician has requested you wear your ZIO patch monitor 3 days.  (HOLD METOPROLOL WHILE WEARING MONITOR), restart when you take monitor off.   This is a single patch monitor.  [employer] supplies one patch monitor per enrollment.  Additional stickers are not available.   Please do not apply patch if you will be having a Nuclear Stress Test, Echocardiogram, Cardiac CT, MRI, or Chest Xray during the time frame you would be wearing the monitor. The patch cannot be worn during these tests.  You cannot remove and re-apply the ZIO XT patch monitor.   Your ZIO patch monitor will be sent USPS Priority mail from [facility] directly to your home address. The monitor may also be mailed to a PO BOX if home delivery is not available.   It may take 3-5 days to receive your monitor after you have been enrolled.   Once you have received you monitor, please review enclosed instructions.  Your monitor has already been registered assigning a specific monitor serial # to you.   Applying the monitor   Shave hair from upper left chest.   Hold abrader disc by orange tab.  Rub abrader in 40 strokes over left upper chest as indicated in your monitor instructions.   Clean area with 4 enclosed alcohol pads .  Use all pads to assure are is cleaned thoroughly.  Let dry.   Apply patch as indicated in monitor instructions.  Patch will be place under collarbone on left side of chest with arrow pointing upward.   Rub patch adhesive wings for 2 minutes.Remove white label marked "1".  Remove white label marked "2".  Rub patch adhesive wings for 2 additional minutes.   While looking in a mirror, press and release button in center of patch.  A small green light will flash 3-4 times .  This will be your only indicator  the monitor has been turned on.     Do not shower for the first 24 hours.  You may shower after the first 24 hours.   Press button if you feel a symptom. You will hear a small  click.  Record Date, Time and Symptom in the Patient Log Book.   When you are ready to remove patch, follow instructions on last 2 pages of Patient Log Book.  Stick patch monitor onto last page of Patient Log Book.   Place Patient Log Book in [place] box.  Use locking tab on box and tape box closed securely.  The [employer] and [employer] has [employer] on it.  Please place in mailbox as soon as possible.  Your physician should have your test results approximately 7 days after the monitor has been mailed back to [facility]   Call Welcome at [phone] if you have questions regarding your ZIO XT patch monitor.  Call them immediately if you see an orange light blinking on your monitor.   If your monitor falls off in less than 4 days contact our Monitor department at [phone].  If your monitor becomes loose or falls off after 4 days call [employer] at [phone] for suggestions on securing your monitor.     Follow-Up: At [facility], you and your health needs are our priority.  As part of our continuing mission to provide you with exceptional heart care, we have created designated Provider Care Teams.  These Care Teams include your primary Cardiologist (physician) and Advanced Practice Providers (APPs -  Physician Assistants and Nurse Practitioners) who all work together to provide you with the care you need, when you need it.  We recommend signing up for the patient portal called "MyChart".  Sign up information is provided on this After Visit Summary.  MyChart is used to connect with patients for Virtual Visits (Telemedicine).  Patients are able to view lab/test results, encounter notes, upcoming appointments, etc.  Non-urgent messages can be sent to your provider as well.   To learn more about what you can do with MyChart, go to [email or web address].    Your next appointment:   3 month(s)  The format for your next appointment:   In Person  Provider:     Oswaldo Milian, MD         Signed, Donato Heinz, MD  01/14/2020 5:21 PM    [place]

## 2020-01-14 NOTE — Telephone Encounter (Signed)
Enrolled patient for a 3 day Zio Monitor to be mailed to patients home.  

## 2020-01-14 NOTE — Patient Instructions (Addendum)
Medication Instructions:  Your physician recommends that you continue on your current medications as directed. Please refer to the Current Medication list given to you today.  *If you need a refill on your cardiac medications before your next appointment, please call your pharmacy*   Lab Work: BMET, Mag, TSH today  If you have labs (blood work) drawn today and your tests are completely normal, you will receive your results only by: Marland Kitchen MyChart Message (if you have MyChart) OR . A paper copy in the mail If you have any lab test that is abnormal or we need to change your treatment, we will call you to review the results.   Testing/Procedures: Your physician has requested that you have an echocardiogram. Echocardiography is a painless test that uses sound waves to create images of your heart. It provides your doctor with information about the size and shape of your heart and how well your heart's chambers and valves are working. This procedure takes approximately one hour. There are no restrictions for this procedure.  This will be done at our [facility] location:  [address]   ZIO XT- Long Term Monitor Instructions   Your physician has requested you wear your ZIO patch monitor 3 days.  (HOLD METOPROLOL WHILE WEARING MONITOR), restart when you take monitor off.   This is a single patch monitor.  [employer] supplies one patch monitor per enrollment.  Additional stickers are not available.   Please do not apply patch if you will be having a Nuclear Stress Test, Echocardiogram, Cardiac CT, MRI, or Chest Xray during the time frame you would be wearing the monitor. The patch cannot be worn during these tests.  You cannot remove and re-apply the ZIO XT patch monitor.   Your ZIO patch monitor will be sent USPS Priority mail from [facility] directly to your home address. The monitor may also be mailed to a PO BOX if home delivery is not available.   It may take 3-5 days  to receive your monitor after you have been enrolled.   Once you have received you monitor, please review enclosed instructions.  Your monitor has already been registered assigning a specific monitor serial # to you.   Applying the monitor   Shave hair from upper left chest.   Hold abrader disc by orange tab.  Rub abrader in 40 strokes over left upper chest as indicated in your monitor instructions.   Clean area with 4 enclosed alcohol pads .  Use all pads to assure are is cleaned thoroughly.  Let dry.   Apply patch as indicated in monitor instructions.  Patch will be place under collarbone on left side of chest with arrow pointing upward.   Rub patch adhesive wings for 2 minutes.Remove white label marked "1".  Remove white label marked "2".  Rub patch adhesive wings for 2 additional minutes.   While looking in a mirror, press and release button in center of patch.  A small green light will flash 3-4 times .  This will be your only indicator the monitor has been turned on.     Do not shower for the first 24 hours.  You may shower after the first 24 hours.   Press button if you feel a symptom. You will hear a small click.  Record Date, Time and Symptom in the Patient Log Book.   When you are ready to remove patch, follow instructions on last 2 pages of Patient Log Book.  Stick patch monitor onto last page of Patient Log Book.   Place Patient Log Book in [place] box.  Use locking tab on box and tape box closed securely.  The [employer] has [employer] on it.  Please place in mailbox as soon as possible.  Your physician should have your test results approximately 7 days after the monitor has been mailed back to [facility].   Call Lucas at [phone] if you have questions regarding your ZIO XT patch monitor.  Call them immediately if you see an orange light blinking on your monitor.   If your monitor falls off in less than 4 days contact our Monitor  department at [phone].  If your monitor becomes loose or falls off after 4 days call [employer] at [phone] for suggestions on securing your monitor.     Follow-Up: At [facility], you and your health needs are our priority.  As part of our continuing mission to provide you with exceptional heart care, we have created designated Provider Care Teams.  These Care Teams include your primary Cardiologist (physician) and Advanced Practice Providers (APPs -  Physician Assistants and Nurse Practitioners) who all work together to provide you with the care you need, when you need it.  We recommend signing up for the patient portal called "MyChart".  Sign up information is provided on this After Visit Summary.  MyChart is used to connect with patients for Virtual Visits (Telemedicine).  Patients are able to view lab/test results, encounter notes, upcoming appointments, etc.  Non-urgent messages can be sent to your provider as well.   To learn more about what you can do with MyChart, go to [email or web address].    Your next appointment:   3 month(s)  The format for your next appointment:   In Person  Provider:   Oswaldo Milian, MD

## 2020-01-19 ENCOUNTER — Encounter: Payer: Self-pay | Admitting: *Deleted

## 2020-01-20 ENCOUNTER — Other Ambulatory Visit (INDEPENDENT_AMBULATORY_CARE_PROVIDER_SITE_OTHER): Payer: 59

## 2020-01-20 DIAGNOSIS — R002 Palpitations: Secondary | ICD-10-CM

## 2020-02-10 ENCOUNTER — Ambulatory Visit (HOSPITAL_COMMUNITY): Payer: 59 | Attending: Internal Medicine

## 2020-02-10 ENCOUNTER — Other Ambulatory Visit (HOSPITAL_COMMUNITY): Payer: Self-pay | Admitting: Psychiatry

## 2020-02-10 ENCOUNTER — Other Ambulatory Visit: Payer: Self-pay

## 2020-02-10 DIAGNOSIS — R002 Palpitations: Secondary | ICD-10-CM | POA: Diagnosis present

## 2020-02-10 DIAGNOSIS — F3176 Bipolar disorder, in full remission, most recent episode depressed: Secondary | ICD-10-CM

## 2020-02-10 LAB — ECHOCARDIOGRAM COMPLETE
Area-P 1/2: 2.54 cm2
S' Lateral: 2.7 cm

## 2020-02-15 ENCOUNTER — Telehealth: Payer: Self-pay | Admitting: Cardiology

## 2020-02-15 NOTE — Telephone Encounter (Signed)
Patient is returning Hayley's call regarding echo results. Transferred call to [facility].

## 2020-02-23 ENCOUNTER — Telehealth: Payer: Self-pay | Admitting: Cardiology

## 2020-02-23 NOTE — Telephone Encounter (Signed)
Left message to call back  

## 2020-02-23 NOTE — Telephone Encounter (Signed)
New Message  Patient is returning the call to [facility]. Please give patient a call back.

## 2020-02-24 NOTE — Telephone Encounter (Signed)
Left message to call back  

## 2020-02-24 NOTE — Telephone Encounter (Signed)
Follow Up  Patient is returning call to [facility]. Please give patient a call back.

## 2020-02-24 NOTE — Telephone Encounter (Signed)
Follow up: ° ° °Patient returning call  ° ° ° °

## 2020-02-24 NOTE — Telephone Encounter (Signed)
Left message to call back-advised to ask to speak to me or any triage nurse or provide permission to leave detailed message.

## 2020-03-07 ENCOUNTER — Encounter: Payer: Self-pay | Admitting: *Deleted

## 2020-03-07 NOTE — Telephone Encounter (Signed)
Results mailed to patient;.

## 2020-03-14 ENCOUNTER — Encounter (HOSPITAL_COMMUNITY): Payer: Self-pay | Admitting: Emergency Medicine

## 2020-03-14 ENCOUNTER — Other Ambulatory Visit: Payer: Self-pay

## 2020-03-14 ENCOUNTER — Emergency Department (HOSPITAL_COMMUNITY)
Admission: EM | Admit: 2020-03-14 | Discharge: 2020-03-14 | Disposition: A | Discharge status: Home / Self Care | Payer: 59 | Attending: Emergency Medicine | Admitting: Emergency Medicine

## 2020-03-14 DIAGNOSIS — F17210 Nicotine dependence, cigarettes, uncomplicated: Secondary | ICD-10-CM | POA: Insufficient documentation

## 2020-03-14 DIAGNOSIS — Z79899 Other long term (current) drug therapy: Secondary | ICD-10-CM | POA: Diagnosis not present

## 2020-03-14 DIAGNOSIS — M791 Myalgia, unspecified site: Secondary | ICD-10-CM | POA: Insufficient documentation

## 2020-03-14 DIAGNOSIS — M25561 Pain in right knee: Secondary | ICD-10-CM

## 2020-03-14 DIAGNOSIS — M25461 Effusion, right knee: Secondary | ICD-10-CM | POA: Insufficient documentation

## 2020-03-14 DIAGNOSIS — Z791 Long term (current) use of non-steroidal anti-inflammatories (NSAID): Secondary | ICD-10-CM | POA: Diagnosis not present

## 2020-03-14 DIAGNOSIS — I10 Essential (primary) hypertension: Secondary | ICD-10-CM | POA: Diagnosis not present

## 2020-03-14 NOTE — ED Provider Notes (Signed)
[facility]-EMERGENCY DEPT Provider Note   CSN: [id number] Arrival date & time: 03/14/20  1441     History Chief Complaint  Patient presents with  . Knee Pain    Jill [name] is a 54 y.o. female with a history of obesity, osteoarthritis of the knees, presented to emergency department with pain in her right knee.  Patient reports that she was walking on the stairs today and she felt a pop and began having pain on the medial aspect of her right knee.  She notes some swelling around the knee.  She says she is never had any issue with surgeries, fractures, or infection of her knee.  She has been able to ambulate since then.  She denies any numbness or weakness in her distal extremity.  She tried to make an appoint with her doctor but could not be seen for a few weeks because the doctor's office was busy.  She has never seen an orthopedic doctor.  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  HPI     Past Medical History:  Diagnosis Date  . Anxiety   . Bipolar 1 disorder (HCC)   . Hypertension   . Post-traumatic stress syndrome     Patient Active Problem List   Diagnosis Date Noted  . Bipolar 1 disorder, depressed, full remission (HCC) 10/22/2018  . Insomnia 11/09/2015  . PTSD (post-traumatic stress disorder) 11/09/2015  . GAD (generalized anxiety disorder) 11/09/2015    Past Surgical History:  Procedure Laterality Date  . BLADDER SURGERY    . CHOLECYSTECTOMY    . TUBAL LIGATION       OB History   No obstetric history on file.     Family History  Problem Relation Age of Onset  . Drug abuse Mother   . Alcohol abuse Maternal Uncle   . Drug abuse Maternal Uncle     Social History   Tobacco Use  . Smoking status: Current Every Day Smoker    Packs/day: 0.50    Types: Cigarettes    Last attempt to quit: 08/29/2016    Years since quitting: 3.5  . Smokeless tobacco: Never Used  Vaping Use  . Vaping Use: Never used   Substance Use Topics  . Alcohol use: No  . Drug use: Not Currently    Types: Marijuana    Home Medications Prior to Admission medications   Medication Sig Start Date End Date Taking? Authorizing Provider  ALPRAZolam Prudy Feeler) 1 MG tablet Take 1 tablet (1 mg total) by mouth 3 (three) times daily as needed for anxiety. 11/16/19 03/15/20  Pucilowski, Roosvelt Maser, MD  cholecalciferol (VITAMIN D) 400 UNITS TABS tablet Take 1,200 Units by mouth 2 (two) times a week. Reported on 11/09/2015    [provider]  diclofenac sodium (VOLTAREN) 1 % GEL Apply 2 g topically 4 (four) times daily. 03/18/19   Jeannie Fend, PA-C  hydrochlorothiazide (HYDRODIURIL) 25 MG tablet Take 25 mg by mouth daily. Reported on 11/09/2015    [provider]  ibuprofen (ADVIL) 600 MG tablet Take 1 tablet (600 mg total) by mouth every 6 (six) hours as needed. 09/15/19   Fayrene Helper, PA-C  metFORMIN (GLUCOPHAGE XR) 750 MG 24 hr tablet Take 1 tablet (750 mg total) by mouth daily with breakfast. 01/05/20 07/03/20  Pucilowski, Roosvelt Maser, MD  methocarbamol (ROBAXIN) 500 MG tablet Take 1 tablet (500 mg total) by mouth 2 (two) times daily. 02/02/19  Hyman Hopes, Margaux, PA-C  metoprolol (LOPRESSOR) 50 MG tablet Take 50 mg by mouth 2 (two) times daily.    [provider]  QUEtiapine (SEROQUEL) 300 MG tablet TAKE 2 TABLETS BY MOUTH AT BEDTIME 02/10/20   Pucilowski, Roosvelt Maser, MD    Allergies    Patient has no known allergies.  Review of Systems   Review of Systems  Constitutional: Negative for chills and fever.  Respiratory: Negative for cough and shortness of breath.   Cardiovascular: Negative for chest pain and palpitations.  Gastrointestinal: Negative for abdominal pain and vomiting.  Musculoskeletal: Positive for arthralgias and myalgias.  Skin: Negative for rash and wound.  Neurological: Negative for weakness and numbness.  Psychiatric/Behavioral: Negative for agitation and confusion.  All other systems  reviewed and are negative.   Physical Exam Updated Vital Signs BP (!) 130/103 (BP Location: Left Arm)   Pulse 87   Temp 98.3 F (36.8 C) (Oral)   Resp 18   LMP 10/12/2011   SpO2 97%   Physical Exam Vitals and nursing note reviewed.  Constitutional:      General: She is not in acute distress.    Appearance: She is well-developed. She is obese.  HENT:     Head: Normocephalic and atraumatic.  Eyes:     Conjunctiva/sclera: Conjunctivae normal.  Cardiovascular:     Rate and Rhythm: Normal rate and regular rhythm.     Pulses: Normal pulses.  Pulmonary:     Effort: Pulmonary effort is normal. No respiratory distress.  Musculoskeletal:     Cervical back: Neck supple.     Comments: No isolated ttp of the patellar head or fibular head Full ROM of the knee Able to bear full weight on ambulation in the ED Able to completely flex and extend affected knee Small peripatellar effusion noted without erythema or significant warmth No joint laxity noted on ACL/PCL testing of the knee  Skin:    General: Skin is warm and dry.  Neurological:     General: No focal deficit present.     Mental Status: She is alert and oriented to person, place, and time.     Sensory: No sensory deficit.     Motor: No weakness.  Psychiatric:        Mood and Affect: Mood normal.        Behavior: Behavior normal.     ED Results / Procedures / Treatments   Labs (all labs ordered are listed, but only abnormal results are displayed) Labs Reviewed - No data to display  EKG None  Radiology No results found.  Procedures Procedures (including critical care time)  Medications Ordered in ED Medications - No data to display  ED Course  I have reviewed the triage vital signs and the nursing notes.  Pertinent labs & imaging results that were available during my care of the patient were reviewed by me and considered in my medical decision making (see chart for details).  54 year old female present  emergency department with pain in her right knee after walking on the stairs and feeling a pop in her knee.  She has full range of motion is neurovascularly intact on exam.  She does have a moderate effusion around the knee.  Suspect this may be a meniscus injury or lateral ligament injury.  She has no excessive laxity of the knee on testing is just an ACL or PCL tear.  She is able to perform full range of motion I do not suspect that she has  a tendon rupture.  Very low suspicion for septic joint.  Very low suspicion for fracture.  She is [place] knee negative.  I recommended a compression sleeve over-the-counter for her knee, ice, Motrin and Tylenol.  If she is still having pain in 1 week she can follow-up with orthopedic doctor.  I will provide her a work note.  She verbalized understanding.  Final Clinical Impression(s) / ED Diagnoses Final diagnoses:  Acute pain of right knee  Effusion of right knee    Rx / DC Orders ED Discharge Orders    None       Terald Sleeper, MD 03/14/20 [phone]

## 2020-03-14 NOTE — Discharge Instructions (Signed)
You likely have a sprain or strain of the ligaments of your right knee.  I did not feel you had a fracture.  We talked about buying a compression sleeve over the counter for stability.  You can take motrin and tylenol at home for pain, and apply ice to your knee every 10 minutes, for 10 minutes at a time.  If you still have pain in 1 week, call to make an appointment with Dr Magnus Ivan at the orthopedic clinic.

## 2020-04-06 ENCOUNTER — Other Ambulatory Visit: Payer: Self-pay

## 2020-04-06 ENCOUNTER — Telehealth (INDEPENDENT_AMBULATORY_CARE_PROVIDER_SITE_OTHER): Payer: 59 | Admitting: Psychiatry

## 2020-04-06 DIAGNOSIS — F4310 Post-traumatic stress disorder, unspecified: Secondary | ICD-10-CM | POA: Diagnosis not present

## 2020-04-06 DIAGNOSIS — F411 Generalized anxiety disorder: Secondary | ICD-10-CM

## 2020-04-06 DIAGNOSIS — F3176 Bipolar disorder, in full remission, most recent episode depressed: Secondary | ICD-10-CM | POA: Diagnosis not present

## 2020-04-06 MED ORDER — ALPRAZOLAM 1 MG PO TABS: 1.0000 mg | ORAL_TABLET | Freq: Three times a day (TID) | ORAL | 2 refills | Status: DC | PRN

## 2020-04-06 MED ORDER — QUETIAPINE FUMARATE 300 MG PO TABS: 600.0000 mg | ORAL_TABLET | Freq: Every day | ORAL | 0 refills | Status: DC
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
## 2020-04-15 NOTE — Progress Notes (Signed)
Cardiology Office Note:    Date:  04/19/2020   ID:  HENRY DEMERITT, DOB [birth year], MRN [id number]  PCP:  Daisy Floro, MD  Cardiologist:  No primary care provider on file.  Electrophysiologist:  None   Referring MD: Daisy Floro, MD   No chief complaint on file.   History of Present Illness:    Jill [name] is a 54 y.o. female with a hx of hypertension, morbid obesity, BPD, tobacco use, SVT who presents for follow-up.  She was referred by Dr. Tenny Craw for evaluation of tachycardia, initially seen on 01/14/2020.  She reports that she has frequent palpitations where she feels like her heart is racing.  States that it occurs daily if she is not taking metoprolol.  Typically lasts for few minutes and resolves.  Heart rate feels fast during episodes but does not feel irregular.  Reports she feels lightheaded during episodes but has not had any syncope.  She does not exercise.  She denies any chest pain or dyspnea.  Reports intermittent lower extremity edema if she has been on her feet all day.  She smokes 0.5 packs/day, since age 43.  Mother had CABG in 33s.  Echocardiogram on 02/10/2020 showed normal biventricular function, grade 1 diastolic dysfunction, no significant valvular disease.  Zio patch x3 days showed no significant arrhythmias, did have Mobitz type I AV block.  Since last clinic visit, she reports that she has been doing well.  States that the palpitations have resolved.  She denies any chest pain, dyspnea, lightheadedness, syncope.  Does note that she has some lower extremity edema after work.  She has not been exercising.  She continues to smoke 0.5 packs/day.   Past Medical History:  Diagnosis Date  . Anxiety   . Bipolar 1 disorder (HCC)   . Hypertension   . Post-traumatic stress syndrome     Past Surgical History:  Procedure Laterality Date  . BLADDER SURGERY    . CHOLECYSTECTOMY    . TUBAL LIGATION      Current Medications: Current Meds  Medication Sig    . ALPRAZolam (XANAX) 1 MG tablet Take 1 tablet (1 mg total) by mouth 3 (three) times daily as needed for anxiety.  . cholecalciferol (VITAMIN D) 400 UNITS TABS tablet Take 1,200 Units by mouth 2 (two) times a week. Reported on 11/09/2015  . diclofenac sodium (VOLTAREN) 1 % GEL Apply 2 g topically 4 (four) times daily.  . hydrochlorothiazide (HYDRODIURIL) 25 MG tablet Take 25 mg by mouth daily. Reported on 11/09/2015  . ibuprofen (ADVIL) 600 MG tablet Take 1 tablet (600 mg total) by mouth every 6 (six) hours as needed.  . metFORMIN (GLUCOPHAGE XR) 750 MG 24 hr tablet Take 1 tablet (750 mg total) by mouth daily with breakfast.  . methocarbamol (ROBAXIN) 500 MG tablet Take 1 tablet (500 mg total) by mouth 2 (two) times daily.  . metoprolol (LOPRESSOR) 50 MG tablet Take 50 mg by mouth 2 (two) times daily.  . QUEtiapine (SEROQUEL) 300 MG tablet Take 2 tablets (600 mg total) by mouth at bedtime.     Allergies:   Patient has no known allergies.   Social History   Socioeconomic History  . Marital status: Single    Spouse name: Not on file  . Number of children: 2  . Years of education: 5  . Highest education level: Not on file  Occupational History  . Not on file  Tobacco Use  . Smoking status: Current Every  Day Smoker    Packs/day: 0.50    Types: Cigarettes    Last attempt to quit: 08/29/2016    Years since quitting: 3.6  . Smokeless tobacco: Never Used  Vaping Use  . Vaping Use: Never used  Substance and Sexual Activity  . Alcohol use: No  . Drug use: Not Currently    Types: Marijuana  . Sexual activity: Never    Birth control/protection: Surgical  Other Topics Concern  . Not on file  Social History Narrative   Pt lives with her son in [place]. 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 Born and raised in [place] by mom until 7 then by grandparents. Pt has one older brother. 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    Social Determinants of  Health   Financial Resource Strain:   . Difficulty of Paying Living Expenses: Not on file  Food Insecurity:   . Worried About Programme researcher, broadcasting/film/video in the Last Year: Not on file  . Ran Out of Food in the Last Year: Not on file  Transportation Needs:   . Lack of Transportation (Medical): Not on file  . Lack of Transportation (Non-Medical): Not on file  Physical Activity:   . Days of Exercise per Week: Not on file  . Minutes of Exercise per Session: Not on file  Stress:   . Feeling of Stress : Not on file  Social Connections:   . Frequency of Communication with Friends and Family: Not on file  . Frequency of Social Gatherings with Friends and Family: Not on file  . Attends Religious Services: Not on file  . Active Member of Clubs or Organizations: Not on file  . Attends Banker Meetings: Not on file  . Marital Status: Not on file     Family History: The patient's family history includes Alcohol abuse in her maternal uncle; Drug abuse in her maternal uncle and mother.  ROS:   Please see the history of present illness.    All other systems reviewed and are negative.  EKGs/Labs/Other Studies Reviewed:    The following studies were reviewed today:   EKG:  EKG is ordered today.  The ekg ordered today demonstrates normal sinus rhythm, rate 88, QT appears prolonged, nonspecific T wave flattening  Recent Labs: 01/14/2020: TSH 0.643 04/19/2020: BUN 10; Creatinine, Ser 0.94; Magnesium 2.0; Potassium 3.8; Sodium 141  Recent Lipid Panel    Component Value Date/Time   CHOL 137 09/28/2019 0915   TRIG 132 09/28/2019 0915   HDL 50 09/28/2019 0915   CHOLHDL 2.7 09/28/2019 0915   LDLCALC 64 09/28/2019 0915    Physical Exam:    VS:  BP 112/86   Pulse 88   Ht 5\' 7"  (1.702 m)   Wt 260 lb 6.4 oz (118.1 kg)   LMP 10/12/2011   BMI 40.78 kg/m     Wt Readings from Last 3 Encounters:  04/19/20 260 lb 6.4 oz (118.1 kg)  01/14/20 257 lb 9.6 oz (116.8 kg)  03/18/19 240 lb  (108.9 kg)     03/20/19 nourished, well developed in no acute distress HEENT: Normal NECK: No JVD; No carotid bruits LYMPHATICS: No lymphadenopathy CARDIAC: RRR, no murmurs, rubs, gallops RESPIRATORY:  Clear to auscultation without rales, wheezing or rhonchi  ABDOMEN: Soft, non-tender, non-distended MUSCULOSKELETAL:  No edema; No deformity  SKIN: Warm and dry NEUROLOGIC:  Alert and oriented x 3 PSYCHIATRIC:  Normal affect  ASSESSMENT:    1. Palpitations   2. QT prolongation   3. Essential hypertension   4. Tobacco use    PLAN:    Palpitations:  Echocardiogram on 02/10/2020 showed normal biventricular function, grade 1 diastolic dysfunction, no significant valvular disease.  Zio patch x3 days showed no significant arrhythmias, did have Mobitz type I AV block.  Reports palpitations have resolved.  Hypertension: On hydrochlorothiazide 25 mg daily.  Appears controlled.  Tobacco use: Patient counseled on the risks of tobacco use and cessation strongly encouraged.  Will ask our care guide to work with her on smoking cessation  QT prolongation: mild QT prolongation on EKG, will check BMET/magnesium.  If unremarkable, likely due to seroquel.  RTC in 1 year  Medication Adjustments/Labs and Tests Ordered: Current medicines are reviewed at length with the patient today.  Concerns regarding medicines are outlined above.  Orders Placed This Encounter  Procedures  . Basic metabolic panel  . Magnesium  . EKG 12-Lead   No orders of the defined types were placed in this encounter.   Patient Instructions  Medication Instructions:  The current medical regimen is effective;  continue present plan and medications.  *If you need a refill on your cardiac medications before your next appointment, please call your pharmacy*   Lab Work: BMET, MAG today   If you have labs (blood work) drawn today and your tests are completely normal, you will receive your results only by: Marland Kitchen MyChart  Message (if you have MyChart) OR . A paper copy in the mail If you have any lab test that is abnormal or we need to change your treatment, we will call you to review the results.   Follow-Up: At [facility], you and your health needs are our priority.  As part of our continuing mission to provide you with exceptional heart care, we have created designated Provider Care Teams.  These Care Teams include your primary Cardiologist (physician) and Advanced Practice Providers (APPs -  Physician Assistants and Nurse Practitioners) who all work together to provide you with the care you need, when you need it.  We recommend signing up for the patient portal called "MyChart".  Sign up information is provided on this After Visit Summary.  MyChart is used to connect with patients for Virtual Visits (Telemedicine).  Patients are able to view lab/test results, encounter notes, upcoming appointments, etc.  Non-urgent messages can be sent to your provider as well.   To learn more about what you can do with MyChart, go to [email or web address].    Your next appointment:   12 month(s)  The format for your next appointment:   In Person  Provider:   Epifanio Lesches, MD   Other Instructions You will be contacted by our Care Guide: Jill [name] for information and ways to stop smoking.       Signed, Jill Ishikawa, MD  04/19/2020 11:25 PM    [facility]

## 2020-04-19 ENCOUNTER — Encounter: Payer: Self-pay | Admitting: Cardiology

## 2020-04-19 ENCOUNTER — Other Ambulatory Visit: Payer: Self-pay

## 2020-04-19 ENCOUNTER — Ambulatory Visit (INDEPENDENT_AMBULATORY_CARE_PROVIDER_SITE_OTHER): Payer: 59 | Admitting: Cardiology

## 2020-04-19 VITALS — BP 112/86 | HR 88 | Ht 67.0 in | Wt 260.4 lb

## 2020-04-19 DIAGNOSIS — Z720 Tobacco use: Secondary | ICD-10-CM | POA: Diagnosis not present

## 2020-04-19 DIAGNOSIS — I10 Essential (primary) hypertension: Secondary | ICD-10-CM | POA: Diagnosis not present

## 2020-04-19 DIAGNOSIS — R002 Palpitations: Secondary | ICD-10-CM | POA: Diagnosis not present

## 2020-04-19 DIAGNOSIS — R9431 Abnormal electrocardiogram [ECG] [EKG]: Secondary | ICD-10-CM

## 2020-04-19 LAB — BASIC METABOLIC PANEL
BUN/Creatinine Ratio: 11 (ref 9–23)
BUN: 10 mg/dL (ref 6–24)
CO2: 28 mmol/L (ref 20–29)
Calcium: 9.5 mg/dL (ref 8.7–10.2)
Chloride: 99 mmol/L (ref 96–106)
Creatinine, Ser: 0.94 mg/dL (ref 0.57–1.00)
GFR calc Af Amer: 79 mL/min/{1.73_m2} (ref >59)
GFR calc non Af Amer: 69 mL/min/{1.73_m2} (ref >59)
Glucose: 144 mg/dL — ABNORMAL HIGH (ref 65–99)
Potassium: 3.8 mmol/L (ref 3.5–5.2)
Sodium: 141 mmol/L (ref 134–144)

## 2020-04-19 LAB — MAGNESIUM: Magnesium: 2 mg/dL (ref 1.6–2.3)

## 2020-04-19 NOTE — Patient Instructions (Signed)
Medication Instructions:  The current medical regimen is effective;  continue present plan and medications.  *If you need a refill on your cardiac medications before your next appointment, please call your pharmacy*   Lab Work: BMET, MAG today   If you have labs (blood work) drawn today and your tests are completely normal, you will receive your results only by: Marland Kitchen MyChart Message (if you have MyChart) OR . A paper copy in the mail If you have any lab test that is abnormal or we need to change your treatment, we will call you to review the results.   Follow-Up: At [name], you and your health needs are our priority.  As part of our continuing mission to provide you with exceptional heart care, we have created designated Provider Care Teams.  These Care Teams include your primary Cardiologist (physician) and Advanced Practice Providers (APPs -  Physician Assistants and Nurse Practitioners) who all work together to provide you with the care you need, when you need it.  We recommend signing up for the patient portal called "MyChart".  Sign up information is provided on this After Visit Summary.  MyChart is used to connect with patients for Virtual Visits (Telemedicine).  Patients are able to view lab/test results, encounter notes, upcoming appointments, etc.  Non-urgent messages can be sent to your provider as well.   To learn more about what you can do with MyChart, go to [email or web address].    Your next appointment:   12 month(s)  The format for your next appointment:   In Person  Provider:   Epifanio Lesches, MD   Other Instructions You will be contacted by our Care Guide: Renaee Munda for information and ways to stop smoking.

## 2020-04-21 ENCOUNTER — Other Ambulatory Visit: Payer: Self-pay | Admitting: *Deleted

## 2020-04-21 DIAGNOSIS — R9431 Abnormal electrocardiogram [ECG] [EKG]: Secondary | ICD-10-CM

## 2020-04-21 MED ORDER — POTASSIUM CHLORIDE CRYS ER 20 MEQ PO TBCR
20.0000 meq | EXTENDED_RELEASE_TABLET | Freq: Every day | ORAL | 3 refills | Status: DC
Start: 2020-04-21 — End: 2023-11-13

## 2020-04-26 ENCOUNTER — Other Ambulatory Visit: Payer: Self-pay

## 2020-04-26 ENCOUNTER — Ambulatory Visit (INDEPENDENT_AMBULATORY_CARE_PROVIDER_SITE_OTHER): Payer: 59 | Admitting: *Deleted

## 2020-04-26 VITALS — HR 77

## 2020-04-26 DIAGNOSIS — R9431 Abnormal electrocardiogram [ECG] [EKG]: Secondary | ICD-10-CM

## 2020-04-26 LAB — BASIC METABOLIC PANEL
BUN/Creatinine Ratio: 7 — ABNORMAL LOW (ref 9–23)
BUN: 7 mg/dL (ref 6–24)
CO2: 26 mmol/L (ref 20–29)
Calcium: 9.8 mg/dL (ref 8.7–10.2)
Chloride: 101 mmol/L (ref 96–106)
Creatinine, Ser: 0.99 mg/dL (ref 0.57–1.00)
GFR calc Af Amer: 74 mL/min/{1.73_m2} (ref >59)
GFR calc non Af Amer: 64 mL/min/{1.73_m2} (ref >59)
Glucose: 132 mg/dL — ABNORMAL HIGH (ref 65–99)
Potassium: 3.7 mmol/L (ref 3.5–5.2)
Sodium: 141 mmol/L (ref 134–144)

## 2020-04-26 NOTE — Progress Notes (Signed)
1.) Reason for visit: EKG  2.) Name of MD requesting visit: Dr. Bjorn Pippin   Patient presents for EKG and repeat lab work. EKG at OV 9/22, QT prolongation.    EKG completed and reviewed by Dr. Allyson Sabal (DOD).    QT interval now normal.  Patient taken to lab for repeat blood work.  Patient aware and verbalized understanding.  No changes at this time.

## 2020-05-01 ENCOUNTER — Telehealth: Payer: Self-pay | Admitting: Cardiology

## 2020-05-01 NOTE — Telephone Encounter (Signed)
Spoke with patient and reviewed lab results. Patient verbalized understanding.

## 2020-05-01 NOTE — Telephone Encounter (Signed)
Pt returning call in regards to lab results.  

## 2020-05-02 ENCOUNTER — Telehealth: Payer: Self-pay

## 2020-05-02 DIAGNOSIS — Z0000 Encounter for general adult medical examination without abnormal findings: Secondary | ICD-10-CM

## 2020-05-02 NOTE — Telephone Encounter (Signed)
Called patient to discuss health coaching for smoking cessation per Dr. Bjorn Pippin. Left patient a message to return call to Care Guide at [phone]. Will attempt to contact patient again.

## 2020-05-16 ENCOUNTER — Other Ambulatory Visit: Payer: Self-pay | Admitting: Internal Medicine

## 2020-05-16 DIAGNOSIS — Z1231 Encounter for screening mammogram for malignant neoplasm of breast: Secondary | ICD-10-CM

## 2020-05-20 ENCOUNTER — Ambulatory Visit
Admission: RE | Admit: 2020-05-20 | Discharge: 2020-05-20 | Disposition: A | Discharge status: Home / Self Care | Payer: 59 | Source: Ambulatory Visit | Attending: Internal Medicine | Admitting: Internal Medicine

## 2020-05-20 ENCOUNTER — Other Ambulatory Visit: Payer: Self-pay

## 2020-05-20 DIAGNOSIS — Z1231 Encounter for screening mammogram for malignant neoplasm of breast: Secondary | ICD-10-CM

## 2020-06-16 ENCOUNTER — Other Ambulatory Visit (HOSPITAL_COMMUNITY): Payer: Self-pay | Admitting: Psychiatry

## 2020-06-16 DIAGNOSIS — F3176 Bipolar disorder, in full remission, most recent episode depressed: Secondary | ICD-10-CM

## 2020-06-28 ENCOUNTER — Other Ambulatory Visit: Payer: Self-pay

## 2020-06-28 ENCOUNTER — Other Ambulatory Visit (HOSPITAL_COMMUNITY): Payer: Self-pay | Admitting: *Deleted

## 2020-06-28 ENCOUNTER — Telehealth (INDEPENDENT_AMBULATORY_CARE_PROVIDER_SITE_OTHER): Payer: 59 | Admitting: Psychiatry

## 2020-06-28 ENCOUNTER — Telehealth (HOSPITAL_COMMUNITY): Payer: Self-pay | Admitting: *Deleted

## 2020-06-28 ENCOUNTER — Encounter (HOSPITAL_COMMUNITY): Payer: Self-pay | Admitting: Psychiatry

## 2020-06-28 DIAGNOSIS — F4310 Post-traumatic stress disorder, unspecified: Secondary | ICD-10-CM | POA: Diagnosis not present

## 2020-06-28 DIAGNOSIS — F411 Generalized anxiety disorder: Secondary | ICD-10-CM

## 2020-06-28 DIAGNOSIS — F3176 Bipolar disorder, in full remission, most recent episode depressed: Secondary | ICD-10-CM

## 2020-06-28 DIAGNOSIS — F319 Bipolar disorder, unspecified: Secondary | ICD-10-CM

## 2020-06-28 MED ORDER — QUETIAPINE FUMARATE 300 MG PO TABS: 600.0000 mg | ORAL_TABLET | Freq: Every day | ORAL | 0 refills | Status: DC

## 2020-06-28 MED ORDER — ALPRAZOLAM 1 MG PO TABS: 1.0000 mg | ORAL_TABLET | Freq: Three times a day (TID) | ORAL | 2 refills | Status: DC | PRN

## 2020-06-28 NOTE — Telephone Encounter (Signed)
Pt informed that labs, fasting, sent in to [facility] on [address]. Pt verbalizes understanding.
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
## 2020-07-06 ENCOUNTER — Telehealth (HOSPITAL_COMMUNITY): Payer: 59 | Admitting: Psychiatry

## 2020-09-13 ENCOUNTER — Telehealth (INDEPENDENT_AMBULATORY_CARE_PROVIDER_SITE_OTHER): Payer: 59 | Admitting: Psychiatry

## 2020-09-13 ENCOUNTER — Other Ambulatory Visit: Payer: Self-pay

## 2020-09-13 DIAGNOSIS — F411 Generalized anxiety disorder: Secondary | ICD-10-CM

## 2020-09-13 DIAGNOSIS — F4310 Post-traumatic stress disorder, unspecified: Secondary | ICD-10-CM

## 2020-09-13 DIAGNOSIS — F3132 Bipolar disorder, current episode depressed, moderate: Secondary | ICD-10-CM | POA: Diagnosis not present

## 2020-09-13 LAB — HEMOGLOBIN A1C
Est. average glucose Bld gHb Est-mCnc: 146 mg/dL
Hgb A1c MFr Bld: 6.7 % — ABNORMAL HIGH (ref 4.8–5.6)

## 2020-09-13 LAB — LIPID PANEL
Chol/HDL Ratio: 3.5 ratio (ref 0.0–4.4)
Cholesterol, Total: 151 mg/dL (ref 100–199)
HDL: 43 mg/dL (ref >39)
LDL Chol Calc (NIH): 54 mg/dL (ref 0–99)
Triglycerides: 357 mg/dL — ABNORMAL HIGH (ref 0–149)
VLDL Cholesterol Cal: 54 mg/dL — ABNORMAL HIGH (ref 5–40)

## 2020-09-13 MED ORDER — BUPROPION HCL ER (XL) 150 MG PO TB24: 150.0000 mg | ORAL_TABLET | Freq: Every day | ORAL | 0 refills | Status: DC

## 2020-09-13 MED ORDER — ALPRAZOLAM 1 MG PO TABS: 1.0000 mg | ORAL_TABLET | Freq: Three times a day (TID) | ORAL | 2 refills | Status: DC | PRN

## 2020-09-13 MED ORDER — QUETIAPINE FUMARATE 400 MG PO TABS
400.0000 mg | ORAL_TABLET | Freq: Every day | ORAL | 2 refills | Status: DC
Start: 2020-09-13 — End: 2020-11-21

## 2020-09-13 MED ORDER — ATORVASTATIN CALCIUM 10 MG PO TABS
10.0000 mg | ORAL_TABLET | Freq: Every day | ORAL | 11 refills | Status: DC
Start: 2020-09-13 — End: 2024-01-04
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
## 2020-09-26 ENCOUNTER — Telehealth (HOSPITAL_COMMUNITY): Payer: 59 | Admitting: Psychiatry

## 2020-10-10 ENCOUNTER — Telehealth (INDEPENDENT_AMBULATORY_CARE_PROVIDER_SITE_OTHER): Payer: 59 | Admitting: Psychiatry

## 2020-10-10 ENCOUNTER — Other Ambulatory Visit: Payer: Self-pay

## 2020-10-10 DIAGNOSIS — F4310 Post-traumatic stress disorder, unspecified: Secondary | ICD-10-CM

## 2020-10-10 DIAGNOSIS — F3131 Bipolar disorder, current episode depressed, mild: Secondary | ICD-10-CM

## 2020-10-10 DIAGNOSIS — F411 Generalized anxiety disorder: Secondary | ICD-10-CM
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
## 2020-10-11 ENCOUNTER — Telehealth (HOSPITAL_COMMUNITY): Payer: 59 | Admitting: Psychiatry

## 2020-11-14 ENCOUNTER — Telehealth (INDEPENDENT_AMBULATORY_CARE_PROVIDER_SITE_OTHER): Payer: 59 | Admitting: Psychiatry

## 2020-11-14 ENCOUNTER — Telehealth (HOSPITAL_COMMUNITY): Payer: Self-pay

## 2020-11-14 ENCOUNTER — Other Ambulatory Visit: Payer: Self-pay

## 2020-11-14 DIAGNOSIS — F3131 Bipolar disorder, current episode depressed, mild: Secondary | ICD-10-CM

## 2020-11-14 DIAGNOSIS — F411 Generalized anxiety disorder: Secondary | ICD-10-CM

## 2020-11-14 NOTE — Telephone Encounter (Signed)
Patient unhappy with visit with Dr. Daleen Bo.  Appointment was scheduled for 3pm and patient received phone call at 11:30 am.  Visit lasted for approximately 10 minutes.  Provider wants to change her medications and patient was trying to explain that her medications are helping her like they are prescribed currently.  Provider was short and "lippy" as patient described.  Provider did not tell her when to have follow-up and patient has been calling pharmacy since this morning to see if meds were called in, and nothing has been called in, as of 4:23 pm.

## 2020-11-20 ENCOUNTER — Other Ambulatory Visit (HOSPITAL_COMMUNITY): Payer: Self-pay | Admitting: Psychiatry

## 2020-11-21 ENCOUNTER — Encounter (HOSPITAL_COMMUNITY): Payer: Self-pay | Admitting: Psychiatry

## 2020-11-21 MED ORDER — QUETIAPINE FUMARATE 400 MG PO TABS
600.0000 mg | ORAL_TABLET | Freq: Every day | ORAL | 1 refills | Status: DC
Start: 2020-11-21 — End: 2020-12-06
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
## 2020-11-29 ENCOUNTER — Telehealth (HOSPITAL_COMMUNITY): Payer: 59 | Admitting: Psychiatry

## 2020-12-06 ENCOUNTER — Ambulatory Visit (INDEPENDENT_AMBULATORY_CARE_PROVIDER_SITE_OTHER): Payer: 59 | Admitting: Psychiatry

## 2020-12-06 ENCOUNTER — Other Ambulatory Visit: Payer: Self-pay

## 2020-12-06 DIAGNOSIS — F4323 Adjustment disorder with mixed anxiety and depressed mood: Secondary | ICD-10-CM

## 2020-12-06 MED ORDER — QUETIAPINE FUMARATE 400 MG PO TABS: 600.0000 mg | ORAL_TABLET | Freq: Every day | ORAL | 3 refills | Status: DC

## 2020-12-06 MED ORDER — ALPRAZOLAM 1 MG PO TABS: 1.0000 mg | ORAL_TABLET | Freq: Three times a day (TID) | ORAL | 2 refills | Status: DC | PRN

## 2020-12-06 NOTE — Progress Notes (Signed)
BH MD/PA/NP OP Progress Note  11/14/2020 3:46 PM Jill [name]  MRN:  [id number]   This was not an office visit for Jill [name].  This is a 55 year old African-American mother who is fully employed and is being seen in follow-up as her psychiatrist has left the Center.  She was previously diagnosed by the last 2 psychiatrist with bipolar disorder and presently takes Seroquel.  She also has anxiety clinics of takes a significant dose of Xanax.  The patient lives with her daughter and her fianc.  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  The patient stays the reimbursement is poor.  The patient denies persistent daily depression.  She is sleeping and eating well.  She is got good energy.  She loves to read.  This patient has never been psychiatrically hospitalized and never has been suicidal.  She denies use of alcohol but smokes marijuana on a daily basis.  She has no psychosis now and never has.  In the close evaluation the patient denies persistent daily depression.  Therefore I doubt she is ever had a major depressive episode.  She also denies persistent manic symptoms.  She 1 or 2 days where she might feel euphoric from 1 to 2 days where she might feel irritable but that usually precipitated and short-lived.  They certainly do not cause her any dysfunction.  The patient denies symptoms of generalized anxiety disorder or panic disorder or obsessive-compulsive disorder.  Medically she is being treated for hypertension.  The patient says her biggest stress is her living situation.  She would like to live independently.  The patient has never been married.  She has both a daughter and a son.  Her son lives in the city.  Once again the patient has never been psychiatrically hospitalized.  At this time she has no significant physical complaints.  She is  actually functioning fairly well.  Her goal is to continue her work and to try to get some financial stability perhaps 1 day to be able to work independently.  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  Patient also was trained to medical records but did not like it.  In essence she is not found anything that she truly likes in her work setting.    Visit Diagnosis:  No diagnosis found.  Past Psychiatric History: Please see intake H&P.  Past Medical History:  Past Medical History:  Diagnosis Date  . Anxiety   . Bipolar 1 disorder (HCC)   . Hypertension   . Post-traumatic stress syndrome     Past Surgical History:  Procedure Laterality Date  . BLADDER SURGERY    . CHOLECYSTECTOMY    . TUBAL LIGATION      Family Psychiatric History: Reviewed.  Family History:  Family History  Problem Relation Age of Onset  . Drug abuse Mother   . Alcohol abuse Maternal Uncle   . Drug abuse Maternal Uncle     Social History:  Social History   Socioeconomic History  . Marital status: Single    Spouse name: Not on file  . Number of children: 2  . Years of education: 24  . Highest education level: Not on file  Occupational History  . Not on file  Tobacco Use  . Smoking status: Current Every Day Smoker    Packs/day: 0.50    Types: Cigarettes    Last attempt to quit: 08/29/2016    Years since quitting: 4.2  . Smokeless tobacco: Never Used  Vaping Use  . Vaping Use: Never used  Substance and Sexual Activity  . Alcohol use: No  . Drug use: Not Currently    Types: Marijuana  . Sexual activity: Never    Birth control/protection: Surgical  Other Topics Concern  . Not on file  Social History Narrative   Pt lives with her son in [place]. Pt works at [employer]. Born and raised in [place] by mom until 7 then by grandparents. Pt has one older brother. 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 Never married and has 2 kids.    Social Determinants of Health   Financial Resource Strain: Not on file  Food Insecurity: Not on file  Transportation Needs: Not on file  Physical Activity: Not on file  Stress: Not on file  Social Connections: Not on file    Allergies: No Known Allergies  Metabolic Disorder Labs: Lab Results  Component Value Date   HGBA1C 6.7 (H) 09/12/2020   No results found for: PROLACTIN Lab Results  Component Value Date   CHOL 151 09/12/2020   TRIG 357 (H) 09/12/2020   HDL 43 09/12/2020   CHOLHDL 3.5 09/12/2020   LDLCALC 54 09/12/2020   LDLCALC 64 09/28/2019   Lab Results  Component Value Date   TSH 0.643 01/14/2020    Therapeutic Level Labs: No results found for: LITHIUM No results found for: VALPROATE No components found for:  CBMZ  Current Medications: Current Outpatient Medications  Medication Sig Dispense Refill  . ALPRAZolam (XANAX) 1 MG tablet Take 1 tablet (1 mg total) by mouth 3 (three) times daily as needed for anxiety. 90 tablet 2  . atorvastatin (LIPITOR) 10 MG tablet Take 1 tablet (10 mg total) by mouth daily. 30 tablet 11  . cholecalciferol (VITAMIN D) 400 UNITS TABS tablet Take 1,200 Units by mouth 2 (two) times a week. Reported on 11/09/2015    . diclofenac sodium (VOLTAREN) 1 % GEL Apply 2 g topically 4 (four) times daily. 100 g 0  . hydrochlorothiazide (HYDRODIURIL) 25 MG tablet Take 25 mg by mouth daily. Reported on 11/09/2015    . ibuprofen (ADVIL) 600 MG tablet Take 1 tablet (600 mg total) by mouth every 6 (six) hours as needed. 30 tablet 0  . metFORMIN (GLUCOPHAGE XR) 750 MG 24 hr tablet Take 1 tablet (750 mg total) by mouth daily with breakfast. 90 tablet 1  . methocarbamol (ROBAXIN) 500 MG tablet Take 1 tablet (500 mg total) by mouth 2 (two) times daily. 20 tablet 0  . metoprolol (LOPRESSOR) 50 MG tablet Take 50 mg by mouth 2 (two) times daily.    . potassium  chloride SA (KLOR-CON) 20 MEQ tablet Take 1 tablet (20 mEq total) by mouth daily. 90 tablet 3  . QUEtiapine (SEROQUEL) 400 MG tablet Take 1.5 tablets (600 mg total) by mouth at bedtime. 45 tablet 3   No current facility-administered medications for this visit.      Psychiatric Specialty Exam: Review of Systems  Psychiatric/Behavioral: The patient is nervous/anxious.   All other systems reviewed and are negative.   Last menstrual period 10/12/2011.There is no height or weight on file to calculate BMI.  General Appearance: NA  Eye Contact:  NA  Speech:  Clear and Coherent and Normal Rate  Volume:  Normal  Mood:  Anxious  Affect:  NA  Thought Process:  Goal Directed and Linear  Orientation:  Full (Time, Place, and Person)  Thought Content: Logical   Suicidal Thoughts:  No  Homicidal Thoughts:  No  Memory:  Immediate;   Good Recent;   Good Remote;   Good  Judgement:  Good  Insight:  Good  Psychomotor Activity:  NA  Concentration:  Concentration: Good  Recall:  Good  Fund of Knowledge: Good  Language: Good  Akathisia:  Negative  Handed:  Right  AIMS (if indicated): not done  Assets:  Communication Skills Desire for Improvement Financial Resources/Insurance Housing Resilience Talents/Skills  ADL's:  Intact  Cognition: WNL  Sleep:  Good   Screenings: AIMS   Flowsheet Row [facility]  AIMS Total Score 0       Assessment and Plan:    This patient carries a past diagnosis of bipolar disorder.  I am not clear this diagnosis.  I shared with her that we are in continuing a diagnostic process but at this time I suspect that she actually does not have bipolar disorder.  She clearly does not have symptoms of generalized anxiety disorder.  I think her anxiety is situational consistent with an adjustment disorder with an anxious mood state.  It is concerning the patient has never really made a reasonable  effort in psychotherapy.  This patient has had a problem adjusting to a new psychiatric provider.  She has been taking Seroquel for 4 years and Xanax as well.  At this time I am chosen not to change any of her medicines.  I chosen to see her again in a few months and discussed her diagnosis.  Today in share with her that Seroquel is not a benign simple medicine.  It had significant risks including the possibility of tardive dyskinesia.  Over the last year or so she attempted to reduce her Seroquel to 400 mg but she could not sleep well.  Importantly she does say that when she does not have her Seroquel and her marijuana she finds herself racing in her thinking.  We will take a closer look at the possibility of bipolar disorder on her next visit.  The only feature that she describes of racing thinking is the only thing that might be consistent with a bipolar process.  But I think with a stable mood state I do not think it is appropriate to make this diagnosis.  Her next visit we will also talk about the importance of psychotherapy.  I want to first establish a relationship.  For now she will continue Seroquel 600 mg and Xanax 1 mg 3 times daily. Dx: Bipolar 1 depressed mild; GAD; PTSD chronic; hypertriglyceridemia  Plan: Increase  Seroquel  to 600 mg nighttime dose,alprazolam1 mg tidprn anxiety. Recommend patient taper off the alprazolam, she is not interested at this time. Sugar will ask her PCP at her next visit to have lipid panel rechecked. Next appointment in 2 months.The plan was discussed with patient who had an opportunity to ask questions and these were all answered. I spend53minutes inphone consultation with thepatient. I spent 20 minutes in chart review.   Gypsy Balsam, MD 11/14/2020, 3:46 PM

## 2021-01-30 ENCOUNTER — Other Ambulatory Visit: Payer: Self-pay

## 2021-01-30 ENCOUNTER — Ambulatory Visit (INDEPENDENT_AMBULATORY_CARE_PROVIDER_SITE_OTHER): Payer: 59 | Admitting: Psychiatry

## 2021-01-30 DIAGNOSIS — F4323 Adjustment disorder with mixed anxiety and depressed mood: Secondary | ICD-10-CM | POA: Diagnosis not present

## 2021-01-30 MED ORDER — ALPRAZOLAM 1 MG PO TABS: 1.0000 mg | ORAL_TABLET | Freq: Three times a day (TID) | ORAL | 2 refills | Status: DC | PRN

## 2021-01-30 MED ORDER — QUETIAPINE FUMARATE 300 MG PO TABS: ORAL_TABLET | ORAL | 1 refills | Status: DC

## 2021-01-30 NOTE — Progress Notes (Signed)
BH MD/PA/NP OP Progress Note  11/14/2020 4:34 PM Jill [name]  MRN:  [id number]   Today the patient is at her baseline.  We had a long talk about the issues of her diagnosis.  There is some subtle evidence of bipolar disorder but it is not chronic.  Essentially she describes symptoms of being in a state of racing thinking.  Importantly as she is really reduced her marijuana to only a few times a week.  She says really what helps her racing thinking is for 600 mg of Seroquel.  Her grandmother nor her mother have really been diagnosed with bipolar disorder but they apparently did have depression.  She has a brother being treated for depression.  In another close evaluation I find little evidence of a euphoric form of bipolar disorder.  It is even hard really to define a state of persistent irritability or lability.  The patient clearly is frustrated with her life.  When she lost her apartment her best friend allow her to move in and she stayed with her for a good period of time.  Her son and daughter-in-law then insisted that she move in with them and they obtained a 2 bedroom apartment.  All they have no problem with her being there she has problems living with them.  She feels like she is intruding upon them.  She wants to be independent but financially it is just about impossible.  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  She says all she does is work, come home and sleeps.  Her life is very repetitive.  In her late 81s she did describe periods of what sound like major depression.  She was persistently depressed could not sleep lost her appetite.  It went on for episodes of months and months but then abated.  However her state of being depressed continued.  Once again there was little evidence of mania.  In close evaluation I did not find evidence supporting generalized anxiety disorder.  The biggest intervention we did today was to talk about the only way to change her life is to make  a plan and to enter into psychotherapy.  She has never been in therapy before and we will get her to start with one of the therapists here.  For now she will continue taking high doses of Seroquel 300 mg 2 at night.  Should continue a significant dose of Xanax 1 mg 3 times daily which she has been on for decades.  We talked about the potential dangers of Seroquel and my intent to change her to a different medication.  The possibility of changing her to Abilify 10 mg will be considered at her next visit.  The patient says that she has never weighed so much as she weighs now.  I shared that this might be a reflection of long-term use of Seroquel.       Visit Diagnosis:  No diagnosis found.  Past Psychiatric History: Please see intake H&P.  Past Medical History:  Past Medical History:  Diagnosis Date   Anxiety    Bipolar 1 disorder (HCC)    Hypertension    Post-traumatic stress syndrome     Past Surgical History:  Procedure Laterality Date   BLADDER SURGERY     CHOLECYSTECTOMY     TUBAL LIGATION      Family Psychiatric History: Reviewed.  Family History:  Family History  Problem Relation Age of Onset   Drug abuse Mother  Alcohol abuse Maternal Uncle    Drug abuse Maternal Uncle     Social History:  Social History   Socioeconomic History   Marital status: Single    Spouse name: Not on file   Number of children: 2   Years of education: 14   Highest education level: Not on file  Occupational History   Not on file  Tobacco Use   Smoking status: Every Day    Packs/day: 0.50    Pack years: 0.00    Types: Cigarettes    Last attempt to quit: 08/29/2016    Years since quitting: 4.4   Smokeless tobacco: Never  Vaping Use   Vaping Use: Never used  Substance and Sexual Activity   Alcohol use: No   Drug use: Not Currently    Types: Marijuana   Sexual activity: Never    Birth control/protection: Surgical  Other Topics Concern   Not on file  Social History Narrative   Pt  lives with her son in [place]. Pt works at [employer]. Born and raised in [place] by mom until 7 then by grandparents. Pt has one older brother. 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    Social Determinants of Health   Financial Resource Strain: Not on file  Food Insecurity: Not on file  Transportation Needs: Not on file  Physical Activity: Not on file  Stress: Not on file  Social Connections: Not on file    Allergies: No Known Allergies  Metabolic Disorder Labs: Lab Results  Component Value Date   HGBA1C 6.7 (H) 09/12/2020   No results found for: PROLACTIN Lab Results  Component Value Date   CHOL 151 09/12/2020   TRIG 357 (H) 09/12/2020   HDL 43 09/12/2020   CHOLHDL 3.5 09/12/2020   LDLCALC 54 09/12/2020   LDLCALC 64 09/28/2019   Lab Results  Component Value Date   TSH 0.643 01/14/2020    Therapeutic Level Labs: No results found for: LITHIUM No results found for: VALPROATE No components found for:  CBMZ  Current Medications: Current Outpatient Medications  Medication Sig Dispense Refill   ALPRAZolam (XANAX) 1 MG tablet Take 1 tablet (1 mg total) by mouth 3 (three) times daily as needed for anxiety. 90 tablet 2   atorvastatin (LIPITOR) 10 MG tablet Take 1 tablet (10 mg total) by mouth daily. 30 tablet 11   cholecalciferol (VITAMIN D) 400 UNITS TABS tablet Take 1,200 Units by mouth 2 (two) times a week. Reported on 11/09/2015     diclofenac sodium (VOLTAREN) 1 % GEL Apply 2 g topically 4 (four) times daily. 100 g 0   hydrochlorothiazide (HYDRODIURIL) 25 MG tablet Take 25 mg by mouth daily. Reported on 11/09/2015     ibuprofen (ADVIL) 600 MG tablet Take 1 tablet (600 mg total) by mouth every 6 (six) hours as needed. 30 tablet 0   metFORMIN (GLUCOPHAGE XR) 750 MG 24 hr tablet Take 1 tablet (750 mg total) by mouth daily with breakfast. 90 tablet 1   methocarbamol (ROBAXIN) 500 MG tablet Take 1 tablet (500 mg total) by mouth  2 (two) times daily. 20 tablet 0   metoprolol (LOPRESSOR) 50 MG tablet Take 50 mg by mouth 2 (two) times daily.     potassium chloride SA (KLOR-CON) 20 MEQ tablet Take 1 tablet (20 mEq total) by mouth daily. 90 tablet 3   QUEtiapine (SEROQUEL) 400 MG tablet Take 1.5 tablets (600 mg total) by  mouth at bedtime. 45 tablet 3   No current facility-administered medications for this visit.      Psychiatric Specialty Exam: Review of Systems  Psychiatric/Behavioral:  The patient is nervous/anxious.   All other systems reviewed and are negative.   Last menstrual period 10/12/2011.There is no height or weight on file to calculate BMI.  General Appearance: NA  Eye Contact:  NA  Speech:  Clear and Coherent and Normal Rate  Volume:  Normal  Mood:  Anxious  Affect:  NA  Thought Process:  Goal Directed and Linear  Orientation:  Full (Time, Place, and Person)  Thought Content: Logical   Suicidal Thoughts:  No  Homicidal Thoughts:  No  Memory:  Immediate;   Good Recent;   Good Remote;   Good  Judgement:  Good  Insight:  Good  Psychomotor Activity:  NA  Concentration:  Concentration: Good  Recall:  Good  Fund of Knowledge: Good  Language: Good  Akathisia:  Negative  Handed:  Right  AIMS (if indicated): not done  Assets:  Communication Skills Desire for Improvement Financial Resources/Insurance Housing Resilience Talents/Skills  ADL's:  Intact  Cognition: WNL  Sleep:  Good   Screenings: AIMS    Flowsheet Row [facility]  AIMS Total Score 0        Assessment and Plan:    At this time I believe the patient would be diagnosed with an atypical form of bipolar disorder characterized by racing thinking at night.  Her diagnosis would also be an adjustment disorder with an anxious mood state is indicated need for his Xanax 1 mg 3 times daily.  I am interested in changing her to a safer form of a neuroleptic or  perhaps to discontinue it completely.  The possibility of using Abilify or perhaps Depakote will be discussed at her next visit.  Importantly the patient will be scheduled for that appointment with a therapist.   Gypsy Balsam, MD 11/14/2020, 4:34 PM

## 2021-01-31 ENCOUNTER — Other Ambulatory Visit (HOSPITAL_COMMUNITY): Payer: Self-pay | Admitting: *Deleted

## 2021-01-31 MED ORDER — QUETIAPINE FUMARATE 300 MG PO TABS: ORAL_TABLET | ORAL | 0 refills | Status: DC

## 2021-02-08 ENCOUNTER — Ambulatory Visit (HOSPITAL_COMMUNITY): Payer: 59 | Admitting: Licensed Clinical Social Worker

## 2021-02-12 ENCOUNTER — Other Ambulatory Visit: Payer: Self-pay

## 2021-02-12 ENCOUNTER — Ambulatory Visit (INDEPENDENT_AMBULATORY_CARE_PROVIDER_SITE_OTHER): Payer: 59 | Admitting: Licensed Clinical Social Worker

## 2021-02-12 DIAGNOSIS — F4310 Post-traumatic stress disorder, unspecified: Secondary | ICD-10-CM | POA: Diagnosis not present

## 2021-02-12 DIAGNOSIS — F331 Major depressive disorder, recurrent, moderate: Secondary | ICD-10-CM

## 2021-02-12 DIAGNOSIS — F411 Generalized anxiety disorder: Secondary | ICD-10-CM | POA: Diagnosis not present
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
## 2021-02-26 ENCOUNTER — Telehealth (HOSPITAL_COMMUNITY): Payer: 59 | Admitting: Licensed Clinical Social Worker

## 2021-02-26 ENCOUNTER — Other Ambulatory Visit: Payer: Self-pay

## 2021-02-26 ENCOUNTER — Ambulatory Visit (INDEPENDENT_AMBULATORY_CARE_PROVIDER_SITE_OTHER): Payer: 59 | Admitting: Licensed Clinical Social Worker

## 2021-02-26 DIAGNOSIS — F411 Generalized anxiety disorder: Secondary | ICD-10-CM | POA: Diagnosis not present

## 2021-02-26 DIAGNOSIS — F331 Major depressive disorder, recurrent, moderate: Secondary | ICD-10-CM

## 2021-02-26 DIAGNOSIS — F4310 Post-traumatic stress disorder, unspecified: Secondary | ICD-10-CM

## 2021-02-26 NOTE — Progress Notes (Signed)
Virtual Visit via Video Note   I connected with Jill [name] [name] on 02/26/21 at 10:00am by video enabled telemedicine application and verified that I am speaking with the correct person using two identifiers.   I discussed the limitations, risks, security and privacy concerns of performing an evaluation and management service by video and the availability of in person appointments. I also discussed with the patient that there may be a patient responsible charge related to this service. The patient expressed understanding and agreed to proceed.   I discussed the assessment and treatment plan with the patient. The patient was provided an opportunity to ask questions and all were answered. The patient agreed with the plan and demonstrated an understanding of the instructions.   The patient was advised to call back or seek an in-person evaluation if the symptoms worsen or if the condition fails to improve as anticipated.   I provided 38 minutes of non-face-to-face time during this encounter.     Shade Flood, LCSW, LCAS ________________________________ THERAPIST PROGRESS NOTE   Session Time: 10:00am - 10:38am    Location: Patient: Patient Home Provider: [facility]    Participation Level: Active   Behavioral Response: Alert, casually dressed, anxious mood/affect   Type of Therapy:  Individual Therapy   Treatment Goals addressed: Depression, Panic Attack, and Anxiety management; Medication Compliance   Interventions: CBT, 5-4-3-2-1 grounding technique   Summary: Jill [name] [name] is a single 55 year old AA female that presented for therapy session today with diagnoses of Major Depressive Disorder, recurrent, moderate; Generalized Anxiety Disorder, and PTSD.         Suicidal/Homicidal: None; without plan or intent.    Therapist Response: Clinician met with Jill [name] [name] for virtual session and assessed for safety, sobriety, and medication compliance.  Jill [name] [name] presented for today's appointment on  time and was alert, oriented x5, with no evidence or self-report of active SI/HI or A/V H.  Jill [name] [name] reported that she continues taking medication responsibly and denied any use of alcohol or illicit substances.  Clinician inquired about Jill [name] [name]'s current emotional ratings, as well as any significant changes in thoughts, feelings, or behavior since completion of assessment.  Jill [name] [name] reported scores of 0/10 for depression, 3/10 for anxiety, and 0/10 for anger/irritability. Jill [name] [name] reported that she did experience a panic attack last Friday.  Clinician inquired about what triggered this event, and whether Jill [name] [name] had any available coping skills to manage the episode.  Jill [name] [name] reported that she was asleep and woke up to her heart racing, with sweat pouring down, stating "I dunno if it was a dream or what, but felt very anxious".  Upon further discussion, Jill [name] [name] reported that she has been very stressed out due to two individuals she works with, although they have since been fired.  She reported that she has tried to improve self-care by buying two new books to read in her free time as a distraction.  Clinician provided psychoeducation on panic attacks with Mccartney today, including common physical symptoms (i.e. sweating, racing heart, shakiness, trouble breathing, etc) she might experience, external/internal triggers to be mindful of which could influence these episodes, and strategies for intervention, including grounding for temporary distraction.  Clinician offered to teach Jill [name] [name] the 5-4-3-2-1 grounding technique today in session to help temporarily distract her from troubling thoughts and/or feelings temporarily when exposed to panic triggers in the future.  Clinician guided her through process of scanning her surrounding environment and identifying 5 things she could see, 4 things she could touch, 3  things she could hear, 2 she could smell, and 1 thing she could taste.  Intervention was effective, as evidenced by Jill [name] [name]  actively participating in exercise while in session and reporting that she felt this could be helpful for dealing with future episodes.  She stated "I learned something new today that will help me when I'm stressed out".  Clinician emailed her instructions on how to practice this technique on her own, and will continue to monitor.      Plan: Follow up again in 2 weeks virtually.   Diagnosis: Major Depressive Disorder, recurrent, moderate; Generalized Anxiety Disorder, and PTSD.   Shade Flood, LCSW, LCAS 02/26/21

## 2021-03-12 ENCOUNTER — Telehealth (HOSPITAL_COMMUNITY): Payer: 59 | Admitting: Licensed Clinical Social Worker

## 2021-03-12 ENCOUNTER — Ambulatory Visit (INDEPENDENT_AMBULATORY_CARE_PROVIDER_SITE_OTHER): Payer: 59 | Admitting: Licensed Clinical Social Worker

## 2021-03-12 ENCOUNTER — Other Ambulatory Visit: Payer: Self-pay

## 2021-03-12 DIAGNOSIS — F411 Generalized anxiety disorder: Secondary | ICD-10-CM | POA: Diagnosis not present

## 2021-03-12 DIAGNOSIS — F331 Major depressive disorder, recurrent, moderate: Secondary | ICD-10-CM

## 2021-03-12 DIAGNOSIS — F4310 Post-traumatic stress disorder, unspecified: Secondary | ICD-10-CM

## 2021-03-12 NOTE — Progress Notes (Signed)
Virtual Visit via Video Note   I connected with Jill [name] on 03/12/21 at 10:00am by video enabled telemedicine application and verified that I am speaking with the correct person using two identifiers.   I discussed the limitations, risks, security and privacy concerns of performing an evaluation and management service by video and the availability of in person appointments. I also discussed with the patient that there may be a patient responsible charge related to this service. The patient expressed understanding and agreed to proceed.   I discussed the assessment and treatment plan with the patient. The patient was provided an opportunity to ask questions and all were answered. The patient agreed with the plan and demonstrated an understanding of the instructions.   The patient was advised to call back or seek an in-person evaluation if the symptoms worsen or if the condition fails to improve as anticipated.   I provided 45 minutes of non-face-to-face time during this encounter.     Jill Flood, LCSW, LCAS ________________________________ THERAPIST PROGRESS NOTE   Session Time: 10:00am - 10:45am   Location: Patient: Patient Home Provider: [facility]    Participation Level: Active   Behavioral Response: Alert, casually dressed, anxious mood/affect   Type of Therapy:  Individual Therapy   Treatment Goals addressed: Depression, Panic Attack, and Anxiety management; Medication Compliance; Trauma referral    Interventions: CBT, soothing grounding techniques   Summary: Jill [name] is a single 55 year old AA female that presented for therapy session today with diagnoses of Major Depressive Disorder, recurrent, moderate; Generalized Anxiety Disorder, and PTSD.         Suicidal/Homicidal: None; without plan or intent.    Therapist Response: Clinician met with Jill [name] for virtual appointment and assessed for safety, sobriety, and medication compliance.  Jill [name] presented for  today's session on time and was alert, oriented x5, with no evidence or self-report of active SI/HI or A/V H.  Jill [name] reported ongoing compliance with medication and denied any use of alcohol or illicit substances.  Clinician inquired about Savvy's emotional ratings today, as well as any significant changes in thoughts, feelings, or behavior since last check-in.  Jill [name] reported scores of 3/10 for depression, 4/10 for anxiety, and 0/10 for anger/irritability. Jill [name] reported that she has experienced several panic attacks recently, with most recent occurring yesterday.  Clinician inquired about what triggered this episode and how she attempted to cope.  Jill [name] reported that she has been ruminating more often upon trauma from childhood, stating "This is around the anniversary of when it happened.  If I dream about it, I'll wake up with a panic attacks".  Clinician reminded Jill [name] of previously set goal of considering referral to [facility] should trauma symptoms continue to be an issue.  Jill [name] reported that she would be interested in getting qualified help for this now, so clinician encouraged her to outreach insurance today about providers trained in area of trauma, which she was agreeable to.  Jill [name] reported that she did utilize the 5-4-3-2-1 grounding technique from previous session to intervene effectively during most recent panic episode.  Clinician suggested continuing discussion on grounding today with Jill [name] based upon recent success with this technique.  Clinician covered category of self-soothing grounding techniques with her today, including examples for practice such as saying kind or coping statements, thinking of favorite things, picturing people she cares about, recalling a positive poem, song, or quotation, planning a safe treat, or something to look forward to soon such as visiting the park.  Intervention was effective, as evidenced by Jill [name] engaging in discussion on the subject and actively trying some  of these techniques out, noting preference for reciting coping statements such as "I can handle this, I've handled it before", "This too shall pass", and "I can learn from this so next time it will be easier", in addition to thinking about her favorite season (Fall), recalling positive family memories through pictures, and reciting some of her son's spoken word poetry.  Jill [name] stated "This definitely gave me a few more coping skills to work with".  Clinician will continue to monitor.      Plan: Follow up again in 2 weeks virtually.   Diagnosis: Major Depressive Disorder, recurrent, moderate; Generalized Anxiety Disorder, and PTSD.   Jill Flood, LCSW, LCAS 03/12/21

## 2021-03-26 ENCOUNTER — Other Ambulatory Visit: Payer: Self-pay

## 2021-03-26 ENCOUNTER — Ambulatory Visit (HOSPITAL_COMMUNITY): Payer: 59 | Admitting: Licensed Clinical Social Worker

## 2021-03-26 ENCOUNTER — Telehealth (HOSPITAL_COMMUNITY): Payer: Self-pay | Admitting: Licensed Clinical Social Worker

## 2021-03-26 ENCOUNTER — Telehealth (HOSPITAL_COMMUNITY): Payer: 59 | Admitting: Licensed Clinical Social Worker

## 2021-03-26 NOTE — Telephone Encounter (Signed)
Jill [name] had a therapy session scheduled today at 10am by phone.  Clinician outreached her at scheduled time as requested, and assessed for safety, sobriety, and medication compliance.  Jill [name] answered phone call on time, and spoke in a manner that was alert, oriented x5, with no evidence or self-report of SI/HI or A/V H.  Jill [name] reported compliance with medication.  She reported that she was feeling physically sick this morning due to a tea she drank, and frequently running to bathroom.  Clinician and Daly agreed to cancel today's appointment due to her present illness, and informed front desk staff.  Clinician encouraged her to seek medical assistance if condition doesn't improve, and will continue to monitor.    Noralee Stain, LCSW, LCAS 03/26/21

## 2021-04-09 ENCOUNTER — Telehealth (HOSPITAL_COMMUNITY): Payer: 59 | Admitting: Licensed Clinical Social Worker

## 2021-04-09 ENCOUNTER — Ambulatory Visit (INDEPENDENT_AMBULATORY_CARE_PROVIDER_SITE_OTHER): Payer: 59 | Admitting: Licensed Clinical Social Worker

## 2021-04-09 ENCOUNTER — Other Ambulatory Visit: Payer: Self-pay

## 2021-04-09 DIAGNOSIS — F4310 Post-traumatic stress disorder, unspecified: Secondary | ICD-10-CM | POA: Diagnosis not present

## 2021-04-09 DIAGNOSIS — F331 Major depressive disorder, recurrent, moderate: Secondary | ICD-10-CM | POA: Diagnosis not present

## 2021-04-09 DIAGNOSIS — F411 Generalized anxiety disorder: Secondary | ICD-10-CM | POA: Diagnosis not present

## 2021-04-09 NOTE — Progress Notes (Signed)
Virtual Visit via Video Note   I connected with Jill [name] on 04/09/21 at 10:00am by video enabled telemedicine application and verified that I am speaking with the correct person using two identifiers.   I discussed the limitations, risks, security and privacy concerns of performing an evaluation and management service by video and the availability of in person appointments. I also discussed with the patient that there may be a patient responsible charge related to this service. The patient expressed understanding and agreed to proceed.   I discussed the assessment and treatment plan with the patient. The patient was provided an opportunity to ask questions and all were answered. The patient agreed with the plan and demonstrated an understanding of the instructions.   The patient was advised to call back or seek an in-person evaluation if the symptoms worsen or if the condition fails to improve as anticipated.   I provided 30 minutes of non-face-to-face time during this encounter.     Shade Flood, LCSW, LCAS ________________________________ THERAPIST PROGRESS NOTE   Session Time: 10:00am - 10:30am    Location: Patient: Patient Home Provider: [facility]    Participation Level: Active   Behavioral Response: Alert, casually dressed, anxious mood/affect    Type of Therapy:  Individual Therapy   Treatment Goals addressed: Depression, Panic Attack, and Anxiety management; Medication Compliance   Interventions: CBT, physical grounding techniques    Summary: Jill [name] is a single 55 year old AA female that presented for therapy session today with diagnoses of Major Depressive Disorder, recurrent, moderate; Generalized Anxiety Disorder, and PTSD.         Suicidal/Homicidal: None; without plan or intent.    Therapist Response: Clinician met with Jill [name] for virtual session and assessed for safety, sobriety, and medication compliance.  Jill [name] presented for today's appointment  on time and was alert, oriented x5, with no evidence or self-report of active SI/HI or A/V H.  Jill [name] reported that she continues taking medication as prescribed and denied any use of alcohol or illicit substances.  Clinician inquired about Jill [name]'s current emotional ratings, as well as any significant changes in thoughts, feelings, or behavior since previous check-in.  Jill [name] reported scores of 2/10 for depression, 3/10 for anxiety, and 0/10 for anger/irritability. Jill [name] reported that she has experienced a few panic attacks over past weeks, but noted that these are less severe due to use of grounding techniques.  She reported that for self-care she went out to eat and attended a birthday party with a friend, stating "I had a good time and I wasn't too anxious".  Clinician suggested discussion on additional grounding techniques that she could use to manage ongoing panic attacks, and Evelisse was agreeable to this.  Clinician discussed category of physical grounding techniques with Raizy today which she can use in order to temporarily distract from distressing thoughts and feelings that might influence panic episodes, including examples such as focusing on her breathing, using a variety of grounding objects to touch such as smooth stones, a ring, or soft fabric, stretching, running cool or warm water over her skin, and more.  Intervention was effective, as evidenced by Jill [name] participating in discussion on the subject, and expressing receptiveness to several of these techniques, including using cold water or ice ton her skin, getting a 'worry stone' to keep in her pocket, focusing on a relaxing breathing rhythm, and starting a yoga routine.  She stated "This was very helpful because you've given me more tools to help deescalate  when I'm upset or frustrated or stressed out".  Clinician encouraged her to practice these regularly and will continue to monitor.      Plan: Follow up again in 2 weeks virtually.    Diagnosis: Major Depressive Disorder, recurrent, moderate; Generalized Anxiety Disorder, and PTSD.   Shade Flood, LCSW, LCAS 04/09/21

## 2021-04-17 ENCOUNTER — Emergency Department (HOSPITAL_COMMUNITY)
Admission: EM | Admit: 2021-04-17 | Discharge: 2021-04-17 | Disposition: A | Discharge status: Home / Self Care | Payer: 59 | Attending: Emergency Medicine | Admitting: Emergency Medicine

## 2021-04-17 ENCOUNTER — Emergency Department (HOSPITAL_COMMUNITY): Payer: 59

## 2021-04-17 ENCOUNTER — Encounter (HOSPITAL_COMMUNITY): Payer: Self-pay | Admitting: Emergency Medicine

## 2021-04-17 DIAGNOSIS — F17210 Nicotine dependence, cigarettes, uncomplicated: Secondary | ICD-10-CM | POA: Insufficient documentation

## 2021-04-17 DIAGNOSIS — M25561 Pain in right knee: Secondary | ICD-10-CM | POA: Diagnosis not present

## 2021-04-17 DIAGNOSIS — I10 Essential (primary) hypertension: Secondary | ICD-10-CM | POA: Insufficient documentation

## 2021-04-17 DIAGNOSIS — Z79899 Other long term (current) drug therapy: Secondary | ICD-10-CM | POA: Diagnosis not present

## 2021-04-17 MED ORDER — KETOROLAC TROMETHAMINE 60 MG/2ML IM SOLN
15.0000 mg | Freq: Once | INTRAMUSCULAR | Status: AC
  Administered 2021-04-17: 15 mg via INTRAMUSCULAR
  Filled 2021-04-17: qty 2

## 2021-04-17 NOTE — ED Triage Notes (Signed)
Pt states that she has had R sided knee pain after stepping wrong coming down the stairs yesterday. Alert and oriented.

## 2021-04-17 NOTE — ED Provider Notes (Signed)
[facility]-EMERGENCY DEPT Provider Note   CSN: [id number] Arrival date & time: 04/17/21  1011     History Chief Complaint  Patient presents with   Knee Pain    Jill [name] is a 55 y.o. female.  55 yo F with a chief complaint of right knee pain.  Patient was going down a flight of stairs and felt the sudden sharp pain to the medial aspect of the knee.  She has had pain since then bearing weight and ambulating.  Feels that it is a little bit more swollen than typical.  Denies overt trauma.  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  Knee Pain Location:  Knee Time since incident:  2 hours Injury: no   Knee location:  R knee Pain details:    Quality:  Aching   Radiates to:  Does not radiate   Severity:  Moderate   Onset quality:  Gradual   Duration:  2 days   Timing:  Constant   Progression:  Worsening Chronicity:  New Dislocation: no   Prior injury to area:  No Relieved by:  Nothing Worsened by:  Bearing weight, activity and abduction Associated symptoms: no fever       Past Medical History:  Diagnosis Date   Anxiety    Bipolar 1 disorder (HCC)    Hypertension    Post-traumatic stress syndrome     Patient Active Problem List   Diagnosis Date Noted   Bipolar 1 disorder, depressed, mild (HCC) 10/22/2018   Insomnia 11/09/2015   PTSD (post-traumatic stress disorder) 11/09/2015   GAD (generalized anxiety disorder) 11/09/2015    Past Surgical History:  Procedure Laterality Date   BLADDER SURGERY     CHOLECYSTECTOMY     TUBAL LIGATION       OB History   No obstetric history on file.     Family History  Problem Relation Age of Onset   Drug abuse Mother    Alcohol abuse Maternal Uncle    Drug abuse Maternal Uncle     Social History   Tobacco Use   Smoking status: Every Day    Packs/day: 0.50    Types: Cigarettes    Last attempt to quit: 08/29/2016    Years since quitting:  4.6   Smokeless tobacco: Never  Vaping Use   Vaping Use: Never used  Substance Use Topics   Alcohol use: No   Drug use: Not Currently    Types: Marijuana    Home Medications Prior to Admission medications   Medication Sig Start Date End Date Taking? Authorizing Provider  ALPRAZolam Prudy Feeler) 1 MG tablet Take 1 tablet (1 mg total) by mouth 3 (three) times daily as needed for anxiety. 01/30/21 04/30/21  Plovsky, Earvin Hansen, MD  atorvastatin (LIPITOR) 10 MG tablet Take 1 tablet (10 mg total) by mouth daily. 09/13/20 09/13/21  Pucilowski, Roosvelt Maser, MD  cholecalciferol (VITAMIN D) 400 UNITS TABS tablet Take 1,200 Units by mouth 2 (two) times a week. Reported on 11/09/2015    [provider]  diclofenac sodium (VOLTAREN) 1 % GEL Apply 2 g topically 4 (four) times daily. 03/18/19   Jeannie Fend, PA-C  hydrochlorothiazide (HYDRODIURIL) 25 MG tablet Take 25 mg by mouth daily. Reported on 11/09/2015    [provider]  ibuprofen (ADVIL) 600 MG tablet Take 1 tablet (600 mg total) by mouth every 6 (six) hours as needed. 09/15/19   Fayrene Helper,  PA-C  metFORMIN (GLUCOPHAGE XR) 750 MG 24 hr tablet Take 1 tablet (750 mg total) by mouth daily with breakfast. 01/05/20 07/03/20  Pucilowski, Roosvelt Maser, MD  methocarbamol (ROBAXIN) 500 MG tablet Take 1 tablet (500 mg total) by mouth 2 (two) times daily. 02/02/19   Hyman Hopes, Margaux, PA-C  metoprolol (LOPRESSOR) 50 MG tablet Take 50 mg by mouth 2 (two) times daily.    [provider]  potassium chloride SA (KLOR-CON) 20 MEQ tablet Take 1 tablet (20 mEq total) by mouth daily. 04/21/20 07/20/20  Little Ishikawa, MD  QUEtiapine (SEROQUEL) 300 MG tablet Take two tablets (600 mg total) by mouth at bedtime. 01/31/21   Archer Asa, MD    Allergies    Patient has no known allergies.  Review of Systems   Review of Systems  Constitutional:  Negative for chills and fever.  HENT:  Negative for congestion and rhinorrhea.   Eyes:  Negative for redness  and visual disturbance.  Respiratory:  Negative for shortness of breath and wheezing.   Cardiovascular:  Negative for chest pain and palpitations.  Gastrointestinal:  Negative for nausea and vomiting.  Genitourinary:  Negative for dysuria and urgency.  Musculoskeletal:  Positive for arthralgias. Negative for myalgias.  Skin:  Negative for pallor and wound.  Neurological:  Negative for dizziness and headaches.   Physical Exam Updated Vital Signs BP (!) 173/107   Pulse 94   Temp 98.4 F (36.9 C) (Oral)   Resp 18   Ht 5\' 7"  (1.702 m)   Wt 116.6 kg   LMP 10/12/2011   SpO2 100%   BMI 40.25 kg/m   Physical Exam Vitals and nursing note reviewed.  Constitutional:      General: She is not in acute distress.    Appearance: She is well-developed. She is not diaphoretic.  HENT:     Head: Normocephalic and atraumatic.  Eyes:     Pupils: Pupils are equal, round, and reactive to light.  Cardiovascular:     Rate and Rhythm: Normal rate and regular rhythm.     Heart sounds: No murmur heard.   No friction rub. No gallop.  Pulmonary:     Effort: Pulmonary effort is normal.     Breath sounds: No wheezing or rales.  Abdominal:     General: There is no distension.     Palpations: Abdomen is soft.     Tenderness: There is no abdominal tenderness.  Musculoskeletal:        General: Swelling present. No tenderness.     Cervical back: Normal range of motion and neck supple.     Comments: Pain and swelling to the right knee.  No obvious focal area of crepitus.  Able to range with some tenderness.  No obvious ligamentous laxity though difficult due to the pain.  Possible laxity to the medial collateral ligament.  Skin:    General: Skin is warm and dry.  Neurological:     Mental Status: She is alert and oriented to person, place, and time.  Psychiatric:        Behavior: Behavior normal.    ED Results / Procedures / Treatments   Labs (all labs ordered are listed, but only abnormal results  are displayed) Labs Reviewed - No data to display  EKG None  Radiology DG Knee Complete 4 Views Right  Result Date: 04/17/2021 CLINICAL DATA:  Right knee pain after injury 1 day ago EXAM: RIGHT KNEE - COMPLETE 4+ VIEW COMPARISON:  03/18/2019 FINDINGS: No  evidence of acute fracture or dislocation. 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 Small loose bodies are seen posteriorly, unchanged. No significant joint effusion. No soft tissue swelling. IMPRESSION: No acute findings. Moderate tricompartmental osteoarthritis, similar to prior. Electronically Signed   By: Duanne Guess D.O.   On: 04/17/2021 11:38    Procedures Procedures   Medications Ordered in ED Medications  ketorolac (TORADOL) injection 15 mg (15 mg Intramuscular Given 04/17/21 1450)    ED Course  I have reviewed the triage vital signs and the nursing notes.  Pertinent labs & imaging results that were available during my care of the patient were reviewed by me and considered in my medical decision making (see chart for details).    MDM Rules/Calculators/A&P                           55 yo F with a chief complaints of right knee pain.  Plain film viewed by me without fracture.  Difficult exam secondary to pain.  Will place in a knee immobilizer crutches have her follow-up with her family doctor.  3:16 PM:  I have discussed the diagnosis/risks/treatment options with the patient and believe the pt to be eligible for discharge home to follow-up with PCP. We also discussed returning to the ED immediately if new or worsening sx occur. We discussed the sx which are most concerning (e.g., sudden worsening pain, fever, inability to tolerate by mouth) that necessitate immediate return. Medications administered to the patient during their visit and any new prescriptions provided to the patient are listed below.  Medications given during this visit Medications  ketorolac (TORADOL) injection 15 mg (15 mg  Intramuscular Given 04/17/21 1450)     The patient appears reasonably screen and/or stabilized for discharge and I doubt any other medical condition or other [employer] requiring further screening, evaluation, or treatment in the ED at this time prior to discharge.   Final Clinical Impression(s) / ED Diagnoses Final diagnoses:  Acute pain of right knee    Rx / DC Orders ED Discharge Orders     None        Melene Plan, DO 04/17/21 1516

## 2021-04-17 NOTE — ED Notes (Signed)
Ortho called 

## 2021-04-17 NOTE — Progress Notes (Signed)
Orthopedic Tech Progress Note Patient Details:  Jill [name] 09/25/1965 [id number]  Ortho Devices Type of Ortho Device: Knee Immobilizer, Crutches Ortho Device/Splint Location: RLE Ortho Device/Splint Interventions: Ordered, Application, Adjustment   Post Interventions Patient Tolerated: Well Instructions Provided: Adjustment of device  Jill [name] 04/17/2021, 2:57 PM

## 2021-04-17 NOTE — Discharge Instructions (Signed)
Take 4 over the counter ibuprofen tablets 3 times a day or 2 over-the-counter naproxen tablets twice a day for pain. Also take tylenol 1000mg (2 extra strength) four times a day.   Follow up with your doctor in the office, hopefully in about a week for a recheck

## 2021-04-24 ENCOUNTER — Other Ambulatory Visit: Payer: Self-pay

## 2021-04-24 ENCOUNTER — Ambulatory Visit (HOSPITAL_BASED_OUTPATIENT_CLINIC_OR_DEPARTMENT_OTHER): Payer: 59 | Admitting: Psychiatry

## 2021-04-24 DIAGNOSIS — F319 Bipolar disorder, unspecified: Secondary | ICD-10-CM | POA: Diagnosis not present

## 2021-04-24 MED ORDER — QUETIAPINE FUMARATE 300 MG PO TABS: ORAL_TABLET | ORAL | 0 refills | Status: DC

## 2021-04-24 MED ORDER — ALPRAZOLAM 1 MG PO TABS: 1.0000 mg | ORAL_TABLET | Freq: Three times a day (TID) | ORAL | 2 refills | Status: AC | PRN

## 2021-04-24 NOTE — Progress Notes (Signed)
BH MD/PA/NP OP Progress Note  11/14/2020 2:52 PM Jill [name]  MRN:  [id number]    Today the patient is actually doing quite well.  She quit her job.  She has a new job working in Office manager.  Her good friend got on the job.  She starts tomorrow.  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  But nonetheless she is looking forward to it.  Her health is reasonably good.  She is well controlled hypertension.  Financially she is not doing great.  She lives with her daughter and son-in-law.  She gets along very well with them.  The patient actually exercises some.  The patient was started on Seroquel a number of years ago for what was described as racing thinking and possibly bipolar disorder.  She has been on Xanax 1 mg 3 times daily for well over a decade.  Her anxiety actually is pretty good.  She denies daily depression.  She is sleeping and eating fairly well.  Her energy level is good.  By exercising she has lost about 5 pounds.  Patient has no evidence of psychosis.  The patient does smoke marijuana on a relatively regular basis.  She smokes alone and with other people.  She uses no other illicit substances.  She does not drink alcohol.  The patient is in good spirits.  One of the best things that she has done is start a meaningful therapy relationship with her therapist in this setting.  She feels it is quite beneficial and she learned some new tools how to deal with life.  Certainly she made a decision to change jobs which I think ultimately will be a good decision.  Today we had a long talk about the potential dangers of long-term Seroquel.  Given that she is going through a transition with a new job when she returns in 3 months she is agreed with the idea of slowly changing her Seroquel probably to Depakote.  I shared with him that Depakote is less likely to affect her weight but certainly has  no risk because of tardive dyskinesia.  Today she had an aims scale that demonstrated no evidence of tardive dyskinesia.  The patient is functioning well.       Visit Diagnosis:  No diagnosis found.  Past Psychiatric History: Please see intake H&P.  Past Medical History:  Past Medical History:  Diagnosis Date   Anxiety    Bipolar 1 disorder (HCC)    Hypertension    Post-traumatic stress syndrome     Past Surgical History:  Procedure Laterality Date   BLADDER SURGERY     CHOLECYSTECTOMY     TUBAL LIGATION      Family Psychiatric History: Reviewed.  Family History:  Family History  Problem Relation Age of Onset   Drug abuse Mother    Alcohol abuse Maternal Uncle    Drug abuse Maternal Uncle     Social History:  Social History   Socioeconomic History   Marital status: Single    Spouse name: Not on file   Number of children: 2   Years of education: 14   Highest education level: Not on file  Occupational History   Not on file  Tobacco Use   Smoking status: Every Day    Packs/day: 0.50    Types: Cigarettes    Last attempt to quit: 08/29/2016  Years since quitting: 4.6   Smokeless tobacco: Never  Vaping Use   Vaping Use: Never used  Substance and Sexual Activity   Alcohol use: No   Drug use: Not Currently    Types: Marijuana   Sexual activity: Never    Birth control/protection: Surgical  Other Topics Concern   Not on file  Social History Narrative   Pt lives with her son in [place]. Pt works at [employer]. Born and raised in [place] by mom until 7 then by grandparents. Pt has one older brother. 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    Social Determinants of Health   Financial Resource Strain: Not on file  Food Insecurity: Not on file  Transportation Needs: Not on file  Physical Activity: Not on file  Stress: Not on file  Social Connections: Not on file    Allergies: No Known Allergies  Metabolic  Disorder Labs: Lab Results  Component Value Date   HGBA1C 6.7 (H) 09/12/2020   No results found for: PROLACTIN Lab Results  Component Value Date   CHOL 151 09/12/2020   TRIG 357 (H) 09/12/2020   HDL 43 09/12/2020   CHOLHDL 3.5 09/12/2020   LDLCALC 54 09/12/2020   LDLCALC 64 09/28/2019   Lab Results  Component Value Date   TSH 0.643 01/14/2020    Therapeutic Level Labs: No results found for: LITHIUM No results found for: VALPROATE No components found for:  CBMZ  Current Medications: Current Outpatient Medications  Medication Sig Dispense Refill   ALPRAZolam (XANAX) 1 MG tablet Take 1 tablet (1 mg total) by mouth 3 (three) times daily as needed for anxiety. 90 tablet 2   atorvastatin (LIPITOR) 10 MG tablet Take 1 tablet (10 mg total) by mouth daily. 30 tablet 11   cholecalciferol (VITAMIN D) 400 UNITS TABS tablet Take 1,200 Units by mouth 2 (two) times a week. Reported on 11/09/2015     diclofenac sodium (VOLTAREN) 1 % GEL Apply 2 g topically 4 (four) times daily. 100 g 0   hydrochlorothiazide (HYDRODIURIL) 25 MG tablet Take 25 mg by mouth daily. Reported on 11/09/2015     ibuprofen (ADVIL) 600 MG tablet Take 1 tablet (600 mg total) by mouth every 6 (six) hours as needed. 30 tablet 0   metFORMIN (GLUCOPHAGE XR) 750 MG 24 hr tablet Take 1 tablet (750 mg total) by mouth daily with breakfast. 90 tablet 1   methocarbamol (ROBAXIN) 500 MG tablet Take 1 tablet (500 mg total) by mouth 2 (two) times daily. 20 tablet 0   metoprolol (LOPRESSOR) 50 MG tablet Take 50 mg by mouth 2 (two) times daily.     potassium chloride SA (KLOR-CON) 20 MEQ tablet Take 1 tablet (20 mEq total) by mouth daily. 90 tablet 3   QUEtiapine (SEROQUEL) 300 MG tablet Take two tablets (600 mg total) by mouth at bedtime. 30 tablet 0   No current facility-administered medications for this visit.      Psychiatric Specialty Exam: Review of Systems  Psychiatric/Behavioral:  The patient is nervous/anxious.   All  other systems reviewed and are negative.  Last menstrual period 10/12/2011.There is no height or weight on file to calculate BMI.  General Appearance: NA  Eye Contact:  NA  Speech:  Clear and Coherent and Normal Rate  Volume:  Normal  Mood:  Anxious  Affect:  NA  Thought Process:  Goal Directed and Linear  Orientation:  Full (Time, Place,  and Person)  Thought Content: Logical   Suicidal Thoughts:  No  Homicidal Thoughts:  No  Memory:  Immediate;   Good Recent;   Good Remote;   Good  Judgement:  Good  Insight:  Good  Psychomotor Activity:  NA  Concentration:  Concentration: Good  Recall:  Good  Fund of Knowledge: Good  Language: Good  Akathisia:  Negative  Handed:  Right  AIMS (if indicated): not done  Assets:  Communication Skills Desire for Improvement Financial Resources/Insurance Housing Resilience Talents/Skills  ADL's:  Intact  Cognition: WNL  Sleep:  Good   Screenings: AIMS    Flowsheet Row [facility]  AIMS Total Score 0      GAD-7    Flowsheet Row Counselor from 02/12/2021 in [facility] [place]  Total GAD-7 Score 17      PHQ2-9    Flowsheet Row Counselor from 02/12/2021 in [facility] [place]  PHQ-2 Total Score 2  PHQ-9 Total Score 10      Flowsheet Row ED from 04/17/2021 in [facility]  C-SSRS RISK CATEGORY No Risk        Assessment and Plan:    This patient has been diagnosed with bipolar disorder but I am not clear of this condition.  At this time she will continue taking relatively high dose of Seroquel 300 mg 2 at night.  Her next visit we will likely start reducing the Seroquel and begin her on Depakote.  Her second problem is an adjustment disorder with an anxious mood state.  She did 1 mg of Xanax 3 times daily and takes it appropriately.  I believe the patient is stable at  this time.  She will return to see me in 3 months after starting her new job.   Gypsy Balsam, MD 11/14/2020, 2:52 PM

## 2021-04-26 ENCOUNTER — Other Ambulatory Visit (HOSPITAL_COMMUNITY): Payer: Self-pay

## 2021-04-26 MED ORDER — QUETIAPINE FUMARATE 300 MG PO TABS: ORAL_TABLET | ORAL | 2 refills | Status: DC

## 2021-07-10 ENCOUNTER — Other Ambulatory Visit (HOSPITAL_COMMUNITY): Payer: Self-pay | Admitting: *Deleted

## 2021-07-10 MED ORDER — QUETIAPINE FUMARATE 300 MG PO TABS: ORAL_TABLET | ORAL | 0 refills | Status: DC

## 2021-07-17 ENCOUNTER — Other Ambulatory Visit: Payer: Self-pay

## 2021-07-17 ENCOUNTER — Encounter (HOSPITAL_COMMUNITY): Payer: Self-pay

## 2021-07-17 ENCOUNTER — Emergency Department (HOSPITAL_COMMUNITY)
Admission: EM | Admit: 2021-07-17 | Discharge: 2021-07-17 | Disposition: A | Discharge status: Home / Self Care | Payer: 59 | Attending: Emergency Medicine | Admitting: Emergency Medicine

## 2021-07-17 DIAGNOSIS — Z79899 Other long term (current) drug therapy: Secondary | ICD-10-CM | POA: Diagnosis not present

## 2021-07-17 DIAGNOSIS — H66001 Acute suppurative otitis media without spontaneous rupture of ear drum, right ear: Secondary | ICD-10-CM | POA: Insufficient documentation

## 2021-07-17 DIAGNOSIS — H60501 Unspecified acute noninfective otitis externa, right ear: Secondary | ICD-10-CM

## 2021-07-17 DIAGNOSIS — F17210 Nicotine dependence, cigarettes, uncomplicated: Secondary | ICD-10-CM | POA: Insufficient documentation

## 2021-07-17 DIAGNOSIS — H9201 Otalgia, right ear: Secondary | ICD-10-CM | POA: Diagnosis present

## 2021-07-17 DIAGNOSIS — I10 Essential (primary) hypertension: Secondary | ICD-10-CM | POA: Diagnosis not present

## 2021-07-17 DIAGNOSIS — Z7984 Long term (current) use of oral hypoglycemic drugs: Secondary | ICD-10-CM | POA: Insufficient documentation

## 2021-07-17 MED ORDER — AMOXICILLIN 500 MG PO CAPS: 500.0000 mg | ORAL_CAPSULE | Freq: Three times a day (TID) | ORAL | 0 refills | Status: DC

## 2021-07-17 MED ORDER — CIPROFLOXACIN-DEXAMETHASONE 0.3-0.1 % OT SUSP
4.0000 [drp] | Freq: Once | OTIC | Status: AC
  Administered 2021-07-17: 16:00:00 4 [drp] via OTIC
  Filled 2021-07-17: qty 7.5

## 2021-07-17 NOTE — ED Provider Notes (Signed)
[facility]-EMERGENCY DEPT Provider Note   CSN: [id number] Arrival date & time: 07/17/21  1526     History Chief Complaint  Patient presents with   Otalgia    Jill [name] is a 55 y.o. female who presents emergency department with chief complaint of right ear pain.  Complains of worsening right-sided ear pain, drainage, crusting, decreased hearing x2 weeks.  She put some Neosporin around the outside has been placing a cotton ball in her ear to catch the drainage.  He denies pain behind the ear, fever, chills, neck stiffness.  No history of diabetes The history is provided by the patient.  Otalgia Location:  Right Associated symptoms: ear discharge   Associated symptoms: no fever, no headaches and no neck pain       Past Medical History:  Diagnosis Date   Anxiety    Bipolar 1 disorder (HCC)    Hypertension    Post-traumatic stress syndrome     Patient Active Problem List   Diagnosis Date Noted   Bipolar 1 disorder, depressed, mild (HCC) 10/22/2018   Insomnia 11/09/2015   PTSD (post-traumatic stress disorder) 11/09/2015   GAD (generalized anxiety disorder) 11/09/2015    Past Surgical History:  Procedure Laterality Date   BLADDER SURGERY     CHOLECYSTECTOMY     TUBAL LIGATION       OB History   No obstetric history on file.     Family History  Problem Relation Age of Onset   Drug abuse Mother    Alcohol abuse Maternal Uncle    Drug abuse Maternal Uncle     Social History   Tobacco Use   Smoking status: Every Day    Packs/day: 0.50    Types: Cigarettes    Last attempt to quit: 08/29/2016    Years since quitting: 4.8   Smokeless tobacco: Never  Vaping Use   Vaping Use: Never used  Substance Use Topics   Alcohol use: No   Drug use: Not Currently    Types: Marijuana    Home Medications Prior to Admission medications   Medication Sig Start Date End Date Taking? Authorizing Provider  ALPRAZolam Prudy Feeler) 1 MG tablet Take 1  tablet (1 mg total) by mouth 3 (three) times daily as needed for anxiety. 04/24/21 07/23/21  Plovsky, Earvin Hansen, MD  atorvastatin (LIPITOR) 10 MG tablet Take 1 tablet (10 mg total) by mouth daily. 09/13/20 09/13/21  Pucilowski, Roosvelt Maser, MD  cholecalciferol (VITAMIN D) 400 UNITS TABS tablet Take 1,200 Units by mouth 2 (two) times a week. Reported on 11/09/2015    [provider]  diclofenac sodium (VOLTAREN) 1 % GEL Apply 2 g topically 4 (four) times daily. 03/18/19   Jeannie Fend, PA-C  hydrochlorothiazide (HYDRODIURIL) 25 MG tablet Take 25 mg by mouth daily. Reported on 11/09/2015    [provider]  ibuprofen (ADVIL) 600 MG tablet Take 1 tablet (600 mg total) by mouth every 6 (six) hours as needed. 09/15/19   Fayrene Helper, PA-C  metFORMIN (GLUCOPHAGE XR) 750 MG 24 hr tablet Take 1 tablet (750 mg total) by mouth daily with breakfast. 01/05/20 07/03/20  Pucilowski, Roosvelt Maser, MD  methocarbamol (ROBAXIN) 500 MG tablet Take 1 tablet (500 mg total) by mouth 2 (two) times daily. 02/02/19   Hyman Hopes, Margaux, PA-C  metoprolol (LOPRESSOR) 50 MG tablet Take 50 mg by mouth 2 (two) times daily.    [provider]  potassium chloride SA (KLOR-CON) 20 MEQ tablet Take 1 tablet (  20 mEq total) by mouth daily. 04/21/20 07/20/20  Little Ishikawa, MD  QUEtiapine (SEROQUEL) 300 MG tablet Take two tablets (600 mg total) by mouth at bedtime. 07/10/21   Plovsky, Earvin Hansen, MD    Allergies    Aspirin  Review of Systems   Review of Systems  Constitutional:  Negative for chills and fever.  HENT:  Positive for ear discharge and ear pain. Negative for facial swelling.   Musculoskeletal:  Negative for neck pain and neck stiffness.  Neurological:  Negative for headaches.   Physical Exam Updated Vital Signs BP (!) 156/104 (BP Location: Right Arm)    Pulse 87    Temp 98.6 F (37 C) (Oral)    Resp 18    Ht 5\' 7"  (1.702 m)    Wt 113.4 kg    LMP 10/12/2011    SpO2 95%    BMI 39.16 kg/m   Physical  Exam Vitals and nursing note reviewed.  Constitutional:      General: She is not in acute distress.    Appearance: She is well-developed. She is not diaphoretic.  HENT:     Head: Normocephalic and atraumatic.     Right Ear: Decreased hearing noted. Drainage, swelling and tenderness present. A middle ear effusion is present. Tympanic membrane is bulging.     Left Ear: Hearing, tympanic membrane, ear canal and external ear normal.     Nose: Nose normal.     Mouth/Throat:     Mouth: Mucous membranes are moist.  Eyes:     General: No scleral icterus.    Conjunctiva/sclera: Conjunctivae normal.  Cardiovascular:     Rate and Rhythm: Normal rate and regular rhythm.     Heart sounds: Normal heart sounds. No murmur heard.   No friction rub. No gallop.  Pulmonary:     Effort: Pulmonary effort is normal. No respiratory distress.     Breath sounds: Normal breath sounds.  Abdominal:     General: Bowel sounds are normal. There is no distension.     Palpations: Abdomen is soft. There is no mass.     Tenderness: There is no abdominal tenderness. There is no guarding.  Musculoskeletal:     Cervical back: Normal range of motion.  Skin:    General: Skin is warm and dry.  Neurological:     Mental Status: She is alert and oriented to person, place, and time.  Psychiatric:        Behavior: Behavior normal.    ED Results / Procedures / Treatments   Labs (all labs ordered are listed, but only abnormal results are displayed) Labs Reviewed - No data to display  EKG None  Radiology No results found.  Procedures Procedures   Medications Ordered in ED Medications - No data to display  ED Course  I have reviewed the triage vital signs and the nursing notes.  Pertinent labs & imaging results that were available during my care of the patient were reviewed by me and considered in my medical decision making (see chart for details).    MDM Rules/Calculators/A&P                           Patient here with both otitis externa and otitis media.  Patient started on Ciprodex.  Discharged with amoxicillin, close outpatient follow-up.  No evidence of mastoiditis or meningitis discussed outpatient follow-up and return precaution.  Patient appears appropriate for discharge  Final Clinical Impression(s) / ED  Diagnoses Final diagnoses:  Acute otitis externa of right ear, unspecified type  Non-recurrent acute suppurative otitis media of right ear without spontaneous rupture of tympanic membrane    Rx / DC Orders ED Discharge Orders     None        Arthor Captain, PA-C 07/17/21 1615    Lorre Nick, MD 07/18/21 1705

## 2021-07-17 NOTE — ED Notes (Signed)
Pt A&Ox4 ambulatory at d/c with independent steady gait. Pt verbalized understanding of d/c instructions, prescription and follow up care. 

## 2021-07-17 NOTE — Discharge Instructions (Addendum)
Apply 4 drops of Ciprodex into the right ear twice a day for the next 5 to 7 days.  Take your antibiotic as directed Get help right away if: You have severe pain that is not controlled with medicine. You have swelling, redness, or pain around your ear. You have stiffness in your neck. A part of your face is not moving (paralyzed). The bone behind your ear (mastoid bone) is tender when you touch it. You develop a severe headache.

## 2021-07-17 NOTE — ED Triage Notes (Signed)
Pt reports right ear pain and drainage onset 2 weeks ago but the pain worsened over the last 2 days. She states the pain radiates down to her neck.

## 2021-07-24 ENCOUNTER — Ambulatory Visit (HOSPITAL_BASED_OUTPATIENT_CLINIC_OR_DEPARTMENT_OTHER): Payer: 59 | Admitting: Psychiatry

## 2021-07-24 ENCOUNTER — Other Ambulatory Visit: Payer: Self-pay

## 2021-07-24 DIAGNOSIS — F319 Bipolar disorder, unspecified: Secondary | ICD-10-CM | POA: Diagnosis not present

## 2021-07-24 MED ORDER — ALPRAZOLAM 1 MG PO TABS: 1.0000 mg | ORAL_TABLET | Freq: Three times a day (TID) | ORAL | 4 refills | Status: DC

## 2021-07-24 MED ORDER — BUPROPION HCL ER (XL) 150 MG PO TB24: ORAL_TABLET | ORAL | 4 refills | Status: DC

## 2021-07-24 MED ORDER — QUETIAPINE FUMARATE 300 MG PO TABS: ORAL_TABLET | ORAL | 5 refills | Status: DC

## 2021-07-24 NOTE — Progress Notes (Signed)
BH MD/PA/NP OP Progress Note  11/14/2020 3:53 PM Jill [name]  MRN:  [id number]    Today the patient is doing actually very well.  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  She likes the job is okay but she really likes that it is better than her previous job working in Production designer, theatre/television/film.  She likes the people she works with pretty well.  She likes her boss.  It is a very stable job.  Her mood is good.  She denies depression or anxiety.  In the close evaluation we look back about 8 years ago when she experienced what sounds like a clear manic episode.  She sensed a state of being euphoric and elated for over a month.  Her energy level was excessive.  Her self-esteem was normal but she remembers having racing thinking, problems concentrating and a decreased need to sleep.  These episodes have occurred at least 3 times in her life.  But since being placed on Seroquel they have been absent.  She has been well.  She also remembers periods of being clinically depressed and remembers trying Wellbutrin which was successful.  Nonetheless at some point she was taken off all antidepressants and just treated with Seroquel which in some way makes sense.  The patient has been on Xanax 1 mg 3 times daily for well over a decade.  The patient never Smir members being psychotic.  She denies then or now using any alcohol of significance.  She regularly smokes marijuana.  Her weight is 257 pounds.  The patient smokes a pack of cigarettes a day she has high cholesterol which is well controlled with medicines.  She has hypertension which is also well controlled by medicines.  The patient remembers trying Zoloft but it gave her diarrhea.  She never remembers Paxil or Prozac as a treatment.  She does not remember being on Lexapro.  The patient lives with her daughter and her son-in-law.  She likes living with them.  In general she is quite stable.     Visit Diagnosis:  No diagnosis found.  Past Psychiatric History: Please see  intake H&P.  Past Medical History:  Past Medical History:  Diagnosis Date   Anxiety    Bipolar 1 disorder (HCC)    Hypertension    Post-traumatic stress syndrome     Past Surgical History:  Procedure Laterality Date   BLADDER SURGERY     CHOLECYSTECTOMY     TUBAL LIGATION      Family Psychiatric History: Reviewed.  Family History:  Family History  Problem Relation Age of Onset   Drug abuse Mother    Alcohol abuse Maternal Uncle    Drug abuse Maternal Uncle     Social History:  Social History   Socioeconomic History   Marital status: Single    Spouse name: Not on file   Number of children: 2   Years of education: 14   Highest education level: Not on file  Occupational History   Not on file  Tobacco Use   Smoking status: Every Day    Packs/day: 0.50    Types: Cigarettes    Last attempt to quit: 08/29/2016    Years since quitting: 4.9   Smokeless tobacco: Never  Vaping Use   Vaping Use: Never used  Substance and Sexual Activity   Alcohol use: No   Drug use: Not Currently    Types: Marijuana   Sexual activity: Never    Birth control/protection: Surgical  Other Topics  Concern   Not on file  Social History Narrative   Pt lives with her son in [place]. Pt works at [employer]. Born and raised in [place] by mom until 7 then by grandparents. Pt has one older brother. 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    Social Determinants of Health   Financial Resource Strain: Not on file  Food Insecurity: Not on file  Transportation Needs: Not on file  Physical Activity: Not on file  Stress: Not on file  Social Connections: Not on file    Allergies:  Allergies  Allergen Reactions   Aspirin Other (See Comments)    Upset stomach    Metabolic Disorder Labs: Lab Results  Component Value Date   HGBA1C 6.7 (H) 09/12/2020   No results found for: PROLACTIN Lab Results  Component Value Date   CHOL 151 09/12/2020    TRIG 357 (H) 09/12/2020   HDL 43 09/12/2020   CHOLHDL 3.5 09/12/2020   LDLCALC 54 09/12/2020   LDLCALC 64 09/28/2019   Lab Results  Component Value Date   TSH 0.643 01/14/2020    Therapeutic Level Labs: No results found for: LITHIUM No results found for: VALPROATE No components found for:  CBMZ  Current Medications: Current Outpatient Medications  Medication Sig Dispense Refill   ALPRAZolam (XANAX) 1 MG tablet Take 1 tablet (1 mg total) by mouth 3 (three) times daily. 90 tablet 4   buPROPion (WELLBUTRIN XL) 150 MG 24 hr tablet 1  qam  fo  1 week the 2  qam from then on 60 tablet 4   amoxicillin (AMOXIL) 500 MG capsule Take 1 capsule (500 mg total) by mouth 3 (three) times daily. 30 capsule 0   atorvastatin (LIPITOR) 10 MG tablet Take 1 tablet (10 mg total) by mouth daily. 30 tablet 11   cholecalciferol (VITAMIN D) 400 UNITS TABS tablet Take 1,200 Units by mouth 2 (two) times a week. Reported on 11/09/2015     diclofenac sodium (VOLTAREN) 1 % GEL Apply 2 g topically 4 (four) times daily. 100 g 0   hydrochlorothiazide (HYDRODIURIL) 25 MG tablet Take 25 mg by mouth daily. Reported on 11/09/2015     ibuprofen (ADVIL) 600 MG tablet Take 1 tablet (600 mg total) by mouth every 6 (six) hours as needed. 30 tablet 0   metFORMIN (GLUCOPHAGE XR) 750 MG 24 hr tablet Take 1 tablet (750 mg total) by mouth daily with breakfast. 90 tablet 1   methocarbamol (ROBAXIN) 500 MG tablet Take 1 tablet (500 mg total) by mouth 2 (two) times daily. 20 tablet 0   metoprolol (LOPRESSOR) 50 MG tablet Take 50 mg by mouth 2 (two) times daily.     potassium chloride SA (KLOR-CON) 20 MEQ tablet Take 1 tablet (20 mEq total) by mouth daily. 90 tablet 3   QUEtiapine (SEROQUEL) 300 MG tablet Take two tablets (600 mg total) by mouth at bedtime. 30 tablet 5   No current facility-administered medications for this visit.      Psychiatric Specialty Exam: Review of Systems  Psychiatric/Behavioral:  The patient is  nervous/anxious.   All other systems reviewed and are negative.  Last menstrual period 10/12/2011.There is no height or weight on file to calculate BMI.  General Appearance: NA  Eye Contact:  NA  Speech:  Clear and Coherent and Normal Rate  Volume:  Normal  Mood:  Anxious  Affect:  NA  Thought Process:  Goal Directed  and Linear  Orientation:  Full (Time, Place, and Person)  Thought Content: Logical   Suicidal Thoughts:  No  Homicidal Thoughts:  No  Memory:  Immediate;   Good Recent;   Good Remote;   Good  Judgement:  Good  Insight:  Good  Psychomotor Activity:  NA  Concentration:  Concentration: Good  Recall:  Good  Fund of Knowledge: Good  Language: Good  Akathisia:  Negative  Handed:  Right  AIMS (if indicated): not done  Assets:  Communication Skills Desire for Improvement Financial Resources/Insurance Housing Resilience Talents/Skills  ADL's:  Intact  Cognition: WNL  Sleep:  Good   Screenings: AIMS    Flowsheet Row [facility]  AIMS Total Score 0      GAD-7    Flowsheet Row Counselor from 02/12/2021 in [facility] [place]  Total GAD-7 Score 17      PHQ2-9    Flowsheet Row Counselor from 02/12/2021 in [facility] [place]  PHQ-2 Total Score 2  PHQ-9 Total Score 10      Flowsheet Row ED from 07/17/2021 in [facility] ED from 04/17/2021 in [facility]  C-SSRS RISK CATEGORY No Risk No Risk        Assessment and Plan:    This patient is diagnosed with bipolar disorder type I.  At this time she is doing very well on Seroquel 300 mg twice daily.  Today we thoroughly went over the risks of being on Seroquel.  The patient shows no evidence of tardive dyskinesia but she is aware of the risk of having tardive dyskinesia.  She is aware that Seroquel may be contributing  to her weight.  It may also be contributing to the reason she takes medicines for her high cholesterol.  Nonetheless she is clearly informed of all of these risks and still chooses at this time to continue the same dose of Seroquel.  We talked about the option of reducing the Seroquel and adding Depakote over time.  She is resistant to doing that right now.  It is the holidays and for the first time in many years she feels very stable and does not want to take a risk to change her Seroquel.  Her second problem is generalized anxiety disorder.  She will continue taking Xanax 1 mg 3 times daily which she has been on for well over a decade.  This patient she will return to see me in 3-1/2 months.  I believe she continues in therapy in this setting.  Today we are going to go ahead and start her on Wellbutrin and build up to 300 mg.  This is for her third problem which is cigarette addiction.   Gypsy Balsam, MD 11/14/2020, 3:53 PM

## 2021-07-25 ENCOUNTER — Other Ambulatory Visit (HOSPITAL_COMMUNITY): Payer: Self-pay | Admitting: *Deleted

## 2021-07-25 MED ORDER — QUETIAPINE FUMARATE 300 MG PO TABS: ORAL_TABLET | ORAL | 5 refills | Status: DC

## 2021-08-12 ENCOUNTER — Other Ambulatory Visit: Payer: Self-pay

## 2021-08-12 ENCOUNTER — Encounter (HOSPITAL_COMMUNITY): Payer: Self-pay | Admitting: Emergency Medicine

## 2021-08-12 ENCOUNTER — Emergency Department (HOSPITAL_COMMUNITY)
Admission: EM | Admit: 2021-08-12 | Discharge: 2021-08-12 | Disposition: A | Discharge status: Home / Self Care | Payer: 59 | Attending: Emergency Medicine | Admitting: Emergency Medicine

## 2021-08-12 DIAGNOSIS — H65199 Other acute nonsuppurative otitis media, unspecified ear: Secondary | ICD-10-CM | POA: Insufficient documentation

## 2021-08-12 DIAGNOSIS — Z7984 Long term (current) use of oral hypoglycemic drugs: Secondary | ICD-10-CM | POA: Diagnosis not present

## 2021-08-12 DIAGNOSIS — H65192 Other acute nonsuppurative otitis media, left ear: Secondary | ICD-10-CM

## 2021-08-12 DIAGNOSIS — Z79899 Other long term (current) drug therapy: Secondary | ICD-10-CM | POA: Insufficient documentation

## 2021-08-12 DIAGNOSIS — H9202 Otalgia, left ear: Secondary | ICD-10-CM | POA: Diagnosis present

## 2021-08-12 DIAGNOSIS — H6092 Unspecified otitis externa, left ear: Secondary | ICD-10-CM | POA: Insufficient documentation

## 2021-08-12 DIAGNOSIS — H60502 Unspecified acute noninfective otitis externa, left ear: Secondary | ICD-10-CM

## 2021-08-12 MED ORDER — CIPROFLOXACIN-DEXAMETHASONE 0.3-0.1 % OT SUSP: 4.0000 [drp] | Freq: Two times a day (BID) | OTIC | 0 refills | Status: AC

## 2021-08-12 MED ORDER — AMOXICILLIN 500 MG PO CAPS: 500.0000 mg | ORAL_CAPSULE | Freq: Three times a day (TID) | ORAL | 0 refills | Status: DC

## 2021-08-12 NOTE — ED Provider Notes (Signed)
[facility]-EMERGENCY DEPT Provider Note   CSN: [id number] Arrival date & time: 08/12/21  4098     History  Chief Complaint  Patient presents with   Otalgia    Jill [name] is a 56 y.o. female with draining and pain from the left ear. Hx of similar sxs on the right in December. This improved with amoxicilin and ciprodex. Pain has been present in the left ear for 5 days.  She has drainage from the ear.  No headache, neck stiffness.   Otalgia     Home Medications Prior to Admission medications   Medication Sig Start Date End Date Taking? Authorizing Provider  ALPRAZolam Prudy Feeler) 1 MG tablet Take 1 tablet (1 mg total) by mouth 3 (three) times daily. 07/24/21 07/24/22  Plovsky, Earvin Hansen, MD  amoxicillin (AMOXIL) 500 MG capsule Take 1 capsule (500 mg total) by mouth 3 (three) times daily. 07/17/21   Arthor Captain, PA-C  atorvastatin (LIPITOR) 10 MG tablet Take 1 tablet (10 mg total) by mouth daily. 09/13/20 09/13/21  Pucilowski, Roosvelt Maser, MD  buPROPion (WELLBUTRIN XL) 150 MG 24 hr tablet 1  qam  fo  1 week the 2  qam from then on 07/24/21   Archer Asa, MD  cholecalciferol (VITAMIN D) 400 UNITS TABS tablet Take 1,200 Units by mouth 2 (two) times a week. Reported on 11/09/2015    [provider]  diclofenac sodium (VOLTAREN) 1 % GEL Apply 2 g topically 4 (four) times daily. 03/18/19   Jeannie Fend, PA-C  hydrochlorothiazide (HYDRODIURIL) 25 MG tablet Take 25 mg by mouth daily. Reported on 11/09/2015    [provider]  ibuprofen (ADVIL) 600 MG tablet Take 1 tablet (600 mg total) by mouth every 6 (six) hours as needed. 09/15/19   Fayrene Helper, PA-C  metFORMIN (GLUCOPHAGE XR) 750 MG 24 hr tablet Take 1 tablet (750 mg total) by mouth daily with breakfast. 01/05/20 07/03/20  Pucilowski, Roosvelt Maser, MD  methocarbamol (ROBAXIN) 500 MG tablet Take 1 tablet (500 mg total) by mouth 2 (two) times daily. 02/02/19   Hyman Hopes, Margaux, PA-C  metoprolol (LOPRESSOR) 50  MG tablet Take 50 mg by mouth 2 (two) times daily.    [provider]  potassium chloride SA (KLOR-CON) 20 MEQ tablet Take 1 tablet (20 mEq total) by mouth daily. 04/21/20 07/20/20  Little Ishikawa, MD  QUEtiapine (SEROQUEL) 300 MG tablet Take two tablets (600 mg total) by mouth at bedtime. 07/25/21   Archer Asa, MD      Allergies    Aspirin    Review of Systems   Review of Systems  HENT:  Positive for ear pain.    Physical Exam Updated Vital Signs BP 126/83 (BP Location: Left Arm)    Pulse 79    Temp 98.3 F (36.8 C)    Resp 18    LMP 10/12/2011    SpO2 97%  Physical Exam Vitals and nursing note reviewed.  Constitutional:      General: She is not in acute distress.    Appearance: She is well-developed. She is not diaphoretic.  HENT:     Head: Normocephalic and atraumatic.     Right Ear: Tympanic membrane, ear canal and external ear normal.     Ears:     Comments: Left TM bulging and erythematous. Left first canal with mild swelling, copious discharge, crusting at the auricle, anterior auricular tenderness    Nose: Nose normal.     Mouth/Throat:  Mouth: Mucous membranes are moist.  Eyes:     General: No scleral icterus.    Conjunctiva/sclera: Conjunctivae normal.  Cardiovascular:     Rate and Rhythm: Normal rate and regular rhythm.     Heart sounds: Normal heart sounds. No murmur heard.   No friction rub. No gallop.  Pulmonary:     Effort: Pulmonary effort is normal. No respiratory distress.     Breath sounds: Normal breath sounds.  Abdominal:     General: Bowel sounds are normal. There is no distension.     Palpations: Abdomen is soft. There is no mass.     Tenderness: There is no abdominal tenderness. There is no guarding.  Musculoskeletal:     Cervical back: Normal range of motion.  Skin:    General: Skin is warm and dry.  Neurological:     Mental Status: She is alert and oriented to person, place, and time.  Psychiatric:        Behavior:  Behavior normal.    ED Results / Procedures / Treatments   Labs (all labs ordered are listed, but only abnormal results are displayed) Labs Reviewed - No data to display  EKG None  Radiology No results found.  Procedures Procedures    Medications Ordered in ED Medications - No data to display  ED Course/ Medical Decision Making/ A&P                           Medical Decision Making  56 year old female who presents emergency department with ear pain on the left.  She has obvious otitis externa and probable otitis media.  No evidence of mastoiditis or meningitis.  Afebrile.  We will treat with Ciprodex drops and amoxicillin.  Outpatient follow-up discussed.  Patient understands return precautions. Final Clinical Impression(s) / ED Diagnoses Final diagnoses:  Acute otitis externa of left ear, unspecified type  Other non-recurrent acute nonsuppurative otitis media of left ear    Rx / DC Orders ED Discharge Orders     None         Arthor Captain, PA-C 08/12/21 1326    Jacalyn Lefevre, MD 08/12/21 1438

## 2021-08-12 NOTE — ED Triage Notes (Signed)
Patient c/o L ear pain x5 days with drainage. Denies cough, congestion, fever.

## 2021-08-12 NOTE — Discharge Instructions (Signed)
Get help right away if: You have severe pain that is not controlled with medicine. You have swelling, redness, or pain around your ear. You have stiffness in your neck. A part of your face is not moving (paralyzed). The bone behind your ear (mastoid bone) is tender when you touch it. You develop a severe headache. 

## 2021-09-25 ENCOUNTER — Encounter: Payer: Self-pay | Admitting: Emergency Medicine

## 2021-09-25 ENCOUNTER — Ambulatory Visit
Admission: EM | Admit: 2021-09-25 | Discharge: 2021-09-25 | Disposition: A | Discharge status: Home / Self Care | Payer: 59

## 2021-09-25 DIAGNOSIS — H1031 Unspecified acute conjunctivitis, right eye: Secondary | ICD-10-CM

## 2021-09-25 MED ORDER — DOXYCYCLINE HYCLATE 100 MG PO CAPS: 100.0000 mg | ORAL_CAPSULE | Freq: Two times a day (BID) | ORAL | 0 refills | Status: DC

## 2021-09-25 MED ORDER — POLYMYXIN B-TRIMETHOPRIM 10000-0.1 UNIT/ML-% OP SOLN: 1.0000 [drp] | OPHTHALMIC | 0 refills | Status: AC

## 2021-09-25 NOTE — ED Triage Notes (Signed)
Patient states that she woke up Friday with her right eye itching, Saturday swollen, yesterday the eye was swollen shut.  There is some drainage from the eye, very itching, no visual disturbance.

## 2021-09-25 NOTE — ED Provider Notes (Signed)
[facility]    CSN: [id number] Arrival date & time: 09/25/21  1606      History   Chief Complaint Chief Complaint  Patient presents with   Eye Problem    HPI Jill [name] is a 56 y.o. female.   Patient here today for evaluation of right eye itching and swelling that started 5 days ago.  She reports that symptoms have progressively worsened since onset.  She has had some drainage from her eye.  She denies any changes in vision.  She has not had any fever.  She does not report treatment for symptoms.  The history is provided by the patient.   Past Medical History:  Diagnosis Date   Anxiety    Bipolar 1 disorder (HCC)    Hypertension    Post-traumatic stress syndrome     Patient Active Problem List   Diagnosis Date Noted   Bipolar 1 disorder, depressed, mild (HCC) 10/22/2018   Insomnia 11/09/2015   PTSD (post-traumatic stress disorder) 11/09/2015   GAD (generalized anxiety disorder) 11/09/2015    Past Surgical History:  Procedure Laterality Date   BLADDER SURGERY     CHOLECYSTECTOMY     TUBAL LIGATION      OB History   No obstetric history on file.      Home Medications    Prior to Admission medications   Medication Sig Start Date End Date Taking? Authorizing Provider  ALPRAZolam Prudy Feeler) 1 MG tablet Take 1 tablet (1 mg total) by mouth 3 (three) times daily. 07/24/21 07/24/22 Yes Plovsky, Earvin Hansen, MD  diclofenac sodium (VOLTAREN) 1 % GEL Apply 2 g topically 4 (four) times daily. 03/18/19  Yes Jeannie Fend, PA-C  doxycycline (VIBRAMYCIN) 100 MG capsule Take 1 capsule (100 mg total) by mouth 2 (two) times daily. 09/25/21  Yes Tomi Bamberger, PA-C  fenofibrate micronized (LOFIBRA) 134 MG capsule Take 134 mg by mouth daily. 09/18/21  Yes [provider]  fluticasone (FLONASE) 50 MCG/ACT nasal spray Place 1 spray into both nostrils daily. 07/11/21  Yes [provider]  hydrochlorothiazide (HYDRODIURIL) 25 MG tablet Take 25 mg by  mouth daily. Reported on 11/09/2015   Yes [provider]  metoprolol (LOPRESSOR) 50 MG tablet Take 50 mg by mouth 2 (two) times daily.   Yes [provider]  Potassium Chloride ER 20 MEQ TBCR Take 1 tablet by mouth daily. 09/18/21  Yes [provider]  QUEtiapine (SEROQUEL) 300 MG tablet Take two tablets (600 mg total) by mouth at bedtime. 07/25/21  Yes Plovsky, Earvin Hansen, MD  trimethoprim-polymyxin b (POLYTRIM) ophthalmic solution Place 1 drop into the right eye every 4 (four) hours for 7 days. 09/25/21 10/02/21 Yes Tomi Bamberger, PA-C  atorvastatin (LIPITOR) 10 MG tablet Take 1 tablet (10 mg total) by mouth daily. 09/13/20 09/13/21  Pucilowski, Roosvelt Maser, MD  buPROPion (WELLBUTRIN XL) 150 MG 24 hr tablet 1  qam  fo  1 week the 2  qam from then on 07/24/21   Archer Asa, MD  cholecalciferol (VITAMIN D) 400 UNITS TABS tablet Take 1,200 Units by mouth 2 (two) times a week. Reported on 11/09/2015    [provider]  ibuprofen (ADVIL) 600 MG tablet Take 1 tablet (600 mg total) by mouth every 6 (six) hours as needed. 09/15/19   Fayrene Helper, PA-C  metFORMIN (GLUCOPHAGE XR) 750 MG 24 hr tablet Take 1 tablet (750 mg total) by mouth daily with breakfast. 01/05/20 07/03/20  Pucilowski, Roosvelt Maser, MD  methocarbamol (ROBAXIN) 500 MG tablet Take 1 tablet (500 mg total) by mouth 2 (two) times daily. 02/02/19   Hyman Hopes, Margaux, PA-C  potassium chloride SA (KLOR-CON) 20 MEQ tablet Take 1 tablet (20 mEq total) by mouth daily. 04/21/20 07/20/20  Little Ishikawa, MD    Family History Family History  Problem Relation Age of Onset   Drug abuse Mother    Alcohol abuse Maternal Uncle    Drug abuse Maternal Uncle     Social History Social History   Tobacco Use   Smoking status: Every Day    Packs/day: 0.50    Types: Cigarettes    Last attempt to quit: 08/29/2016    Years since quitting: 5.0   Smokeless tobacco: Never  Vaping Use   Vaping Use: Never used  Substance Use  Topics   Alcohol use: No   Drug use: Not Currently    Types: Marijuana     Allergies   Aspirin   Review of Systems Review of Systems  Constitutional:  Negative for chills and fever.  HENT:  Negative for congestion.   Eyes:  Positive for discharge, redness and itching. Negative for visual disturbance.  Respiratory:  Negative for cough and shortness of breath.   Gastrointestinal:  Negative for nausea and vomiting.    Physical Exam Triage Vital Signs ED Triage Vitals  Enc Vitals Group     BP      Pulse      Resp      Temp      Temp src      SpO2      Weight      Height      Head Circumference      Peak Flow      Pain Score      Pain Loc      Pain Edu?      Excl. in GC?    No data found.  Updated Vital Signs BP (!) 144/100 (BP Location: Left Arm)    Pulse 90    Temp 98.2 F (36.8 C) (Oral)    Ht 5\' 7"  (1.702 m)    Wt 256 lb (116.1 kg)    LMP 10/12/2011    SpO2 99%    BMI 40.10 kg/m      Physical Exam Vitals and nursing note reviewed.  Constitutional:      General: She is not in acute distress.    Appearance: Normal appearance. She is not ill-appearing.  HENT:     Head: Normocephalic and atraumatic.  Eyes:     Extraocular Movements: Extraocular movements intact.     Conjunctiva/sclera:     Right eye: Right conjunctiva is injected (laterally).     Left eye: Left conjunctiva is not injected.     Pupils: Pupils are equal, round, and reactive to light.     Comments: Mild swelling noted to lateral right upper and lower eyelid with mild erythema, purulent drainage noted from lateral right eye  Cardiovascular:     Rate and Rhythm: Normal rate.  Pulmonary:     Effort: Pulmonary effort is normal.  Neurological:     Mental Status: She is alert.  Psychiatric:        Mood and Affect: Mood normal.        Behavior: Behavior normal.        Thought Content: Thought content normal.     UC Treatments / Results  Labs (all labs ordered are listed, but only abnormal  results  are displayed) Labs Reviewed - No data to display  EKG   Radiology No results found.  Procedures Procedures (including critical care time)  Medications Ordered in UC Medications - No data to display  Initial Impression / Assessment and Plan / UC Course  I have reviewed the triage vital signs and the nursing notes.  Pertinent labs & imaging results that were available during my care of the patient were reviewed by me and considered in my medical decision making (see chart for details).    Will treat to cover conjunctivitis as well as possibly developing preorbital cellulitis. Recommended further evaluation in the ED if symptoms do not improve with treatment or worsen in any way.   Final Clinical Impressions(s) / UC Diagnoses   Final diagnoses:  Acute conjunctivitis of right eye, unspecified acute conjunctivitis type   Discharge Instructions   None    ED Prescriptions     Medication Sig Dispense Auth. Provider   trimethoprim-polymyxin b (POLYTRIM) ophthalmic solution Place 1 drop into the right eye every 4 (four) hours for 7 days. 10 mL Tomi Bamberger, PA-C   doxycycline (VIBRAMYCIN) 100 MG capsule Take 1 capsule (100 mg total) by mouth 2 (two) times daily. 20 capsule Tomi Bamberger, PA-C      PDMP not reviewed this encounter.   Tomi Bamberger, PA-C 09/25/21 1814

## 2021-11-13 ENCOUNTER — Ambulatory Visit (HOSPITAL_COMMUNITY): Payer: 59 | Admitting: Psychiatry

## 2021-11-27 ENCOUNTER — Encounter (HOSPITAL_COMMUNITY): Payer: Self-pay | Admitting: Psychiatry

## 2021-11-27 ENCOUNTER — Ambulatory Visit (HOSPITAL_BASED_OUTPATIENT_CLINIC_OR_DEPARTMENT_OTHER): Payer: 59 | Admitting: Psychiatry

## 2021-11-27 VITALS — Ht 67.0 in | Wt 257.8 lb

## 2021-11-27 DIAGNOSIS — F3162 Bipolar disorder, current episode mixed, moderate: Secondary | ICD-10-CM | POA: Diagnosis not present

## 2021-11-27 MED ORDER — BUPROPION HCL ER (XL) 150 MG PO TB24: ORAL_TABLET | ORAL | 4 refills | Status: DC

## 2021-11-27 MED ORDER — QUETIAPINE FUMARATE 300 MG PO TABS: ORAL_TABLET | ORAL | 5 refills | Status: DC

## 2021-11-27 MED ORDER — ALPRAZOLAM 1 MG PO TABS: 1.0000 mg | ORAL_TABLET | Freq: Three times a day (TID) | ORAL | 4 refills | Status: DC

## 2021-11-27 NOTE — Progress Notes (Signed)
BH MD/PA/NP OP Progress Note ? ?11/14/2020 4:32 PM ?Jill [name]  ?MRN:  [id number]  ? ? ? ? ? ?Today the patient is doing well.  Work is good.  The moods to be moved her from 1 side to another.  She is now at the [employer] in downtown [place].  She likes the workers and the other people that she interacts with but she does not necessarily like the people are coming to court.  The patient is happily living with her daughter and her fianc?.  Right now they do not have any children.  The patient's mood is good.  She is happy.  She is not interested in any romance.  Her health is reasonably good.  Her weight is stable.  On her last visit we started the Wellbutrin perhaps really to try to get her to reduce her cigarette use.  She has multiple cardiovascular risk factors including that she is a 56 year old African-American woman with hypertension and hypercholesterolemia and smokes a half a pack of cigarettes.  She is in agreement that she needs to stop smoking.  I shared with her that the Wellbutrin at the higher dose of 450 mg has been shown to help people with cigarette cessation.  She is interested.  The patient is sleeping and eating well.  She does not drink any alcohol.  Her mood is good. ?  ?Visit Diagnosis:  ?No diagnosis found. ? ?Past Psychiatric History: Please see intake H&P. ? ?Past Medical History:  ?Past Medical History:  ?Diagnosis Date  ? Anxiety   ? Bipolar 1 disorder (HCC)   ? Hypertension   ? Post-traumatic stress syndrome   ?  ?Past Surgical History:  ?Procedure Laterality Date  ? BLADDER SURGERY    ? CHOLECYSTECTOMY    ? TUBAL LIGATION    ? ? ?Family Psychiatric History: Reviewed. ? ?Family History:  ?Family History  ?Problem Relation Age of Onset  ? Drug abuse Mother   ? Alcohol abuse Maternal Uncle   ? Drug abuse Maternal Uncle   ? ? ?Social History:  ?Social History  ? ?Socioeconomic History  ? Marital status: Single  ?  Spouse name: Not on file  ? Number of children: 2  ? Years of  education: 69  ? Highest education level: Not on file  ?Occupational History  ? Not on file  ?Tobacco Use  ? Smoking status: Every Day  ?  Packs/day: 0.50  ?  Types: Cigarettes  ?  Last attempt to quit: 08/29/2016  ?  Years since quitting: 5.2  ? Smokeless tobacco: Never  ?Vaping Use  ? Vaping Use: Never used  ?Substance and Sexual Activity  ? Alcohol use: No  ? Drug use: Not Currently  ?  Types: Marijuana  ? Sexual activity: Never  ?  Birth control/protection: Surgical  ?Other Topics Concern  ? Not on file  ?Social History Narrative  ? Pt lives with her son in [place]. Pt works at [employer]. Born and raised in [place] by mom until 7 then by grandparents. Pt has one older brother. 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   ? ?Social Determinants of Health  ? ?Financial Resource Strain: Not on file  ?Food Insecurity: Not on file  ?Transportation Needs: Not on file  ?Physical Activity: Not on file  ?Stress: Not on file  ?Social Connections: Not on file  ? ? ?Allergies:  ?Allergies  ?Allergen Reactions  ?  Aspirin Other (See Comments)  ?  Upset stomach  ? ? ?Metabolic Disorder Labs: ?Lab Results  ?Component Value Date  ? HGBA1C 6.7 (H) 09/12/2020  ? ?No results found for: PROLACTIN ?Lab Results  ?Component Value Date  ? CHOL 151 09/12/2020  ? TRIG 357 (H) 09/12/2020  ? HDL 43 09/12/2020  ? CHOLHDL 3.5 09/12/2020  ? LDLCALC 54 09/12/2020  ? LDLCALC 64 09/28/2019  ? ?Lab Results  ?Component Value Date  ? TSH 0.643 01/14/2020  ? ? ?Therapeutic Level Labs: ?No results found for: LITHIUM ?No results found for: VALPROATE ?No components found for:  CBMZ ? ?Current Medications: ?Current Outpatient Medications  ?Medication Sig Dispense Refill  ? ALPRAZolam (XANAX) 1 MG tablet Take 1 tablet (1 mg total) by mouth 3 (three) times daily. 90 tablet 4  ? atorvastatin (LIPITOR) 10 MG tablet Take 1 tablet (10 mg total) by mouth daily. 30 tablet 11  ? buPROPion (WELLBUTRIN XL) 150 MG 24 hr  tablet 3 qam 90 tablet 4  ? cholecalciferol (VITAMIN D) 400 UNITS TABS tablet Take 1,200 Units by mouth 2 (two) times a week. Reported on 11/09/2015    ? diclofenac sodium (VOLTAREN) 1 % GEL Apply 2 g topically 4 (four) times daily. 100 g 0  ? doxycycline (VIBRAMYCIN) 100 MG capsule Take 1 capsule (100 mg total) by mouth 2 (two) times daily. 20 capsule 0  ? fenofibrate micronized (LOFIBRA) 134 MG capsule Take 134 mg by mouth daily.    ? fluticasone (FLONASE) 50 MCG/ACT nasal spray Place 1 spray into both nostrils daily.    ? hydrochlorothiazide (HYDRODIURIL) 25 MG tablet Take 25 mg by mouth daily. Reported on 11/09/2015    ? ibuprofen (ADVIL) 600 MG tablet Take 1 tablet (600 mg total) by mouth every 6 (six) hours as needed. 30 tablet 0  ? metFORMIN (GLUCOPHAGE XR) 750 MG 24 hr tablet Take 1 tablet (750 mg total) by mouth daily with breakfast. 90 tablet 1  ? methocarbamol (ROBAXIN) 500 MG tablet Take 1 tablet (500 mg total) by mouth 2 (two) times daily. 20 tablet 0  ? metoprolol (LOPRESSOR) 50 MG tablet Take 50 mg by mouth 2 (two) times daily.    ? Potassium Chloride ER 20 MEQ TBCR Take 1 tablet by mouth daily.    ? potassium chloride SA (KLOR-CON) 20 MEQ tablet Take 1 tablet (20 mEq total) by mouth daily. 90 tablet 3  ? QUEtiapine (SEROQUEL) 300 MG tablet Take two tablets (600 mg total) by mouth at bedtime. 60 tablet 5  ? ?No current facility-administered medications for this visit.  ? ? ? ? ?Psychiatric Specialty Exam: ?Review of Systems  ?Psychiatric/Behavioral:  The patient is nervous/anxious.   ?All other systems reviewed and are negative.  ?Height 5\' 7"  (1.702 m), weight 257 lb 12.8 oz (116.9 kg), last menstrual period 10/12/2011.Body mass index is 40.38 kg/m?.  ?General Appearance: NA  ?Eye Contact:  NA  ?Speech:  Clear and Coherent and Normal Rate  ?Volume:  Normal  ?Mood:  Anxious  ?Affect:  NA  ?Thought Process:  Goal Directed and Linear  ?Orientation:  Full (Time, Place, and Person)  ?Thought Content:  Logical   ?Suicidal Thoughts:  No  ?Homicidal Thoughts:  No  ?Memory:  Immediate;   Good ?Recent;   Good ?Remote;   Good  ?Judgement:  Good  ?Insight:  Good  ?Psychomotor Activity:  NA  ?Concentration:  Concentration: Good  ?Recall:  Good  ?Fund of Knowledge: Good  ?Language: Good  ?  Akathisia:  Negative  ?Handed:  Right  ?AIMS (if indicated): not done  ?Assets:  Communication Skills ?Desire for Improvement ?Financial Resources/Insurance ?Housing ?Resilience ?Talents/Skills  ?ADL's:  Intact  ?Cognition: WNL  ?Sleep:  Good  ? ?Screenings: ?AIMS   ? ?Flowsheet Row [facility]/22/2018 [facility]  ?AIMS Total Score 0  ? ?  ? ?GAD-7   ? ?Flowsheet Row Counselor from 02/12/2021 in [facility] [place]  ?Total GAD-7 Score 17  ? ?  ? ?PHQ2-9   ? ?Flowsheet Row Counselor from 02/12/2021 in [facility] [place]  ?PHQ-2 Total Score 2  ?PHQ-9 Total Score 10  ? ?  ? ?Flowsheet Row ED from 09/25/2021 in [facility]  ED from 07/17/2021 in [facility] ED from 04/17/2021 in [facility]  ?C-SSRS RISK CATEGORY No Risk No Risk No Risk  ? ?  ? ? ? ?Assessment and Plan:   ? ? ?This patient is diagnosed with bipolar disorder type I.  She shows no signs of manic symptoms or of depression symptoms at this time.  She does well taking Seroquel 300 mg 2 at night.  She sleeps well.  She also has generalized anxiety disorder and takes Xanax 1 mg 3 times daily.  She has been taking it for over a decade.  We started her on Wellbutrin which really has not affected her mood in any good way or that way.  She believes it might be affecting how much cigarettes she is smoking.  She is down to half a pack and she hopes to reduce it further.  She will continue taking her Xanax, Seroquel and her Wellbutrin.  The patient will return to see me in 4  months.. ? ? ?Gypsy Balsam, MD ?11/14/2020, 4:32 PM ?

## 2022-04-10 ENCOUNTER — Ambulatory Visit (HOSPITAL_BASED_OUTPATIENT_CLINIC_OR_DEPARTMENT_OTHER): Payer: 59 | Admitting: Psychiatry

## 2022-04-10 DIAGNOSIS — F3130 Bipolar disorder, current episode depressed, mild or moderate severity, unspecified: Secondary | ICD-10-CM | POA: Diagnosis not present

## 2022-04-10 MED ORDER — BUPROPION HCL ER (XL) 150 MG PO TB24: ORAL_TABLET | ORAL | 4 refills | Status: DC

## 2022-04-10 MED ORDER — ALPRAZOLAM 1 MG PO TABS: 1.0000 mg | ORAL_TABLET | Freq: Three times a day (TID) | ORAL | 4 refills | Status: DC

## 2022-04-10 MED ORDER — QUETIAPINE FUMARATE 300 MG PO TABS: ORAL_TABLET | ORAL | 5 refills | Status: DC

## 2022-04-10 NOTE — Progress Notes (Signed)
BH MD/PA/NP OP Progress Note  11/14/2020 4:46 PM Jill [name]  MRN:  [id number]      Today the patient is not doing that well.  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  Further the people above her and told all the people who are working in security they cannot look at their cell phones while at work and made a lot of restrictions.  The patient feels like she is being watched closely.  She feels tense at work.  The clientele are difficult to work with and now the administration is putting pressure on everyone.  She is interested in looking for another job.  Dad is the fact that her daughter and her fianc together the patient had moved to a new place.  The patient is not getting along so well with her daughter anymore.  Further the patient has a son who is chronically mentally ill who is living in a motel because the patient is financially supporting him.  The patient says she is running out of money.  She can no longer support him.  She is given him a deadline find a new place to stay.  The patient feels very stressed.  Ironically that she has not increased her use of cigarettes.  She is down to a little less than a half a pack which is good.  Further she is also lost about 10 pounds which she was surprised about.  The patient sleep is a bit fragmented.  At this time in my opinion she is at the maximum dose of Xanax 1 mg 3 times daily that she been on for over a decade.  She still takes Seroquel 300 mg 2 at night.  It helps her sleep.  It together with the Wellbutrin will be thought of his being used as adjunct treatment for clinical depression.  It is my wish actually for her to be on less medicines.  The patient did come to our therapist for 3 or 4 sessions but now it simply unrealistic in terms of her work schedule.   The patient's next move will be to find a new job.  Unfortunately today the patient was 20 minutes late for a 30-minute visit.  I will therefore ask her to return to see me in 2 months.   Visit Diagnosis:  No diagnosis found.  Past Psychiatric History: Please see intake H&P.  Past Medical History:  Past Medical History:  Diagnosis Date   Anxiety    Bipolar 1 disorder (HCC)    Hypertension    Post-traumatic stress syndrome     Past Surgical History:  Procedure Laterality Date   BLADDER SURGERY     CHOLECYSTECTOMY     TUBAL LIGATION      Family Psychiatric History: Reviewed.  Family History:  Family History  Problem Relation Age of Onset   Drug abuse Mother    Alcohol abuse Maternal Uncle    Drug abuse Maternal Uncle     Social History:  Social History   Socioeconomic History   Marital status: Single    Spouse name: Not on file   Number of children: 2   Years of education: 14   Highest education level: Not on file  Occupational History   Not on file  Tobacco Use   Smoking status: Every Day  Packs/day: 0.50    Types: Cigarettes    Last attempt to quit: 08/29/2016    Years since quitting: 5.6   Smokeless tobacco: Never  Vaping Use   Vaping Use: Never used  Substance and Sexual Activity   Alcohol use: No   Drug use: Not Currently    Types: Marijuana   Sexual activity: Never    Birth control/protection: Surgical  Other Topics Concern   Not on file  Social History Narrative   Pt lives with her son in [place]. Pt works at [employer]. Born and raised in [place] by mom until 7 then by grandparents. Pt has one older brother. 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    Social Determinants of Health   Financial Resource Strain: Not on file  Food Insecurity: Not on file  Transportation Needs: Not on file  Physical Activity: Not on file  Stress: Not on file  Social Connections: Not on file    Allergies:   Allergies  Allergen Reactions   Aspirin Other (See Comments)    Upset stomach    Metabolic Disorder Labs: Lab Results  Component Value Date   HGBA1C 6.7 (H) 09/12/2020   No results found for: "PROLACTIN" Lab Results  Component Value Date   CHOL 151 09/12/2020   TRIG 357 (H) 09/12/2020   HDL 43 09/12/2020   CHOLHDL 3.5 09/12/2020   LDLCALC 54 09/12/2020   LDLCALC 64 09/28/2019   Lab Results  Component Value Date   TSH 0.643 01/14/2020    Therapeutic Level Labs: No results found for: "LITHIUM" No results found for: "VALPROATE" No results found for: "CBMZ"  Current Medications: Current Outpatient Medications  Medication Sig Dispense Refill   ALPRAZolam (XANAX) 1 MG tablet Take 1 tablet (1 mg total) by mouth 3 (three) times daily. 90 tablet 4   atorvastatin (LIPITOR) 10 MG tablet Take 1 tablet (10 mg total) by mouth daily. 30 tablet 11   buPROPion (WELLBUTRIN XL) 150 MG 24 hr tablet 3 qam 90 tablet 4   cholecalciferol (VITAMIN D) 400 UNITS TABS tablet Take 1,200 Units by mouth 2 (two) times a week. Reported on 11/09/2015     diclofenac sodium (VOLTAREN) 1 % GEL Apply 2 g topically 4 (four) times daily. 100 g 0   doxycycline (VIBRAMYCIN) 100 MG capsule Take 1 capsule (100 mg total) by mouth 2 (two) times daily. 20 capsule 0   fenofibrate micronized (LOFIBRA) 134 MG capsule Take 134 mg by mouth daily.     fluticasone (FLONASE) 50 MCG/ACT nasal spray Place 1 spray into both nostrils daily.     hydrochlorothiazide (HYDRODIURIL) 25 MG tablet Take 25 mg by mouth daily. Reported on 11/09/2015     ibuprofen (ADVIL) 600 MG tablet Take 1 tablet (600 mg total) by mouth every 6 (six) hours as needed. 30 tablet 0   metFORMIN (GLUCOPHAGE XR) 750 MG 24 hr tablet Take 1 tablet (750 mg total) by mouth daily with breakfast. 90 tablet 1   methocarbamol (ROBAXIN) 500 MG tablet Take 1 tablet (500 mg total) by mouth 2 (two) times daily. 20 tablet 0   metoprolol (LOPRESSOR) 50 MG tablet Take 50 mg  by mouth 2 (two) times daily.     Potassium Chloride ER 20 MEQ TBCR Take 1 tablet by mouth daily.     potassium chloride SA (KLOR-CON) 20 MEQ tablet Take 1 tablet (20 mEq total) by mouth daily. 90 tablet 3  QUEtiapine (SEROQUEL) 300 MG tablet Take two tablets (600 mg total) by mouth at bedtime. 60 tablet 5   No current facility-administered medications for this visit.      Psychiatric Specialty Exam: Review of Systems  Psychiatric/Behavioral:  The patient is nervous/anxious.   All other systems reviewed and are negative.   Last menstrual period 10/12/2011.There is no height or weight on file to calculate BMI.  General Appearance: NA  Eye Contact:  NA  Speech:  Clear and Coherent and Normal Rate  Volume:  Normal  Mood:  Anxious  Affect:  NA  Thought Process:  Goal Directed and Linear  Orientation:  Full (Time, Place, and Person)  Thought Content: Logical   Suicidal Thoughts:  No  Homicidal Thoughts:  No  Memory:  Immediate;   Good Recent;   Good Remote;   Good  Judgement:  Good  Insight:  Good  Psychomotor Activity:  NA  Concentration:  Concentration: Good  Recall:  Good  Fund of Knowledge: Good  Language: Good  Akathisia:  Negative  Handed:  Right  AIMS (if indicated): not done  Assets:  Communication Skills Desire for Improvement Financial Resources/Insurance Housing Resilience Talents/Skills  ADL's:  Intact  Cognition: WNL  Sleep:  Good   Screenings: AIMS    Flowsheet Row [facility]  AIMS Total Score 0      GAD-7    Flowsheet Row Counselor from 02/12/2021 in [facility] [place]  Total GAD-7 Score 17      PHQ2-9    Flowsheet Row Counselor from 02/12/2021 in [facility] [place]  PHQ-2 Total Score 2  PHQ-9 Total Score 10      Flowsheet Row ED from 09/25/2021 in [facility]  ED from 07/17/2021  in [facility] ED from 04/17/2021 in [facility]  C-SSRS RISK CATEGORY No Risk No Risk No Risk        Assessment and Plan:      This patient has a diagnosis of bipolar disorder.  This will be reevaluated.  Right now she has an adjustment disorder with an anxious mood state related to her work stresses and her family stresses.  Ideally the patient will be in therapy but that does not seem to be realistic.  For now she will continue Xanax 1 mg 3 times daily which I suspect she will stay on probably indefinitely.  On the other hand she takes Seroquel 600 mg and my hope is probably to change that in the next year.  She will continue taking Wellbutrin which she says is helping her cigarette issues and actually maintaining a lower weight.  The patient is not suicidal.  She is functioning reasonably well. Gypsy Balsam, MD 11/14/2020, 4:46 PM

## 2022-06-07 ENCOUNTER — Emergency Department (HOSPITAL_COMMUNITY)
Admission: EM | Admit: 2022-06-07 | Discharge: 2022-06-07 | Disposition: A | Discharge status: Home / Self Care | Payer: Commercial Managed Care - HMO | Attending: Emergency Medicine | Admitting: Emergency Medicine

## 2022-06-07 ENCOUNTER — Encounter (HOSPITAL_COMMUNITY): Payer: Self-pay

## 2022-06-07 DIAGNOSIS — L02411 Cutaneous abscess of right axilla: Secondary | ICD-10-CM | POA: Diagnosis present

## 2022-06-07 DIAGNOSIS — I10 Essential (primary) hypertension: Secondary | ICD-10-CM | POA: Insufficient documentation

## 2022-06-07 DIAGNOSIS — Z79899 Other long term (current) drug therapy: Secondary | ICD-10-CM | POA: Diagnosis not present

## 2022-06-07 MED ORDER — DOXYCYCLINE HYCLATE 100 MG PO CAPS: 100.0000 mg | ORAL_CAPSULE | Freq: Two times a day (BID) | ORAL | 0 refills | Status: DC

## 2022-06-07 MED ORDER — LIDOCAINE-EPINEPHRINE (PF) 2 %-1:200000 IJ SOLN
10.0000 mL | Freq: Once | INTRAMUSCULAR | Status: DC
  Filled 2022-06-07: qty 20

## 2022-06-07 NOTE — ED Triage Notes (Signed)
"  Lumps on right armpit" Pt denies drainage states recently switched deodorants.

## 2022-06-07 NOTE — ED Provider Notes (Signed)
[facility]PT Provider Note   CSN: [id number] Arrival date & time: 06/07/22  0950     History  Chief Complaint  Patient presents with   Abscess    Jill [name] is a 56 y.o. female.   Abscess Patient presents with pain and swelling in right axilla.  Has had for the last few days.  Has had previous episodes in the past that have resolved on their own.  Has had drainage before.  No fevers.  Recently changed deodorant.  States she has 2 swellings one smaller and one larger 1.    Past Medical History:  Diagnosis Date   Anxiety    Bipolar 1 disorder (HCC)    Hypertension    Post-traumatic stress syndrome     Home Medications Prior to Admission medications   Medication Sig Start Date End Date Taking? Authorizing Provider  doxycycline (VIBRAMYCIN) 100 MG capsule Take 1 capsule (100 mg total) by mouth 2 (two) times daily. 06/07/22  Yes Benjiman Core, MD  ALPRAZolam Prudy Feeler) 1 MG tablet Take 1 tablet (1 mg total) by mouth 3 (three) times daily. 04/10/22 04/10/23  Plovsky, Earvin Hansen, MD  atorvastatin (LIPITOR) 10 MG tablet Take 1 tablet (10 mg total) by mouth daily. 09/13/20 09/13/21  Pucilowski, Roosvelt Maser, MD  buPROPion (WELLBUTRIN XL) 150 MG 24 hr tablet 3 qam 04/10/22   Plovsky, Earvin Hansen, MD  cholecalciferol (VITAMIN D) 400 UNITS TABS tablet Take 1,200 Units by mouth 2 (two) times a week. Reported on 11/09/2015    [provider]  diclofenac sodium (VOLTAREN) 1 % GEL Apply 2 g topically 4 (four) times daily. 03/18/19   Jeannie Fend, PA-C  fenofibrate micronized (LOFIBRA) 134 MG capsule Take 134 mg by mouth daily. 09/18/21   [provider]  fluticasone (FLONASE) 50 MCG/ACT nasal spray Place 1 spray into both nostrils daily. 07/11/21   [provider]  hydrochlorothiazide (HYDRODIURIL) 25 MG tablet Take 25 mg by mouth daily. Reported on 11/09/2015    [provider]  ibuprofen (ADVIL) 600 MG tablet Take 1 tablet (600 mg  total) by mouth every 6 (six) hours as needed. 09/15/19   Fayrene Helper, PA-C  metFORMIN (GLUCOPHAGE XR) 750 MG 24 hr tablet Take 1 tablet (750 mg total) by mouth daily with breakfast. 01/05/20 07/03/20  Pucilowski, Roosvelt Maser, MD  methocarbamol (ROBAXIN) 500 MG tablet Take 1 tablet (500 mg total) by mouth 2 (two) times daily. 02/02/19   Hyman Hopes, Margaux, PA-C  metoprolol (LOPRESSOR) 50 MG tablet Take 50 mg by mouth 2 (two) times daily.    [provider]  Potassium Chloride ER 20 MEQ TBCR Take 1 tablet by mouth daily. 09/18/21   [provider]  potassium chloride SA (KLOR-CON) 20 MEQ tablet Take 1 tablet (20 mEq total) by mouth daily. 04/21/20 07/20/20  Little Ishikawa, MD  QUEtiapine (SEROQUEL) 300 MG tablet Take two tablets (600 mg total) by mouth at bedtime. 04/10/22   Plovsky, Earvin Hansen, MD      Allergies    Aspirin    Review of Systems   Review of Systems  Physical Exam Updated Vital Signs BP 125/84 (BP Location: Left Arm)   Pulse 79   Temp 98.1 F (36.7 C) (Oral)   Resp 18   Ht 5\' 7"  (1.702 m)   Wt 97 kg   LMP 10/12/2011   SpO2 95%   BMI 33.49 kg/m  Physical Exam Vitals and nursing note reviewed.  Constitutional:  Appearance: Normal appearance.  Skin:    Capillary Refill: Capillary refill takes less than 2 seconds.     Comments: Right axilla has a more superior area approximately 1 cm that is firm.  Inferior to this there is approximately a 3 cm fluctuant area.  Some tenderness but no real erythema.  Neurological:     Mental Status: She is alert and oriented to person, place, and time.     ED Results / Procedures / Treatments   Labs (all labs ordered are listed, but only abnormal results are displayed) Labs Reviewed - No data to display  EKG None  Radiology No results found.  Procedures .Marland KitchenIncision and Drainage  Date/Time: 06/07/2022 10:10 AM  Performed by: Benjiman Core, MD Authorized by: Benjiman Core, MD   Consent:    Consent  obtained:  Verbal   Consent given by:  Patient   Risks discussed:  Bleeding, incomplete drainage, pain and damage to other organs   Alternatives discussed:  No treatment and delayed treatment Location:    Type:  Abscess   Size:  3   Location:  Upper extremity   Upper extremity location: R axilla. Pre-procedure details:    Skin preparation:  Chlorhexidine Sedation:    Sedation type:  None Anesthesia:    Anesthesia method:  Local infiltration   Local anesthetic:  Lidocaine 2% WITH epi Procedure type:    Complexity:  Simple Procedure details:    Ultrasound guidance: no     Needle aspiration: no     Incision types:  Single straight   Incision depth:  Subcutaneous   Wound management:  Probed and deloculated   Drainage:  Purulent   Drainage amount:  Moderate   Wound treatment:  Drain placed   Packing materials:  1/4 in iodoform gauze Post-procedure details:    Procedure completion:  Tolerated well, no immediate complications     Medications Ordered in ED Medications  lidocaine-EPINEPHrine (XYLOCAINE W/EPI) 2 %-1:200000 (PF) injection 10 mL (has no administration in time range)    ED Course/ Medical Decision Making/ A&P                           Medical Decision Making Risk Prescription drug management.   Patient presents with swelling and pain in her right axilla.  Mass and abscess on the differential diagnosis.  I think is most likely an abscess.  Will incise and drain the larger of the 2.  We will give antibiotics for the other 1.  Successful drainage of abscess.  Still has some surrounding induration.  Doxycycline for home.  Small amount of packing placed.        Final Clinical Impression(s) / ED Diagnoses Final diagnoses:  Abscess of axilla, right    Rx / DC Orders ED Discharge Orders          Ordered    doxycycline (VIBRAMYCIN) 100 MG capsule  2 times daily        06/07/22 1153              Benjiman Core, MD 06/07/22 1547

## 2022-06-17 ENCOUNTER — Telehealth (HOSPITAL_COMMUNITY): Payer: Self-pay | Admitting: Licensed Clinical Social Worker

## 2022-06-17 NOTE — Telephone Encounter (Signed)
Maeve has not been engaged in therapy since 04/09/21.  Clinician Theron Arista by phone today to inquire about whether she intends to make a followup appointment soon, and offer assistance with scheduling and referral resources if needed.  Deasha did answer this phone call, and reported that she would be interested in restarting therapy soon, although she will need to check her schedule first.  Ladina reported that she does have our office number, and would outreach them soon for an appointment.    Noralee Stain, [place], LCAS 06/17/22

## 2022-06-18 ENCOUNTER — Ambulatory Visit (HOSPITAL_COMMUNITY): Payer: 59 | Admitting: Psychiatry

## 2022-07-03 ENCOUNTER — Ambulatory Visit (HOSPITAL_BASED_OUTPATIENT_CLINIC_OR_DEPARTMENT_OTHER): Payer: Commercial Managed Care - HMO | Admitting: Psychiatry

## 2022-07-03 DIAGNOSIS — F325 Major depressive disorder, single episode, in full remission: Secondary | ICD-10-CM | POA: Diagnosis not present

## 2022-07-03 MED ORDER — QUETIAPINE FUMARATE 400 MG PO TABS: ORAL_TABLET | ORAL | 4 refills | Status: DC

## 2022-07-03 MED ORDER — BUPROPION HCL ER (XL) 150 MG PO TB24: ORAL_TABLET | ORAL | 4 refills | Status: DC

## 2022-07-03 MED ORDER — ALPRAZOLAM 1 MG PO TABS: 1.0000 mg | ORAL_TABLET | Freq: Three times a day (TID) | ORAL | 4 refills | Status: DC

## 2022-07-03 NOTE — Progress Notes (Signed)
Jill [name]/PA/NP OP Progress Note  11/14/2020 4:26 PM Jill [name]  MRN:  [id number]    Today the patient is doing really well.  Things have changed.  A lot of circumstances have changed in her favor.  She took a clear stand with her mentally ill son that she has not been financially supportive as does her deadline.  In fact stuff together new place to live that he is renting.  He is financially independent.  The patient feels that her weight is off of her.  Further the patient's daughter and her fianc broke up because he was not being faithful to her.  As a result the patient and her daughter moved in together and found a really good place to live.  The patient is doing better at work.  She seems to be more resilient.  While he limited her ability to use his cell phone she instead is using an [employer] watch.  She says she feels better at work.  She is going out to tolerate things better.  Further she continues to slowly lose weight.  Her cigarette use is down to a 30 pack a day.  Overall physically and emotionally she is much better.  Things worked out well for her.  She is much happier.  Today she agreed to reduce her Seroquel from 600 mg down to 400 mg and we will continue her Wellbutrin and her Xanax as prescribed. Visit Diagnosis:  No diagnosis found.  Past Psychiatric History: Please see intake H&P.  Past Medical History:  Past Medical History:  Diagnosis Date   Anxiety    Bipolar 1 disorder ([place])    Hypertension    Post-traumatic stress syndrome     Past Surgical History:  Procedure Laterality Date   BLADDER SURGERY     CHOLECYSTECTOMY     TUBAL LIGATION      Family Psychiatric History: Reviewed.  Family History:  Family History  Problem Relation Age of Onset   Drug abuse Mother    Alcohol abuse Maternal Uncle    Drug abuse Maternal Uncle     Social History:  Social History   Socioeconomic History   Marital status: Single    Spouse name: Not on file   Number of children:  2   Years of education: 14   Highest education level: Not on file  Occupational History   Not on file  Tobacco Use   Smoking status: Every Day    Packs/day: 0.50    Types: Cigarettes    Last attempt to quit: 08/29/2016    Years since quitting: 5.8   Smokeless tobacco: Never  Vaping Use   Vaping Use: Never used  Substance and Sexual Activity   Alcohol use: No   Drug use: Not Currently    Types: Marijuana   Sexual activity: Never    Birth control/protection: Surgical  Other Topics Concern   Not on file  Social History Narrative   Pt lives with her son in [place]. Pt works at [employer]. Born and raised in [place] by mom until 7 then by grandparents. Pt has one older brother. 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    Social Determinants of Health   Financial Resource Strain: Not on file  Food Insecurity: Not on file  Transportation Needs: Not on file  Physical Activity: Not on file  Stress: Not on file  Social Connections: Not on file  Allergies:  Allergies  Allergen Reactions   Aspirin Other (See Comments)    Upset stomach    Metabolic Disorder Labs: Lab Results  Component Value Date   HGBA1C 6.7 (H) 09/12/2020   No results found for: "PROLACTIN" Lab Results  Component Value Date   CHOL 151 09/12/2020   TRIG 357 (H) 09/12/2020   HDL 43 09/12/2020   CHOLHDL 3.5 09/12/2020   LDLCALC 54 09/12/2020   LDLCALC 64 09/28/2019   Lab Results  Component Value Date   TSH 0.643 01/14/2020    Therapeutic Level Labs: No results found for: "LITHIUM" No results found for: "VALPROATE" No results found for: "CBMZ"  Current Medications: Current Outpatient Medications  Medication Sig Dispense Refill   ALPRAZolam (XANAX) 1 MG tablet Take 1 tablet (1 mg total) by mouth 3 (three) times daily. 90 tablet 4   atorvastatin (LIPITOR) 10 MG tablet Take 1 tablet (10 mg total) by mouth daily. 30 tablet 11   buPROPion (WELLBUTRIN  XL) 150 MG 24 hr tablet 3 qam 90 tablet 4   cholecalciferol (VITAMIN D) 400 UNITS TABS tablet Take 1,200 Units by mouth 2 (two) times a week. Reported on 11/09/2015     diclofenac sodium (VOLTAREN) 1 % GEL Apply 2 g topically 4 (four) times daily. 100 g 0   doxycycline (VIBRAMYCIN) 100 MG capsule Take 1 capsule (100 mg total) by mouth 2 (two) times daily. 14 capsule 0   fenofibrate micronized (LOFIBRA) 134 MG capsule Take 134 mg by mouth daily.     fluticasone (FLONASE) 50 MCG/ACT nasal spray Place 1 spray into both nostrils daily.     hydrochlorothiazide (HYDRODIURIL) 25 MG tablet Take 25 mg by mouth daily. Reported on 11/09/2015     ibuprofen (ADVIL) 600 MG tablet Take 1 tablet (600 mg total) by mouth every 6 (six) hours as needed. 30 tablet 0   metFORMIN (GLUCOPHAGE XR) 750 MG 24 hr tablet Take 1 tablet (750 mg total) by mouth daily with breakfast. 90 tablet 1   methocarbamol (ROBAXIN) 500 MG tablet Take 1 tablet (500 mg total) by mouth 2 (two) times daily. 20 tablet 0   metoprolol (LOPRESSOR) 50 MG tablet Take 50 mg by mouth 2 (two) times daily.     Potassium Chloride ER 20 MEQ TBCR Take 1 tablet by mouth daily.     potassium chloride SA (KLOR-CON) 20 MEQ tablet Take 1 tablet (20 mEq total) by mouth daily. 90 tablet 3   QUEtiapine (SEROQUEL) 400 MG tablet Take two tablets (600 mg total) by mouth at bedtime. 30 tablet 4   No current facility-administered medications for this visit.      Psychiatric Specialty Exam: Review of Systems  Psychiatric/Behavioral:  The patient is nervous/anxious.   All other systems reviewed and are negative.   Last menstrual period 10/12/2011.There is no height or weight on file to calculate BMI.  General Appearance: NA  Eye Contact:  NA  Speech:  Clear and Coherent and Normal Rate  Volume:  Normal  Mood:  Anxious  Affect:  NA  Thought Process:  Goal Directed and Linear  Orientation:  Full (Time, Place, and Person)  Thought Content: Logical   Suicidal  Thoughts:  No  Homicidal Thoughts:  No  Memory:  Immediate;   Good Recent;   Good Remote;   Good  Judgement:  Good  Insight:  Good  Psychomotor Activity:  NA  Concentration:  Concentration: Good  Recall:  Good  Fund of Knowledge: Good  Language:  Good  Akathisia:  Negative  Handed:  Right  AIMS (if indicated): not done  Assets:  Communication Skills Desire for Improvement Financial Resources/Insurance Housing Resilience Talents/Skills  ADL's:  Intact  Cognition: WNL  Sleep:  Good   Screenings: AIMS    Flowsheet Row [facility]  AIMS Total Score 0      GAD-7    Flowsheet Row Counselor from 02/12/2021 in [facility] [place]  Total GAD-7 Score 17      PHQ2-9    Flowsheet Row Counselor from 02/12/2021 in [facility] [place]  PHQ-2 Total Score 2  PHQ-9 Total Score 10      Flowsheet Row ED from 06/07/2022 in [facility] from 09[facility]25/2021 in [facility]  ED from 07/17/2021 in [facility]  C-SSRS RISK CATEGORY No Risk No Risk No Risk        Assessment and Plan:     This patient carries a tentative diagnosis of bipolar disorder.  She has been on Xanax 1 mg 3 a day for well over a decade at this point I do not wish to change it.  We did increase her Seroquel from 300 to 600 mg but now regarding reduce it back down to 400 mg.  On her next visit we will make a thorough effort to reevaluate her diagnosis.  She will continue taking Wellbutrin as prescribed.  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  She will return to see me in 3 months. Gypsy Balsam, [name] 11/14/2020, 4:26 PM

## 2022-10-02 ENCOUNTER — Ambulatory Visit (HOSPITAL_COMMUNITY): Payer: Commercial Managed Care - HMO | Admitting: Psychiatry

## 2022-11-06 ENCOUNTER — Encounter (HOSPITAL_COMMUNITY): Payer: Self-pay | Admitting: Psychiatry

## 2022-11-06 ENCOUNTER — Ambulatory Visit (HOSPITAL_BASED_OUTPATIENT_CLINIC_OR_DEPARTMENT_OTHER): Payer: 59 | Admitting: Psychiatry

## 2022-11-06 VITALS — BP 131/85 | HR 80 | Ht 67.0 in | Wt 228.0 lb

## 2022-11-06 DIAGNOSIS — F325 Major depressive disorder, single episode, in full remission: Secondary | ICD-10-CM

## 2022-11-06 MED ORDER — BUPROPION HCL ER (XL) 150 MG PO TB24: ORAL_TABLET | ORAL | 4 refills | Status: DC

## 2022-11-06 MED ORDER — QUETIAPINE FUMARATE 400 MG PO TABS: ORAL_TABLET | ORAL | 4 refills | Status: DC

## 2022-11-06 MED ORDER — ALPRAZOLAM 1 MG PO TABS: 1.0000 mg | ORAL_TABLET | Freq: Three times a day (TID) | ORAL | 4 refills | Status: DC

## 2022-11-06 NOTE — Progress Notes (Signed)
BH MD/PA/NP OP Progress Note  11/14/2020 3:34 PM Jill CurtRhonda C [name]  MRN:  [id number]     Today the patient is doing very well.  She lives with her daughter and is working out very well.  Her work is better.  She is more adjusted.  She is on her cigarettes probably less than a half a pack.  Today once again we had a discussion of Xanax and Seroquel.  Xanax she has been taking for years and I have no interest in changing it.  She drinks no alcohol and no drugs.  Her Seroquel has been reduced down to 400 mg and today I recommended that we start reducing it further.  I shared with her that being an African-American man who smokes that she is at risk for heart disease.  On the other hand she does have hypercholesterolemia which is well controlled and in fact she has lost weight.  She changed her diet which is great.  Physically she is actually quite well.  Her energy level is good.  She likes what she does.  Life is good as far as she is concerned. Visit Diagnosis:  No diagnosis found.  Past Psychiatric History: Please see intake H&P.  Past Medical History:  Past Medical History:  Diagnosis Date   Anxiety    Bipolar 1 disorder    Hypertension    Post-traumatic stress syndrome     Past Surgical History:  Procedure Laterality Date   BLADDER SURGERY     CHOLECYSTECTOMY     TUBAL LIGATION      Family Psychiatric History: Reviewed.  Family History:  Family History  Problem Relation Age of Onset   Drug abuse Mother    Alcohol abuse Maternal Uncle    Drug abuse Maternal Uncle     Social History:  Social History   Socioeconomic History   Marital status: Single    Spouse name: Not on file   Number of children: 2   Years of education: 14   Highest education level: Not on file  Occupational History   Not on file  Tobacco Use   Smoking status: Every Day    Packs/day: .5    Types: Cigarettes    Last attempt to quit: 08/29/2016    Years since quitting: 6.1   Smokeless tobacco: Never   Vaping Use   Vaping Use: Never used  Substance and Sexual Activity   Alcohol use: No   Drug use: Not Currently    Types: Marijuana   Sexual activity: Never    Birth control/protection: Surgical  Other Topics Concern   Not on file  Social History Narrative   [place]. Pt works at [employer]. Born and raised in [place] by mom until 7 then by grandparents. Pt has one older brother. 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 Never married and has 2 kids.    Social Determinants of Health   Financial Resource Strain: Not on file  Food Insecurity: Not on file  Transportation Needs: Not on file  Physical Activity: Not on file  Stress: Not on file  Social Connections: Not on file    Allergies:  Allergies  Allergen Reactions   Aspirin Other (See Comments)    Upset stomach    Metabolic Disorder Labs: Lab Results  Component Value Date   HGBA1C 6.7 (H) 09/12/2020   No results found for: "PROLACTIN" Lab Results  Component Value Date   CHOL 151  09/12/2020   TRIG 357 (H) 09/12/2020   HDL 43 09/12/2020   CHOLHDL 3.5 09/12/2020   LDLCALC 54 09/12/2020   LDLCALC 64 09/28/2019   Lab Results  Component Value Date   TSH 0.643 01/14/2020    Therapeutic Level Labs: No results found for: "LITHIUM" No results found for: "VALPROATE" No results found for: "CBMZ"  Current Medications: Current Outpatient Medications  Medication Sig Dispense Refill   cholecalciferol (VITAMIN D) 400 UNITS TABS tablet Take 1,200 Units by mouth 2 (two) times a week. Reported on 11/09/2015     diclofenac sodium (VOLTAREN) 1 % GEL Apply 2 g topically 4 (four) times daily. 100 g 0   doxycycline (VIBRAMYCIN) 100 MG capsule Take 1 capsule (100 mg total) by mouth 2 (two) times daily. 14 capsule 0   fenofibrate micronized (LOFIBRA) 134 MG capsule Take 134 mg by mouth daily.     fluticasone (FLONASE) 50 MCG/ACT nasal spray Place 1 spray into both nostrils daily.      hydrochlorothiazide (HYDRODIURIL) 25 MG tablet Take 25 mg by mouth daily. Reported on 11/09/2015     ibuprofen (ADVIL) 600 MG tablet Take 1 tablet (600 mg total) by mouth every 6 (six) hours as needed. 30 tablet 0   methocarbamol (ROBAXIN) 500 MG tablet Take 1 tablet (500 mg total) by mouth 2 (two) times daily. 20 tablet 0   metoprolol (LOPRESSOR) 50 MG tablet Take 50 mg by mouth 2 (two) times daily.     Potassium Chloride ER 20 MEQ TBCR Take 1 tablet by mouth daily.     ALPRAZolam (XANAX) 1 MG tablet Take 1 tablet (1 mg total) by mouth 3 (three) times daily. 90 tablet 4   atorvastatin (LIPITOR) 10 MG tablet Take 1 tablet (10 mg total) by mouth daily. 30 tablet 11   buPROPion (WELLBUTRIN XL) 150 MG 24 hr tablet 3 qam 90 tablet 4   metFORMIN (GLUCOPHAGE XR) 750 MG 24 hr tablet Take 1 tablet (750 mg total) by mouth daily with breakfast. 90 tablet 1   potassium chloride SA (KLOR-CON) 20 MEQ tablet Take 1 tablet (20 mEq total) by mouth daily. 90 tablet 3   QUEtiapine (SEROQUEL) 400 MG tablet Take two tablets (600 mg total) by mouth at bedtime. 30 tablet 4   No current facility-administered medications for this visit.      Psychiatric Specialty Exam: Review of Systems  Psychiatric/Behavioral:  The patient is nervous/anxious.   All other systems reviewed and are negative.   Blood pressure 131/85, pulse 80, height 5\' 7"  (1.702 m), weight 228 lb (103.4 kg), last menstrual period 10/12/2011.Body mass index is 35.71 kg/m.  General Appearance: NA  Eye Contact:  NA  Speech:  Clear and Coherent and Normal Rate  Volume:  Normal  Mood:  Anxious  Affect:  NA  Thought Process:  Goal Directed and Linear  Orientation:  Full (Time, Place, and Person)  Thought Content: Logical   Suicidal Thoughts:  No  Homicidal Thoughts:  No  Memory:  Immediate;   Good Recent;   Good Remote;   Good  Judgement:  Good  Insight:  Good  Psychomotor Activity:  NA  Concentration:  Concentration: Good  Recall:  Good   Fund of Knowledge: Good  Language: Good  Akathisia:  Negative  Handed:  Right  AIMS (if indicated): not done  Assets:  Communication Skills Desire for Improvement Financial Resources/Insurance Housing Resilience Talents/Skills  ADL's:  Intact  Cognition: WNL  Sleep:  Good   Screenings:  AIMS    Flowsheet Row [facility]  AIMS Total Score 0      GAD-7    Flowsheet Row Counselor from 02/12/2021 in [facility] at [facility]  Total GAD-7 Score 17      PHQ2-9    Flowsheet Row Counselor from 02/12/2021 in [facility] at [facility] Total Score 2  PHQ-9 Total Score 10      Flowsheet Row ED from 06/07/2022 in [facility] Department at [facility] ED from 09/25/2021 in [facility] at [facility] ED from 07/17/2021 in [facility] Department at [facility]  C-SSRS RISK CATEGORY No Risk No Risk No Risk        Assessment and Plan:      Today the patient had an aims scale.  He demonstrated no evidence of tardive dyskinesia.  For now she will continue taking Seroquel 400 mg and her next visit in 4 months we will again discuss possibly reducing it down to 200 mg.  She will continue taking Xanax 1 mg 3 times daily.  She drinks no alcohol and uses no drugs and is very reliable.  She continues taking Wellbutrin as prescribed.  Her mood is good.  She is functioning extremely well. 11/14/2020, 3:34 PM

## 2022-12-31 DIAGNOSIS — E538 Deficiency of other specified B group vitamins: Secondary | ICD-10-CM | POA: Diagnosis not present

## 2022-12-31 DIAGNOSIS — I10 Essential (primary) hypertension: Secondary | ICD-10-CM | POA: Diagnosis not present

## 2022-12-31 DIAGNOSIS — E781 Pure hyperglyceridemia: Secondary | ICD-10-CM | POA: Diagnosis not present

## 2022-12-31 DIAGNOSIS — R7309 Other abnormal glucose: Secondary | ICD-10-CM | POA: Diagnosis not present

## 2022-12-31 DIAGNOSIS — E559 Vitamin D deficiency, unspecified: Secondary | ICD-10-CM | POA: Diagnosis not present

## 2022-12-31 DIAGNOSIS — J309 Allergic rhinitis, unspecified: Secondary | ICD-10-CM | POA: Diagnosis not present

## 2022-12-31 DIAGNOSIS — M25511 Pain in right shoulder: Secondary | ICD-10-CM | POA: Diagnosis not present

## 2022-12-31 DIAGNOSIS — Z6835 Body mass index (BMI) 35.0-35.9, adult: Secondary | ICD-10-CM | POA: Diagnosis not present

## 2023-01-01 IMAGING — CR DG KNEE COMPLETE 4+V*R*
5 series · 5 of 5 positions shown · non-contrast
Comparison: 03/18/2019

CLINICAL DATA: Right knee pain after injury 1 day ago

EXAM:
RIGHT KNEE - COMPLETE 4+ VIEW

[t knee ap right]
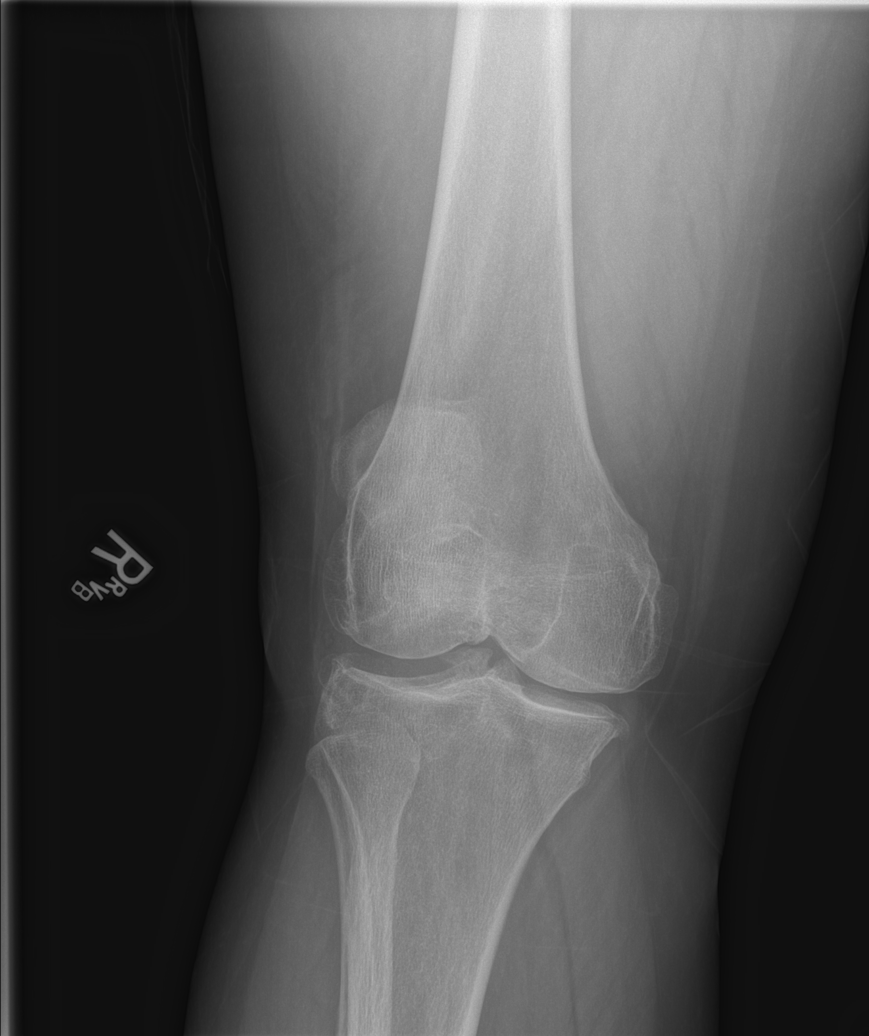

[t knee obl right (1 of 2)]
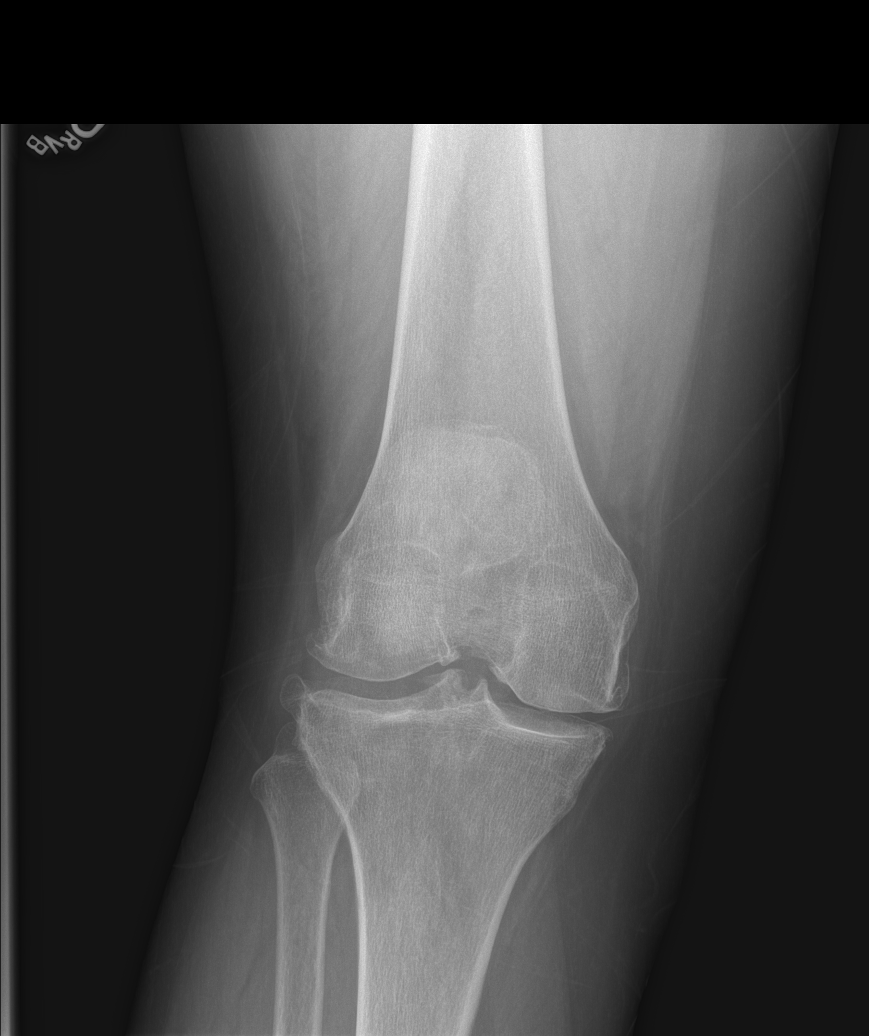

[t knee obl right (2 of 2)]
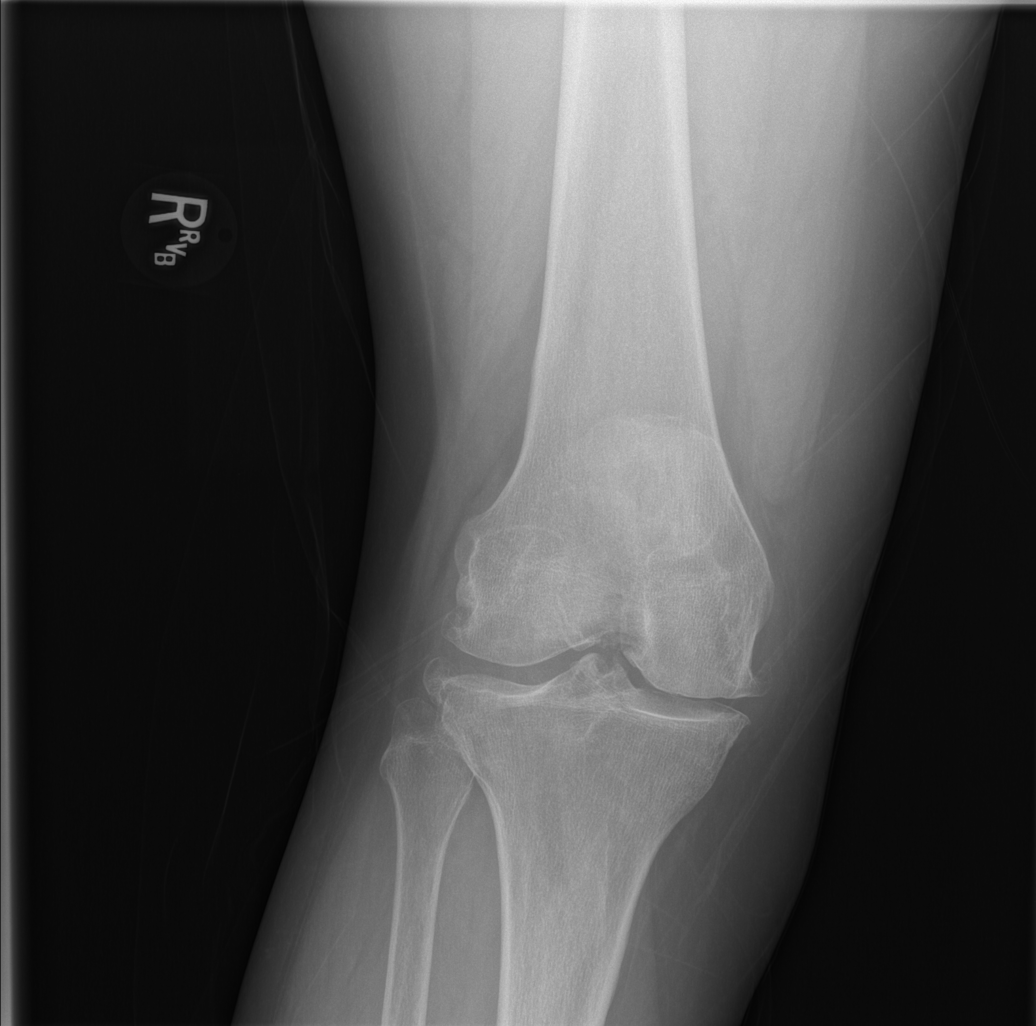

[t knee lat right (1 of 2)]
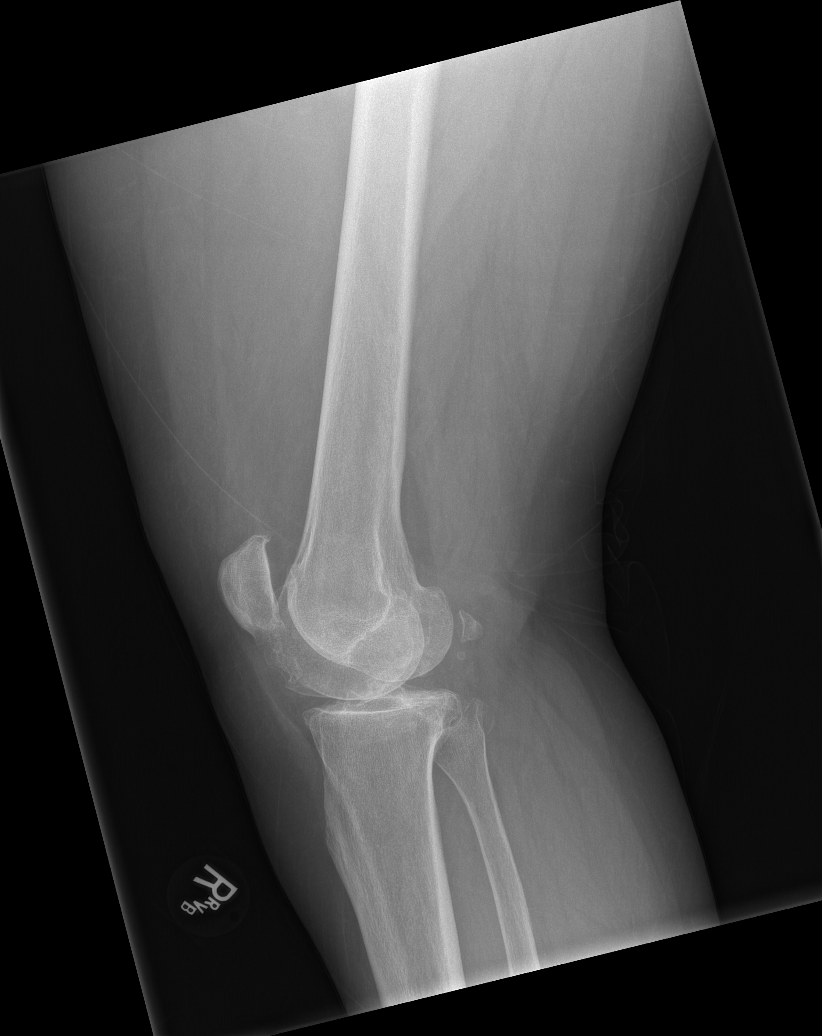

[t knee lat right (2 of 2)]
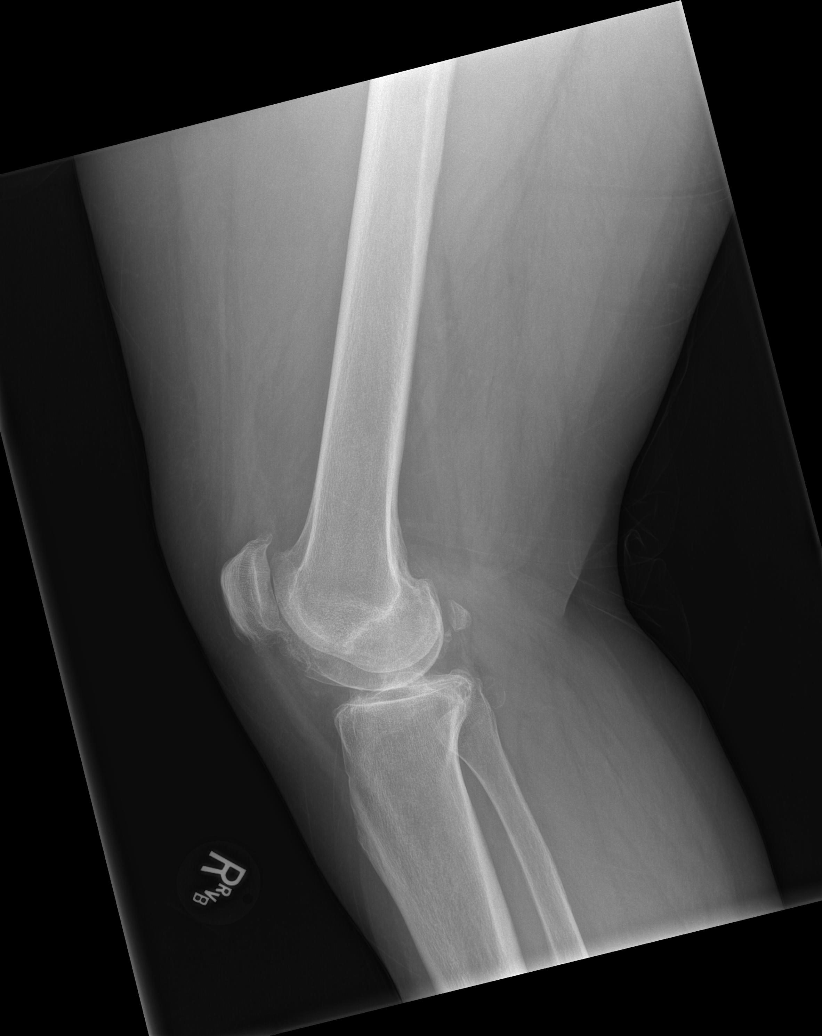

[5 of 5 positions shown; findings below may reference images not displayed]

FINDINGS: No evidence of acute fracture or dislocation. Moderate
tricompartmental osteoarthritis is similar to prior. Small loose
bodies are seen posteriorly, unchanged. No significant joint
effusion. No soft tissue swelling.
IMPRESSION: No acute findings. Moderate tricompartmental osteoarthritis, similar
to prior.

## 2023-03-12 ENCOUNTER — Ambulatory Visit (HOSPITAL_COMMUNITY): Payer: 59 | Admitting: Psychiatry

## 2023-04-07 ENCOUNTER — Other Ambulatory Visit (HOSPITAL_COMMUNITY): Payer: Self-pay | Admitting: Psychiatry

## 2023-04-08 ENCOUNTER — Other Ambulatory Visit (HOSPITAL_COMMUNITY): Payer: Self-pay

## 2023-04-08 MED ORDER — QUETIAPINE FUMARATE 400 MG PO TABS: ORAL_TABLET | ORAL | 0 refills | Status: DC

## 2023-04-23 ENCOUNTER — Other Ambulatory Visit: Payer: Self-pay

## 2023-04-23 ENCOUNTER — Ambulatory Visit (HOSPITAL_BASED_OUTPATIENT_CLINIC_OR_DEPARTMENT_OTHER): Payer: 59 | Admitting: Psychiatry

## 2023-04-23 ENCOUNTER — Encounter (HOSPITAL_COMMUNITY): Payer: Self-pay | Admitting: Psychiatry

## 2023-04-23 VITALS — BP 139/89 | HR 71 | Ht 67.0 in | Wt 230.0 lb

## 2023-04-23 DIAGNOSIS — F324 Major depressive disorder, single episode, in partial remission: Secondary | ICD-10-CM

## 2023-04-23 MED ORDER — QUETIAPINE FUMARATE 300 MG PO TABS: ORAL_TABLET | ORAL | 5 refills | Status: DC

## 2023-04-23 MED ORDER — BUPROPION HCL ER (XL) 150 MG PO TB24: ORAL_TABLET | ORAL | 4 refills | Status: DC

## 2023-04-23 MED ORDER — ALPRAZOLAM 1 MG PO TABS: 1.0000 mg | ORAL_TABLET | Freq: Three times a day (TID) | ORAL | 4 refills | Status: DC

## 2023-04-23 NOTE — Progress Notes (Signed)
BH MD/PA/NP OP Progress Note  11/14/2020 3:16 PM Jill [name]  MRN:  [id number]    Today the patient is doing fairly well.  She says in the last month or so she has gotten very irritable.  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  This is a bit unusual for her.  She has always liked where she works.  There is no particular problem with with her supervisors or with other workers it seems to be generalized.  The only thing that changes are slow reduction of Seroquel.  She is sleeping and eating okay.  She could try to get back to reading.  She denies daily depression.  She has lost about 20 pounds which is great for her.  She had the threat of going on diabetes medicine.  The patient is also going to reach out and contact one of her good friends that she does not do things with.  The patient has a mood disorder that is not clear what the etiology is.  I believe she has major depression and I think it is worsened.  I think that expresses itself by increasing irritability. Visit Diagnosis:  No diagnosis found.  Past Psychiatric History: Please see intake H&P.  Past Medical History:  Past Medical History:  Diagnosis Date   Anxiety    Bipolar 1 disorder (HCC)    Hypertension    Post-traumatic stress syndrome     Past Surgical History:  Procedure Laterality Date   BLADDER SURGERY     CHOLECYSTECTOMY     TUBAL LIGATION      Family Psychiatric History: Reviewed.  Family History:  Family History  Problem Relation Age of Onset   Drug abuse Mother    Alcohol abuse Maternal Uncle    Drug abuse Maternal Uncle     Social History:  Social History   Socioeconomic History   Marital status: Single    Spouse name: Not on file   Number of children: 2   Years of education: 14   Highest education level: Not on file  Occupational History   Not on file  Tobacco Use   Smoking status: Every Day    Current packs/day: 0.00     Types: Cigarettes    Last attempt to quit: 08/29/2016    Years since quitting: 6.6   Smokeless tobacco: Never  Vaping Use   Vaping status: Never Used  Substance and Sexual Activity   Alcohol use: No   Drug use: Not Currently    Types: Marijuana   Sexual activity: Never    Birth control/protection: Surgical  Other Topics Concern   Not on file  Social History Narrative   Pt lives with her son in [place]. Pt works at [employer]. Born and raised in [place] by mom until 7 then by grandparents. Pt has one older brother. 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    Social Determinants of Health   Financial Resource Strain: Not on file  Food Insecurity: Not on file  Transportation Needs: Not on file  Physical Activity: Not on file  Stress: Not on file  Social Connections: Not on file    Allergies:  Allergies  Allergen Reactions   Aspirin Other (See Comments)    Upset stomach    Metabolic Disorder Labs: Lab Results  Component Value Date   HGBA1C 6.7 (H)  09/12/2020   No results found for: "PROLACTIN" Lab Results  Component Value Date   CHOL 151 09/12/2020   TRIG 357 (H) 09/12/2020   HDL 43 09/12/2020   CHOLHDL 3.5 09/12/2020   LDLCALC 54 09/12/2020   LDLCALC 64 09/28/2019   Lab Results  Component Value Date   TSH 0.643 01/14/2020    Therapeutic Level Labs: No results found for: "LITHIUM" No results found for: "VALPROATE" No results found for: "CBMZ"  Current Medications: Current Outpatient Medications  Medication Sig Dispense Refill   buPROPion (WELLBUTRIN XL) 150 MG 24 hr tablet 3 qam 90 tablet 4   cholecalciferol (VITAMIN D) 400 UNITS TABS tablet Take 1,200 Units by mouth 2 (two) times a week. Reported on 11/09/2015     diclofenac sodium (VOLTAREN) 1 % GEL Apply 2 g topically 4 (four) times daily. 100 g 0   fenofibrate micronized (LOFIBRA) 134 MG capsule Take 134 mg by mouth daily.     fluticasone (FLONASE) 50  MCG/ACT nasal spray Place 1 spray into both nostrils daily.     hydrochlorothiazide (HYDRODIURIL) 25 MG tablet Take 25 mg by mouth daily. Reported on 11/09/2015     ibuprofen (ADVIL) 600 MG tablet Take 1 tablet (600 mg total) by mouth every 6 (six) hours as needed. 30 tablet 0   methocarbamol (ROBAXIN) 500 MG tablet Take 1 tablet (500 mg total) by mouth 2 (two) times daily. 20 tablet 0   metoprolol (LOPRESSOR) 50 MG tablet Take 50 mg by mouth 2 (two) times daily.     Potassium Chloride ER 20 MEQ TBCR Take 1 tablet by mouth daily.     ALPRAZolam (XANAX) 1 MG tablet Take 1 tablet (1 mg total) by mouth 3 (three) times daily. 90 tablet 4   atorvastatin (LIPITOR) 10 MG tablet Take 1 tablet (10 mg total) by mouth daily. 30 tablet 11   doxycycline (VIBRAMYCIN) 100 MG capsule Take 1 capsule (100 mg total) by mouth 2 (two) times daily. (Patient not taking: Reported on 04/23/2023) 14 capsule 0   metFORMIN (GLUCOPHAGE XR) 750 MG 24 hr tablet Take 1 tablet (750 mg total) by mouth daily with breakfast. 90 tablet 1   potassium chloride SA (KLOR-CON) 20 MEQ tablet Take 1 tablet (20 mEq total) by mouth daily. 90 tablet 3   QUEtiapine (SEROQUEL) 300 MG tablet Take two tablets (600 mg total) by mouth at bedtime. 60 tablet 5   No current facility-administered medications for this visit.      Psychiatric Specialty Exam: Review of Systems  Psychiatric/Behavioral:  The patient is nervous/anxious.   All other systems reviewed and are negative.   Blood pressure 139/89, pulse 71, height 5\' 7"  (1.702 m), weight 230 lb (104.3 kg), last menstrual period 10/12/2011.Body mass index is 36.02 kg/m.  General Appearance: NA  Eye Contact:  NA  Speech:  Clear and Coherent and Normal Rate  Volume:  Normal  Mood:  Anxious  Affect:  NA  Thought Process:  Goal Directed and Linear  Orientation:  Full (Time, Place, and Person)  Thought Content: Logical   Suicidal Thoughts:  No  Homicidal Thoughts:  No  Memory:  Immediate;    Good Recent;   Good Remote;   Good  Judgement:  Good  Insight:  Good  Psychomotor Activity:  NA  Concentration:  Concentration: Good  Recall:  Good  Fund of Knowledge: Good  Language: Good  Akathisia:  Negative  Handed:  Right  AIMS (if indicated): not done  Assets:  Communication Skills Desire for Improvement Financial Resources/Insurance Housing Resilience Talents/Skills  ADL's:  Intact  Cognition: WNL  Sleep:  Good   Screenings: AIMS    Flowsheet Row [facility]  AIMS Total Score 0      GAD-7    Flowsheet Row Counselor from 02/12/2021 in [facility] at [facility]  Total GAD-7 Score 17      PHQ2-9    Flowsheet Row Counselor from 02/12/2021 in [facility] at [facility] Total Score 2  PHQ-9 Total Score 10      Flowsheet Row ED from 06/07/2022 in [facility] Emergency Department at [facility] ED from 09/25/2021 in [facility] Care at [facility]) ED from 07/17/2021 in [facility] Department at [facility]  C-SSRS RISK CATEGORY No Risk No Risk No Risk        Assessment and Plan:     This patient has been doing very well for the last 6 months to a year.  Now she seems to be getting very irritable.  This is a good job for her.  She knows it.  Her daughter her fianc broke up over a year ago so that is not an issue.  The patient social life is minimal.  She does not get very much vacation time.  She is planning to take a few days and that would be good for her to take a break.  For now we will go ahead and increase her Seroquel back to its original dose of 600 mg.  She will continue taking Xanax 1 mg 3 times daily.  She will continue taking Wellbutrin as well.  This patient she will be seen again in 3 months.  She denies the use of alcohol or drugs. 11/14/2020, 3:16 PM

## 2023-04-28 DIAGNOSIS — M19211 Secondary osteoarthritis, right shoulder: Secondary | ICD-10-CM | POA: Diagnosis not present

## 2023-06-23 ENCOUNTER — Telehealth (HOSPITAL_COMMUNITY): Payer: Self-pay

## 2023-06-23 NOTE — Telephone Encounter (Signed)
Patient is calling to let you know that the Seroquel 600 mg is too strong, she would like to go back to the 400 mg. Please review and advise, thank you

## 2023-06-24 ENCOUNTER — Other Ambulatory Visit (HOSPITAL_COMMUNITY): Payer: Self-pay | Admitting: Psychiatry

## 2023-06-24 MED ORDER — QUETIAPINE FUMARATE 200 MG PO TABS: ORAL_TABLET | ORAL | 5 refills | Status: DC

## 2023-07-15 ENCOUNTER — Other Ambulatory Visit: Payer: Self-pay

## 2023-07-15 ENCOUNTER — Ambulatory Visit (HOSPITAL_BASED_OUTPATIENT_CLINIC_OR_DEPARTMENT_OTHER): Payer: 59 | Admitting: Psychiatry

## 2023-07-15 ENCOUNTER — Encounter (HOSPITAL_COMMUNITY): Payer: Self-pay | Admitting: Psychiatry

## 2023-07-15 VITALS — BP 149/91 | HR 80 | Ht 67.0 in | Wt 235.0 lb

## 2023-07-15 DIAGNOSIS — F325 Major depressive disorder, single episode, in full remission: Secondary | ICD-10-CM

## 2023-07-15 MED ORDER — BUPROPION HCL ER (XL) 150 MG PO TB24: ORAL_TABLET | ORAL | 4 refills | Status: DC

## 2023-07-15 MED ORDER — QUETIAPINE FUMARATE 200 MG PO TABS: ORAL_TABLET | ORAL | 5 refills | Status: DC

## 2023-07-15 MED ORDER — ALPRAZOLAM 1 MG PO TABS: 1.0000 mg | ORAL_TABLET | Freq: Three times a day (TID) | ORAL | 4 refills | Status: DC

## 2023-07-15 NOTE — Progress Notes (Signed)
BH MD/PA/NP OP Progress Note  11/14/2020 4:23 PM Jill [name]  MRN:  [id number]   Today the patient is doing better.  She is less irritable.  She has lost more weight.  She is also increased her social life.  She has reached out to her a few friends.  She is planning to go on a cruise with her daughter and the daughter's niece and her family.  The patient is something to look forward to.  The patient is sleeping and eating well.  She is got good energy.  Work still bothers her at times but she has a good boss that she likes.  She denies anxiety.  The patient first went back to 600 mg of Seroquel but then we shifted it back to 400 mg and she says it is working well.  She knows it helps her sleep and helps her feel calm.  He uses it as a mood stabilizer.  The patient will continue taking Wellbutrin as prescribed.  She also takes a fixed dose of Xanax 1 mg 3 times daily. Visit Diagnosis:  No diagnosis found.  Past Psychiatric History: Please see intake H&P.  Past Medical History:  Past Medical History:  Diagnosis Date   Anxiety    Bipolar 1 disorder (HCC)    Hypertension    Post-traumatic stress syndrome     Past Surgical History:  Procedure Laterality Date   BLADDER SURGERY     CHOLECYSTECTOMY     TUBAL LIGATION      Family Psychiatric History: Reviewed.  Family History:  Family History  Problem Relation Age of Onset   Drug abuse Mother    Alcohol abuse Maternal Uncle    Drug abuse Maternal Uncle     Social History:  Social History   Socioeconomic History   Marital status: Single    Spouse name: Not on file   Number of children: 2   Years of education: 14   Highest education level: Not on file  Occupational History   Not on file  Tobacco Use   Smoking status: Every Day    Current packs/day: 0.00    Types: Cigarettes    Last attempt to quit: 08/29/2016    Years since quitting: 6.8   Smokeless tobacco: Never  Vaping Use   Vaping status: Never Used  Substance and  Sexual Activity   Alcohol use: No   Drug use: Not Currently    Types: Marijuana   Sexual activity: Never    Birth control/protection: Surgical  Other Topics Concern   Not on file  Social History Narrative   Pt lives with her son in [place]. Pt works at [employer]. Born and raised in [place] by mom until 7 then by grandparents. Pt has one older brother. 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    Social Drivers of Corporate investment banker Strain: Not on file  Food Insecurity: Not on file  Transportation Needs: Not on file  Physical Activity: Not on file  Stress: Not on file  Social Connections: Not on file    Allergies:  Allergies  Allergen Reactions   Aspirin Other (See Comments)    Upset stomach    Metabolic Disorder Labs: Lab Results  Component Value Date   HGBA1C 6.7 (H) 09/12/2020   No results found for: "PROLACTIN" Lab Results  Component Value Date   CHOL 151 09/12/2020   TRIG 357 (H) 09/12/2020  HDL 43 09/12/2020   CHOLHDL 3.5 09/12/2020   LDLCALC 54 09/12/2020   LDLCALC 64 09/28/2019   Lab Results  Component Value Date   TSH 0.643 01/14/2020    Therapeutic Level Labs: No results found for: "LITHIUM" No results found for: "VALPROATE" No results found for: "CBMZ"  Current Medications: Current Outpatient Medications  Medication Sig Dispense Refill   ALPRAZolam (XANAX) 1 MG tablet Take 1 tablet (1 mg total) by mouth 3 (three) times daily. 90 tablet 4   atorvastatin (LIPITOR) 10 MG tablet Take 1 tablet (10 mg total) by mouth daily. 30 tablet 11   buPROPion (WELLBUTRIN XL) 150 MG 24 hr tablet 3 qam 90 tablet 4   cholecalciferol (VITAMIN D) 400 UNITS TABS tablet Take 1,200 Units by mouth 2 (two) times a week. Reported on 11/09/2015     diclofenac sodium (VOLTAREN) 1 % GEL Apply 2 g topically 4 (four) times daily. 100 g 0   doxycycline (VIBRAMYCIN) 100 MG capsule Take 1 capsule (100 mg total) by mouth 2  (two) times daily. (Patient not taking: Reported on 04/23/2023) 14 capsule 0   fenofibrate micronized (LOFIBRA) 134 MG capsule Take 134 mg by mouth daily.     fluticasone (FLONASE) 50 MCG/ACT nasal spray Place 1 spray into both nostrils daily.     hydrochlorothiazide (HYDRODIURIL) 25 MG tablet Take 25 mg by mouth daily. Reported on 11/09/2015     ibuprofen (ADVIL) 600 MG tablet Take 1 tablet (600 mg total) by mouth every 6 (six) hours as needed. 30 tablet 0   metFORMIN (GLUCOPHAGE XR) 750 MG 24 hr tablet Take 1 tablet (750 mg total) by mouth daily with breakfast. 90 tablet 1   methocarbamol (ROBAXIN) 500 MG tablet Take 1 tablet (500 mg total) by mouth 2 (two) times daily. 20 tablet 0   metoprolol (LOPRESSOR) 50 MG tablet Take 50 mg by mouth 2 (two) times daily.     Potassium Chloride ER 20 MEQ TBCR Take 1 tablet by mouth daily.     potassium chloride SA (KLOR-CON) 20 MEQ tablet Take 1 tablet (20 mEq total) by mouth daily. 90 tablet 3   QUEtiapine (SEROQUEL) 200 MG tablet 2 qhs 60 tablet 5   No current facility-administered medications for this visit.      Psychiatric Specialty Exam: Review of Systems  Psychiatric/Behavioral:  The patient is nervous/anxious.   All other systems reviewed and are negative.   Blood pressure (!) 149/91, pulse 80, height 5\' 7"  (1.702 m), weight 235 lb (106.6 kg), last menstrual period 10/12/2011.Body mass index is 36.81 kg/m.  General Appearance: NA  Eye Contact:  NA  Speech:  Clear and Coherent and Normal Rate  Volume:  Normal  Mood:  Anxious  Affect:  NA  Thought Process:  Goal Directed and Linear  Orientation:  Full (Time, Place, and Person)  Thought Content: Logical   Suicidal Thoughts:  No  Homicidal Thoughts:  No  Memory:  Immediate;   Good Recent;   Good Remote;   Good  Judgement:  Good  Insight:  Good  Psychomotor Activity:  NA  Concentration:  Concentration: Good  Recall:  Good  Fund of Knowledge: Good  Language: Good  Akathisia:   Negative  Handed:  Right  AIMS (if indicated): not done  Assets:  Communication Skills Desire for Improvement Financial Resources/Insurance Housing Resilience Talents/Skills  ADL's:  Intact  Cognition: WNL  Sleep:  Good   Screenings: AIMS    Flowsheet Row [facility]22[facility]2018  [facility]  AIMS Total Score 0      GAD-7    Flowsheet Row Counselor from 02/12/2021 in [facility] at [facility]  Total GAD-7 Score 17      PHQ2-9    Flowsheet Row Counselor from 02/12/2021 in [facility] at [facility] Total Score 2  PHQ-9 Total Score 10      Flowsheet Row ED from 06/07/2022 in [facility] Department at [facility] ED from 09/25/2021 in [facility] Urgent Care at [facility] ED from 07/17/2021 in [facility] Emergency Department at [facility]  C-SSRS RISK CATEGORY No Risk No Risk No Risk        Assessment and Plan:     This patient's diagnosis is major depression.  She will continue taking Wellbutrin as prescribed and adjunct treatment with Lamictal.  She also takes adjunct treatment taking 400 mg of Seroquel.  She demonstrates no evidence of tardive dyskinesia.  She has lost weight and she feels good about it.  The patient has an adjustment disorder with an anxious mood state which is very well controlled with Xanax 1 mg 3 times daily.  The patient denies use of alcohol or drugs.  She lives alone she is independent and she is functioning very well.  She will return to see me in 4 months. 11/14/2020, 4:23 PM

## 2023-10-18 ENCOUNTER — Other Ambulatory Visit: Payer: Self-pay

## 2023-10-18 ENCOUNTER — Emergency Department (HOSPITAL_COMMUNITY)
Admission: EM | Admit: 2023-10-18 | Discharge: 2023-10-18 | Disposition: A | Discharge status: Home / Self Care | Attending: Emergency Medicine | Admitting: Emergency Medicine

## 2023-10-18 ENCOUNTER — Emergency Department (HOSPITAL_COMMUNITY)

## 2023-10-18 DIAGNOSIS — M79671 Pain in right foot: Secondary | ICD-10-CM | POA: Diagnosis present

## 2023-10-18 DIAGNOSIS — M6528 Calcific tendinitis, other site: Secondary | ICD-10-CM | POA: Diagnosis not present

## 2023-10-18 DIAGNOSIS — I10 Essential (primary) hypertension: Secondary | ICD-10-CM | POA: Diagnosis not present

## 2023-10-18 DIAGNOSIS — Z79899 Other long term (current) drug therapy: Secondary | ICD-10-CM | POA: Diagnosis not present

## 2023-10-18 DIAGNOSIS — M7660 Achilles tendinitis, unspecified leg: Secondary | ICD-10-CM

## 2023-10-18 MED ORDER — ACETAMINOPHEN 325 MG PO TABS
650.0000 mg | ORAL_TABLET | Freq: Once | ORAL | Status: AC
  Administered 2023-10-18: 650 mg via ORAL
  Filled 2023-10-18: qty 2

## 2023-10-18 MED ORDER — ACETAMINOPHEN 325 MG PO TABS
ORAL_TABLET | ORAL | Status: AC
  Filled 2023-10-18: qty 1

## 2023-10-18 NOTE — Discharge Instructions (Addendum)
 The x-ray showed that you have inflammation in the Achilles which is causing the pain in the back of your heel.  Take over-the-counter medications for pain to help with the discomfort.  Follow-up with the orthopedic doctor for further patient

## 2023-10-18 NOTE — ED Provider Notes (Signed)
 [place] EMERGENCY DEPARTMENT AT [facility] Provider Note   CSN: [id number] Arrival date & time: 10/18/23  4098     History {Add pertinent medical, surgical, social history, OB history to HPI:1} Chief Complaint  Patient presents with   Foot Pain    Jill [name] is a 58 y.o. female.   Foot Pain     Patient has a history of hypertension anxiety bipolar disorder.  She presents ED for several months of pain on her heels.  Patient states she is felt a lump on both sides.  Right is worse than the left.  Patient denies any falls or injuries.  No fevers or chills  Home Medications Prior to Admission medications   Medication Sig Start Date End Date Taking? Authorizing Provider  ALPRAZolam Prudy Feeler) 1 MG tablet Take 1 tablet (1 mg total) by mouth 3 (three) times daily. 07/15/23 07/14/24  Plovsky, Earvin Hansen, MD  atorvastatin (LIPITOR) 10 MG tablet Take 1 tablet (10 mg total) by mouth daily. 09/13/20 09/13/21  Pucilowski, Roosvelt Maser, MD  buPROPion (WELLBUTRIN XL) 150 MG 24 hr tablet 3 qam 07/15/23   Plovsky, Earvin Hansen, MD  cholecalciferol (VITAMIN D) 400 UNITS TABS tablet Take 1,200 Units by mouth 2 (two) times a week. Reported on 11/09/2015    [provider]  diclofenac sodium (VOLTAREN) 1 % GEL Apply 2 g topically 4 (four) times daily. 03/18/19   Jeannie Fend, PA-C  doxycycline (VIBRAMYCIN) 100 MG capsule Take 1 capsule (100 mg total) by mouth 2 (two) times daily. Patient not taking: Reported on 04/23/2023 06/07/22   Benjiman Core, MD  fenofibrate micronized (LOFIBRA) 134 MG capsule Take 134 mg by mouth daily. 09/18/21   [provider]  fluticasone (FLONASE) 50 MCG/ACT nasal spray Place 1 spray into both nostrils daily. 07/11/21   [provider]  hydrochlorothiazide (HYDRODIURIL) 25 MG tablet Take 25 mg by mouth daily. Reported on 11/09/2015    [provider]  ibuprofen (ADVIL) 600 MG tablet Take 1 tablet (600 mg total) by mouth every 6 (six)  hours as needed. 09/15/19   Fayrene Helper, PA-C  metFORMIN (GLUCOPHAGE XR) 750 MG 24 hr tablet Take 1 tablet (750 mg total) by mouth daily with breakfast. 01/05/20 07/03/20  Pucilowski, Roosvelt Maser, MD  methocarbamol (ROBAXIN) 500 MG tablet Take 1 tablet (500 mg total) by mouth 2 (two) times daily. 02/02/19   Hyman Hopes, Margaux, PA-C  metoprolol (LOPRESSOR) 50 MG tablet Take 50 mg by mouth 2 (two) times daily.    [provider]  Potassium Chloride ER 20 MEQ TBCR Take 1 tablet by mouth daily. 09/18/21   [provider]  potassium chloride SA (KLOR-CON) 20 MEQ tablet Take 1 tablet (20 mEq total) by mouth daily. 04/21/20 07/20/20  Little Ishikawa, MD  QUEtiapine (SEROQUEL) 200 MG tablet 2 qhs 07/15/23   Archer Asa, MD      Allergies    Aspirin    Review of Systems   Review of Systems  Physical Exam Updated Vital Signs BP (!) 152/97 (BP Location: Right Arm)   Pulse 87   Temp 97.8 F (36.6 C) (Oral)   Resp 16   Ht 1.702 m (5\' 7" )   Wt 105.2 kg   LMP 10/12/2011   SpO2 98%   BMI 36.34 kg/m  Physical Exam Vitals and nursing note reviewed.  Constitutional:      General: She is not in acute distress.    Appearance: She is well-developed.  HENT:  Head: Normocephalic and atraumatic.     Right Ear: External ear normal.     Left Ear: External ear normal.  Eyes:     General: No scleral icterus.       Right eye: No discharge.        Left eye: No discharge.     Conjunctiva/sclera: Conjunctivae normal.  Neck:     Trachea: No tracheal deviation.  Cardiovascular:     Rate and Rhythm: Normal rate.  Pulmonary:     Effort: Pulmonary effort is normal. No respiratory distress.     Breath sounds: No stridor.  Abdominal:     General: There is no distension.  Musculoskeletal:        General: Tenderness present. No swelling or deformity.     Cervical back: Neck supple.     Comments: Tenderness palpation bilateral calcaneus at the insertion of the Achilles, no swelling, no  edema  Skin:    General: Skin is warm and dry.     Findings: No rash.  Neurological:     Mental Status: She is alert. Mental status is at baseline.     Cranial Nerves: No dysarthria or facial asymmetry.     Motor: No seizure activity.     ED Results / Procedures / Treatments   Labs (all labs ordered are listed, but only abnormal results are displayed) Labs Reviewed - No data to display  EKG None  Radiology No results found.  Procedures Procedures  {Document cardiac monitor, telemetry assessment procedure when appropriate:1}  Medications Ordered in ED Medications  acetaminophen (TYLENOL) tablet 650 mg (has no administration in time range)    ED Course/ Medical Decision Making/ A&P   {   Click here for ABCD2, HEART and other calculatorsREFRESH Note before signing :1}                              Medical Decision Making Amount and/or Complexity of Data Reviewed Radiology: ordered.  Risk OTC drugs.   ***  {Document critical care time when appropriate:1} {Document review of labs and clinical decision tools ie heart score, Chads2Vasc2 etc:1}  {Document your independent review of radiology images, and any outside records:1} {Document your discussion with family members, caretakers, and with consultants:1} {Document social determinants of health affecting pt's care:1} {Document your decision making why or why not admission, treatments were needed:1} Final Clinical Impression(s) / ED Diagnoses Final diagnoses:  None    Rx / DC Orders ED Discharge Orders     None

## 2023-10-18 NOTE — ED Triage Notes (Signed)
 Pt arrived via POV. C/o bilat heel pain for 6x months.  Ambulatory

## 2023-11-13 ENCOUNTER — Encounter: Payer: Self-pay | Admitting: Family Medicine

## 2023-11-13 ENCOUNTER — Ambulatory Visit: Payer: Self-pay | Admitting: Family Medicine

## 2023-11-13 VITALS — BP 120/80 | HR 77 | Temp 97.9°F | Ht 67.0 in | Wt 230.0 lb

## 2023-11-13 DIAGNOSIS — M79672 Pain in left foot: Secondary | ICD-10-CM

## 2023-11-13 DIAGNOSIS — F3131 Bipolar disorder, current episode depressed, mild: Secondary | ICD-10-CM | POA: Diagnosis not present

## 2023-11-13 DIAGNOSIS — E66812 Obesity, class 2: Secondary | ICD-10-CM

## 2023-11-13 DIAGNOSIS — Z1231 Encounter for screening mammogram for malignant neoplasm of breast: Secondary | ICD-10-CM

## 2023-11-13 DIAGNOSIS — E1165 Type 2 diabetes mellitus with hyperglycemia: Secondary | ICD-10-CM

## 2023-11-13 DIAGNOSIS — E1169 Type 2 diabetes mellitus with other specified complication: Secondary | ICD-10-CM | POA: Diagnosis not present

## 2023-11-13 DIAGNOSIS — Z2821 Immunization not carried out because of patient refusal: Secondary | ICD-10-CM

## 2023-11-13 DIAGNOSIS — N3090 Cystitis, unspecified without hematuria: Secondary | ICD-10-CM | POA: Diagnosis not present

## 2023-11-13 DIAGNOSIS — Z6836 Body mass index (BMI) 36.0-36.9, adult: Secondary | ICD-10-CM

## 2023-11-13 DIAGNOSIS — Z7689 Persons encountering health services in other specified circumstances: Secondary | ICD-10-CM

## 2023-11-13 DIAGNOSIS — I10 Essential (primary) hypertension: Secondary | ICD-10-CM

## 2023-11-13 DIAGNOSIS — Z1159 Encounter for screening for other viral diseases: Secondary | ICD-10-CM

## 2023-11-13 DIAGNOSIS — E785 Hyperlipidemia, unspecified: Secondary | ICD-10-CM

## 2023-11-13 LAB — POCT URINALYSIS DIP (CLINITEK)
Glucose, UA: NEGATIVE mg/dL
Ketones, POC UA: NEGATIVE mg/dL
Leukocytes, UA: NEGATIVE
Nitrite, UA: POSITIVE — AB
POC PROTEIN,UA: 30 — AB
Spec Grav, UA: >=1.03 — AB (ref 1.010–1.025)
Urobilinogen, UA: 0.2 U/dL
pH, UA: 5.5 (ref 5.0–8.0)

## 2023-11-13 MED ORDER — CIPROFLOXACIN HCL 500 MG PO TABS: 500.0000 mg | ORAL_TABLET | Freq: Two times a day (BID) | ORAL | 0 refills | Status: AC

## 2023-11-13 NOTE — Progress Notes (Addendum)
 I,Jameka J Llittleton, CMA,acting as a Neurosurgeon for [employer], NP.,have documented all relevant documentation on the behalf of Melodie Spry, NP,as directed by  Melodie Spry, NP while in the presence of Melodie Spry, NP.  Subjective:  Patient ID: Jill [name] , female    DOB: 04-18-1966 , 58 y.o.   MRN: [id number]  Chief Complaint  Patient presents with   Establish Care   big toe   Foot Pain    HPI  Patient is a 58 year old female who  presents today to establish primary care. She has a medical diagnosis of Bipolar disorder, Hypertension, Diabetes that is diet controlled. She would like to be evaluated for heel pain. She reports the pain started 3 months ago and now the  pain has worsened  Foot Pain This is a chronic problem. The current episode started more than 1 month ago. The problem occurs daily. The problem has been waxing and waning. Pertinent negatives include no abdominal pain, arthralgias, fever, headaches, joint swelling or myalgias. The symptoms are aggravated by smoking. She has tried acetaminophen  for the symptoms. The treatment provided no relief.     Past Medical History:  Diagnosis Date   Anxiety    Bipolar 1 disorder (HCC)    Hypertension    Post-traumatic stress syndrome      Family History  Problem Relation Age of Onset   Drug abuse Mother    Alcohol abuse Maternal Uncle    Drug abuse Maternal Uncle      Current Outpatient Medications:    ALPRAZolam  (XANAX ) 1 MG tablet, Take 1 tablet (1 mg total) by mouth 3 (three) times daily., Disp: 90 tablet, Rfl: 4   atorvastatin  (LIPITOR) 10 MG tablet, Take 1 tablet (10 mg total) by mouth daily., Disp: 30 tablet, Rfl: 11   buPROPion  (WELLBUTRIN  XL) 150 MG 24 hr tablet, 3 qam, Disp: 90 tablet, Rfl: 4   cholecalciferol  (VITAMIN D) 400 UNITS TABS tablet, Take 1,200 Units by mouth 2 (two) times a week. Reported on 11/09/2015, Disp: , Rfl:    diclofenac  sodium (VOLTAREN ) 1 % GEL, Apply 2 g topically 4 (four) times daily.,  Disp: 100 g, Rfl: 0   fenofibrate micronized (LOFIBRA) 134 MG capsule, Take 134 mg by mouth daily., Disp: , Rfl:    fluticasone (FLONASE) 50 MCG/ACT nasal spray, Place 1 spray into both nostrils daily., Disp: , Rfl:    hydrochlorothiazide  (HYDRODIURIL ) 25 MG tablet, Take 25 mg by mouth daily. Reported on 11/09/2015, Disp: , Rfl:    ibuprofen  (ADVIL ) 600 MG tablet, Take 1 tablet (600 mg total) by mouth every 6 (six) hours as needed., Disp: 30 tablet, Rfl: 0   metoprolol  (LOPRESSOR ) 50 MG tablet, Take 50 mg by mouth 2 (two) times daily., Disp: , Rfl:    Potassium Chloride  ER 20 MEQ TBCR, Take 1 tablet by mouth daily., Disp: , Rfl:    QUEtiapine  (SEROQUEL ) 200 MG tablet, 2 qhs, Disp: 60 tablet, Rfl: 5   doxycycline  (VIBRAMYCIN ) 100 MG capsule, Take 1 capsule (100 mg total) by mouth 2 (two) times daily., Disp: 14 capsule, Rfl: 0   metoprolol  succinate (TOPROL -XL) 50 MG 24 hr tablet, Take 50 mg by mouth 2 (two) times daily., Disp: , Rfl:    nitroGLYCERIN  (NITRO-DUR ) 0.2 mg/hr patch, Place 1 patch (0.2 mg total) onto the skin daily., Disp: 30 patch, Rfl: 12   Allergies  Allergen Reactions   Aspirin Other (See Comments)    Upset stomach  Review of Systems  Constitutional:  Negative for fever.  Gastrointestinal:  Negative for abdominal pain.  Musculoskeletal:  Negative for arthralgias, joint swelling and myalgias.  Neurological:  Negative for headaches.     Today's Vitals   11/13/23 1457  BP: 120/80  Pulse: 77  Temp: 97.9 F (36.6 C)  TempSrc: Oral  Weight: 230 lb (104.3 kg)  Height: 5\' 7"  (1.702 m)  PainSc: 5   PainLoc: Foot   Body mass index is 36.02 kg/m.  Wt Readings from Last 3 Encounters:  11/18/23 230 lb (104.3 kg)  11/13/23 230 lb (104.3 kg)  10/18/23 232 lb (105.2 kg)    The 10-year ASCVD risk score (Arnett DK, et al., 2019) is: 30.1%   Values used to calculate the score:     Age: 31 years     Sex: Female     Is Non-Hispanic African American: Yes     Diabetic:  Yes     Tobacco smoker: Yes     Systolic Blood Pressure: 152 mmHg     Is BP treated: Yes     HDL Cholesterol: 56 mg/dL     Total Cholesterol: 132 mg/dL  Objective:  Physical Exam HENT:     Head: Normocephalic.  Cardiovascular:     Rate and Rhythm: Normal rate and regular rhythm.  Pulmonary:     Effort: Pulmonary effort is normal.     Breath sounds: Normal breath sounds.  Skin:    General: Skin is warm and dry.  Neurological:     General: No focal deficit present.     Mental Status: She is alert and oriented to person, place, and time.  Psychiatric:        Mood and Affect: Mood normal.         Assessment And Plan:  Encounter to establish care with new doctor  Pneumococcal vaccination declined  Herpes zoster vaccination declined  Bipolar 1 disorder, depressed, mild (HCC) Assessment & Plan: Followed by Dr Levie Ream   Primary hypertension Assessment & Plan: Continue hydrochlorothiazide  25 mg every day  Orders: -     Microalbumin / creatinine urine ratio -     POCT URINALYSIS DIP (CLINITEK)  Encounter for hepatitis C screening test for low risk patient -     Hepatitis C antibody  Pain of left heel -     Ambulatory referral to Podiatry  Screening mammogram for breast cancer -     Digital Screening Mammogram, Left and Right; Future  Dyslipidemia due to type 2 diabetes mellitus [facility]) Assessment & Plan: Lipid Panel     Component Value Date/Time   CHOL 132 11/13/2023 1539   TRIG 93 11/13/2023 1539   HDL 56 11/13/2023 1539   CHOLHDL 2.4 11/13/2023 1539   LDLCALC 58 11/13/2023 1539   LABVLDL 18 11/13/2023 1539   Continue Lipitor 10 mg every day   Orders: -     Lipid panel  Type 2 diabetes mellitus with hyperglycemia, without long-term current use of insulin (HCC) -     Hemoglobin A1c -     CBC with Differential/Platelet -     CMP14+EGFR  Cystitis -     POCT URINALYSIS DIP (CLINITEK) -     Ciprofloxacin  HCl; Take 1 tablet (500 mg total) by mouth 2  (two) times daily for 3 days.  Dispense: 6 tablet; Refill: 0  Class 2 obesity with body mass index (BMI) of 36.0 to 36.9 in adult, unspecified obesity type, unspecified whether serious comorbidity present Assessment &  Plan: She is encouraged to strive for BMI less than 30 to decrease cardiac risk. Advised to aim for at least 150 minutes of exercise per week.      Return in 4 months (on 03/14/2024), or if symptoms worsen or fail to improve, for physical.  Patient was given opportunity to ask questions. Patient verbalized understanding of the plan and was able to repeat key elements of the plan. All questions were answered to their satisfaction.    I, Melodie Spry, NP, have reviewed all documentation for this visit. The documentation on 11/24/2023 for the exam, diagnosis, procedures, and orders are all accurate and complete.   IF YOU HAVE BEEN REFERRED TO A SPECIALIST, IT MAY TAKE 1-2 WEEKS TO SCHEDULE/PROCESS THE REFERRAL. IF YOU HAVE NOT HEARD FROM US /SPECIALIST IN TWO WEEKS, PLEASE GIVE US  A CALL AT [phone].

## 2023-11-14 LAB — LIPID PANEL
Chol/HDL Ratio: 2.4 ratio (ref 0.0–4.4)
Cholesterol, Total: 132 mg/dL (ref 100–199)
HDL: 56 mg/dL (ref >39)
LDL Chol Calc (NIH): 58 mg/dL (ref 0–99)
Triglycerides: 93 mg/dL (ref 0–149)
VLDL Cholesterol Cal: 18 mg/dL (ref 5–40)

## 2023-11-14 LAB — CBC WITH DIFFERENTIAL/PLATELET
Basophils Absolute: 0.1 10*3/uL (ref 0.0–0.2)
Basos: 0 %
EOS (ABSOLUTE): 0.1 10*3/uL (ref 0.0–0.4)
Eos: 1 %
Hematocrit: 38.1 % (ref 34.0–46.6)
Hemoglobin: 12.7 g/dL (ref 11.1–15.9)
Immature Grans (Abs): 0 10*3/uL (ref 0.0–0.1)
Immature Granulocytes: 0 %
Lymphocytes Absolute: 3.4 10*3/uL — ABNORMAL HIGH (ref 0.7–3.1)
Lymphs: 28 %
MCH: 31.1 pg (ref 26.6–33.0)
MCHC: 33.3 g/dL (ref 31.5–35.7)
MCV: 93 fL (ref 79–97)
Monocytes Absolute: 0.9 10*3/uL (ref 0.1–0.9)
Monocytes: 7 %
Neutrophils Absolute: 7.8 10*3/uL — ABNORMAL HIGH (ref 1.4–7.0)
Neutrophils: 64 %
Platelets: 309 10*3/uL (ref 150–450)
RBC: 4.08 x10E6/uL (ref 3.77–5.28)
RDW: 12.8 % (ref 11.7–15.4)
WBC: 12.2 10*3/uL — ABNORMAL HIGH (ref 3.4–10.8)

## 2023-11-14 LAB — HEMOGLOBIN A1C
Est. average glucose Bld gHb Est-mCnc: 134 mg/dL
Hgb A1c MFr Bld: 6.3 % — ABNORMAL HIGH (ref 4.8–5.6)

## 2023-11-14 LAB — CMP14+EGFR
ALT: 23 IU/L (ref 0–32)
AST: 19 IU/L (ref 0–40)
Albumin: 4.4 g/dL (ref 3.8–4.9)
Alkaline Phosphatase: 96 IU/L (ref 44–121)
BUN/Creatinine Ratio: 10 (ref 9–23)
BUN: 9 mg/dL (ref 6–24)
Bilirubin Total: 0.5 mg/dL (ref 0.0–1.2)
CO2: 21 mmol/L (ref 20–29)
Calcium: 10.1 mg/dL (ref 8.7–10.2)
Chloride: 101 mmol/L (ref 96–106)
Creatinine, Ser: 0.92 mg/dL (ref 0.57–1.00)
Globulin, Total: 2.8 g/dL (ref 1.5–4.5)
Glucose: 91 mg/dL (ref 70–99)
Potassium: 3.6 mmol/L (ref 3.5–5.2)
Sodium: 140 mmol/L (ref 134–144)
Total Protein: 7.2 g/dL (ref 6.0–8.5)
eGFR: 72 mL/min/{1.73_m2} (ref >59)

## 2023-11-14 LAB — MICROALBUMIN / CREATININE URINE RATIO
Creatinine, Urine: 368.6 mg/dL
Microalb/Creat Ratio: 4 mg/g{creat} (ref 0–29)
Microalbumin, Urine: 13.7 ug/mL

## 2023-11-14 LAB — HEPATITIS C ANTIBODY: Hep C Virus Ab: NONREACTIVE

## 2023-11-18 ENCOUNTER — Ambulatory Visit (HOSPITAL_COMMUNITY): Payer: 59 | Admitting: Psychiatry

## 2023-11-18 ENCOUNTER — Encounter (HOSPITAL_COMMUNITY): Payer: Self-pay

## 2023-11-18 ENCOUNTER — Emergency Department (HOSPITAL_COMMUNITY)
Admission: EM | Admit: 2023-11-18 | Discharge: 2023-11-18 | Disposition: A | Discharge status: Home / Self Care | Attending: Emergency Medicine | Admitting: Emergency Medicine

## 2023-11-18 ENCOUNTER — Other Ambulatory Visit: Payer: Self-pay

## 2023-11-18 DIAGNOSIS — I10 Essential (primary) hypertension: Secondary | ICD-10-CM | POA: Diagnosis not present

## 2023-11-18 DIAGNOSIS — R1030 Lower abdominal pain, unspecified: Secondary | ICD-10-CM | POA: Diagnosis present

## 2023-11-18 DIAGNOSIS — Z79899 Other long term (current) drug therapy: Secondary | ICD-10-CM | POA: Insufficient documentation

## 2023-11-18 DIAGNOSIS — N764 Abscess of vulva: Secondary | ICD-10-CM

## 2023-11-18 DIAGNOSIS — L03314 Cellulitis of groin: Secondary | ICD-10-CM | POA: Diagnosis not present

## 2023-11-18 MED ORDER — LIDOCAINE-EPINEPHRINE 1 %-1:100000 IJ SOLN
10.0000 mL | Freq: Once | INTRAMUSCULAR | Status: DC
  Filled 2023-11-18: qty 10

## 2023-11-18 MED ORDER — LIDOCAINE-EPINEPHRINE (PF) 2 %-1:200000 IJ SOLN
10.0000 mL | Freq: Once | INTRAMUSCULAR | Status: AC
  Administered 2023-11-18: 10 mL
  Filled 2023-11-18: qty 20

## 2023-11-18 MED ORDER — KETOROLAC TROMETHAMINE 15 MG/ML IJ SOLN
15.0000 mg | Freq: Once | INTRAMUSCULAR | Status: AC
  Administered 2023-11-18: 15 mg via INTRAMUSCULAR
  Filled 2023-11-18: qty 1

## 2023-11-18 MED ORDER — DOXYCYCLINE HYCLATE 100 MG PO CAPS: 100.0000 mg | ORAL_CAPSULE | Freq: Two times a day (BID) | ORAL | 0 refills | Status: DC

## 2023-11-18 MED ORDER — DOXYCYCLINE HYCLATE 100 MG PO TABS
100.0000 mg | ORAL_TABLET | Freq: Once | ORAL | Status: AC
  Administered 2023-11-18: 100 mg via ORAL
  Filled 2023-11-18: qty 1

## 2023-11-18 MED ORDER — OXYCODONE-ACETAMINOPHEN 5-325 MG PO TABS
1.0000 | ORAL_TABLET | Freq: Once | ORAL | Status: AC
  Administered 2023-11-18: 1 via ORAL
  Filled 2023-11-18: qty 1

## 2023-11-18 NOTE — ED Provider Triage Note (Signed)
 Emergency Medicine Provider Triage Evaluation Note  Jill [name] , a 58 y.o. female  was evaluated in triage.  Pt complains of pain and swelling to R vulva. States that started as a little bump and has gotten progressively larger. Denies any hx of DM. Denies fevers, chills, or IVDU.Jill [name]  Review of Systems  Positive: rash Negative: fever  Physical Exam  BP (!) 152/111   Temp 99.2 F (37.3 C) (Oral)   Resp 18   Ht 5\' 7"  (1.702 m)   Wt 104.3 kg   LMP 10/12/2011   SpO2 100%   BMI 36.02 kg/m  Gen:   Awake, no distress   Resp:  Normal effort  MSK:   Moves extremities without difficulty  Other:  Declined sensitive exam in triage  Medical Decision Making  Medically screening exam initiated at 4:43 PM.  Appropriate orders placed.  Jill [name] was informed that the remainder of the evaluation will be completed by another provider, this initial triage assessment does not replace that evaluation, and the importance of remaining in the ED until their evaluation is complete.     Timmy Forbes, [place] 11/18/23 1643

## 2023-11-18 NOTE — ED Triage Notes (Signed)
 Vaginal abscess that pt has had since Thursday. Has gotten bigger each day. C/o pain. No other symptoms

## 2023-11-18 NOTE — Discharge Instructions (Addendum)
 You have an infection, along your groin, I have drained the area, it should continue to drain, for the next few days.  Please follow-up with your primary care doctor, take the antibiotics as prescribed.  If you feel like the area is getting larger, you develop a fever, chills, or changes in the skin color down in your private region, please return to the ER immediately.

## 2023-11-18 NOTE — ED Provider Notes (Signed)
 [place] EMERGENCY DEPARTMENT AT [facility] Provider Note   CSN: [id number] Arrival date & time: 11/18/23  1623     History  Chief Complaint  Patient presents with   Abscess    Jill [name] is a 58 y.o. female, history of hypertention, no other pertinent past medical history, who presents to the ED secondary to pain, around her left groin, it has been going on for the last couple days.  She states this started as a size of a grape, has gotten much larger, states it hurts to sit, because the area is so warm and tender to the touch.  Denies any fevers, chills, no recent injuries to the area.  Notes that she has been celibate for 15 years.  States she is not diabetic, does not use IV drugs, and is on no immunosuppressive agents.  Home Medications Prior to Admission medications   Medication Sig Start Date End Date Taking? Authorizing Provider  doxycycline  (VIBRAMYCIN ) 100 MG capsule Take 1 capsule (100 mg total) by mouth 2 (two) times daily. 11/18/23  Yes Joanathan Affeldt L, PA  ALPRAZolam  (XANAX ) 1 MG tablet Take 1 tablet (1 mg total) by mouth 3 (three) times daily. 07/15/23 07/14/24  Plovsky, Doroteo Gasmen, MD  atorvastatin  (LIPITOR) 10 MG tablet Take 1 tablet (10 mg total) by mouth daily. 09/13/20 11/13/23  Pucilowski, Olgierd A, MD  buPROPion  (WELLBUTRIN  XL) 150 MG 24 hr tablet 3 qam 07/15/23   Plovsky, Doroteo Gasmen, MD  cholecalciferol  (VITAMIN D) 400 UNITS TABS tablet Take 1,200 Units by mouth 2 (two) times a week. Reported on 11/09/2015    [provider]  diclofenac  sodium (VOLTAREN ) 1 % GEL Apply 2 g topically 4 (four) times daily. 03/18/19   Darlis Eisenmenger, PA-C  fenofibrate micronized (LOFIBRA) 134 MG capsule Take 134 mg by mouth daily. 09/18/21   [provider]  fluticasone (FLONASE) 50 MCG/ACT nasal spray Place 1 spray into both nostrils daily. 07/11/21   [provider]  hydrochlorothiazide  (HYDRODIURIL ) 25 MG tablet Take 25 mg by mouth daily. Reported on  11/09/2015    [provider]  ibuprofen  (ADVIL ) 600 MG tablet Take 1 tablet (600 mg total) by mouth every 6 (six) hours as needed. 09/15/19   Debbra Fairy, PA-C  methocarbamol  (ROBAXIN ) 500 MG tablet Take 1 tablet (500 mg total) by mouth 2 (two) times daily. Patient not taking: Reported on 11/13/2023 02/02/19   Venter, Margaux, PA-C  metoprolol  (LOPRESSOR ) 50 MG tablet Take 50 mg by mouth 2 (two) times daily.    [provider]  Potassium Chloride  ER 20 MEQ TBCR Take 1 tablet by mouth daily. 09/18/21   [provider]  QUEtiapine  (SEROQUEL ) 200 MG tablet 2 qhs 07/15/23   Alba Ally, MD      Allergies    Aspirin    Review of Systems   Review of Systems  Constitutional:  Negative for fever.  Skin:  Positive for rash.    Physical Exam Updated Vital Signs BP (!) 152/111   Pulse (!) 111   Temp 99.2 F (37.3 C) (Oral)   Resp 18   Ht 5\' 7"  (1.702 m)   Wt 104.3 kg   LMP 10/12/2011   SpO2 100%   BMI 36.02 kg/m  Physical Exam Constitutional:      Appearance: Normal appearance. She is obese.  HENT:     Head: Normocephalic.  Eyes:     Extraocular Movements: Extraocular movements intact.     Pupils: Pupils are  equal, round, and reactive to light.  Cardiovascular:     Rate and Rhythm: Normal rate.  Abdominal:     General: Abdomen is flat.  Skin:    General: Skin is warm and dry.          Comments: +2x2cm fluctuance with associated erythema along L vulva w/o involvement of labia. EMT present as chaperone. No crepitus or other findings other than mild overlying erythema.   Neurological:     Mental Status: She is alert.      ED Results / Procedures / Treatments   Labs (all labs ordered are listed, but only abnormal results are displayed) Labs Reviewed - No data to display  EKG None  Radiology No results found.  Procedures .Incision and Drainage  Date/Time: 11/18/2023 7:17 PM  Performed by: Timmy Forbes, PA Authorized by: Timmy Forbes, PA   Consent:    Consent obtained:  Verbal   Consent given by:  Patient   Risks, benefits, and alternatives were discussed: yes     Risks discussed:  Bleeding, damage to other organs, infection, incomplete drainage and pain   Alternatives discussed:  No treatment Universal protocol:    Patient identity confirmed:  Verbally with patient Location:    Type:  Abscess   Size:  2x2cm   Location:  Anogenital   Anogenital location:  Vulva Pre-procedure details:    Skin preparation:  Povidone-iodine Sedation:    Sedation type:  None Anesthesia:    Anesthesia method:  Local infiltration   Local anesthetic:  Lidocaine  1% WITH epi Procedure type:    Complexity:  Simple Procedure details:    Incision types:  Stab incision   Wound management:  Probed and deloculated   Drainage:  Purulent   Drainage amount:  Copious   Wound treatment:  Wound left open   Packing materials:  None Post-procedure details:    Procedure completion:  Tolerated     Medications Ordered in ED Medications  oxyCODONE -acetaminophen  (PERCOCET/ROXICET) 5-325 MG per tablet 1 tablet (1 tablet Oral Given 11/18/23 1652)  lidocaine -EPINEPHrine  (XYLOCAINE  W/EPI) 2 %-1:200000 (PF) injection 10 mL (10 mLs Infiltration Given by Other 11/18/23 1910)  doxycycline  (VIBRA -TABS) tablet 100 mg (100 mg Oral Given 11/18/23 1922)  ketorolac  (TORADOL ) 15 MG/ML injection 15 mg (15 mg Intramuscular Given 11/18/23 1921)    ED Course/ Medical Decision Making/ A&P                                 Medical Decision Making Patient is a 58 year old female, here for left groin pain, it has been going on for the last few days.  Has progressively gotten larger, she states that she is on any immunosuppressants, does not do drugs, and is not diabetic.  On exam, she has a large abscess, with a few pustules around it, it is about 2 x 2 cm, and fluctuant, I utilized an ultrasound shows large amount of fluid, underneath, and lanced the area.  She is  afebrile, but slightly tachycardic, I believe this likely secondary to the pain.  She has no signs of any kind of systemic infection, she does not have any red flags at this time.  Lanced, with great success, of relief of pain, and started on doxycycline  with follow-up with primary care doctor.  Discussed return precautions and was discharged home.  No evidence of any crepitus, does have some slight overlying erythema, but no necrosis noted.  Risk Prescription drug management.   Final Clinical Impression(s) / ED Diagnoses Final diagnoses:  Abscess, vulva  Cellulitis of groin    Rx / DC Orders ED Discharge Orders          Ordered    doxycycline  (VIBRAMYCIN ) 100 MG capsule  2 times daily        11/18/23 1919              Madeleine Fenn, Dwaine Gip, [place] 11/18/23 Luetta Sago, MD 11/18/23 2318

## 2023-11-19 ENCOUNTER — Ambulatory Visit (INDEPENDENT_AMBULATORY_CARE_PROVIDER_SITE_OTHER): Admitting: Podiatrist

## 2023-11-19 ENCOUNTER — Encounter: Payer: Self-pay | Admitting: Podiatrist

## 2023-11-19 ENCOUNTER — Ambulatory Visit (INDEPENDENT_AMBULATORY_CARE_PROVIDER_SITE_OTHER)

## 2023-11-19 DIAGNOSIS — M7732 Calcaneal spur, left foot: Secondary | ICD-10-CM

## 2023-11-19 DIAGNOSIS — M7662 Achilles tendinitis, left leg: Secondary | ICD-10-CM | POA: Diagnosis not present

## 2023-11-19 DIAGNOSIS — M7661 Achilles tendinitis, right leg: Secondary | ICD-10-CM | POA: Diagnosis not present

## 2023-11-19 DIAGNOSIS — M7731 Calcaneal spur, right foot: Secondary | ICD-10-CM | POA: Diagnosis not present

## 2023-11-19 MED ORDER — NITROGLYCERIN 0.2 MG/HR TD PT24: 0.2000 mg | MEDICATED_PATCH | Freq: Every day | TRANSDERMAL | 12 refills | Status: AC

## 2023-11-19 NOTE — Patient Instructions (Addendum)
 Achilles Tendinitis Rehab Ask your health care provider which exercises are safe for you. Do exercises exactly as told by your provider and adjust them as directed. It is normal to feel mild stretching, pulling, tightness, or discomfort as you do these exercises. Stop right away if you feel sudden pain or your pain gets worse. Do not begin these exercises until told by your provider. Stretching and range-of-motion exercises These exercises warm up your muscles and joints. They improve the movement and flexibility of your ankle. These exercises also help to relieve pain. Standing wall calf stretch with straight knee  Stand with your hands against a wall. Extend your left / right leg behind you, and bend your front knee slightly. Keep both of your heels on the floor. Point the toes of your back foot slightly inward. Keeping your heels on the floor and your back knee straight, shift your weight toward the wall. Do not let your back arch. You should feel a gentle stretch in your upper calf. Hold this position for ___20_______ seconds. Repeat ____10______ times. Complete this exercise _____3_____ times a day. Standing wall calf stretch with bent knee  Stand with your hands against a wall. Extend your left / right leg behind you, and bend your front knee slightly. Keep both of your heels on the floor. Point the toes of your back foot slightly inward. Keeping your heels on the floor, bend your back knee slightly. You should feel a gentle stretch deep in your lower calf near your heel. Hold this position for ____20______ seconds. Repeat ____10______ times. Complete this exercise ____3______ times a day.

## 2023-11-19 NOTE — Progress Notes (Signed)
 Chief Complaint  Patient presents with   Foot Pain    Posterior  heel bilateral - aching x 6 months, swelling increases throughout the day, tried Tylenol -helps some   Nail Problem    Hallux left - thick, dark nail x few months, noticing the nail is splitting down the middle, not sore   New Patient (Initial Visit)     HPI: Patient is 58 y.o. female who presents today for concerns as listed above  Patient Active Problem List   Diagnosis Date Noted   Class 2 obesity with body mass index (BMI) of 36.0 to 36.9 in adult 11/24/2023   Cystitis 11/24/2023   Type 2 diabetes mellitus with hyperglycemia, without long-term current use of insulin (HCC) 11/24/2023   Dyslipidemia due to type 2 diabetes mellitus (HCC) 11/24/2023   Screening mammogram for breast cancer 11/24/2023   Pain of left heel 11/24/2023   Encounter for hepatitis C screening test for low risk patient 11/24/2023   Primary hypertension 11/24/2023   Herpes zoster vaccination declined 11/24/2023   Pneumococcal vaccination declined 11/24/2023   Encounter to establish care with new doctor 11/24/2023   Bipolar 1 disorder, depressed, mild (HCC) 10/22/2018   Insomnia 11/09/2015   PTSD (post-traumatic stress disorder) 11/09/2015   GAD (generalized anxiety disorder) 11/09/2015    Current Outpatient Medications on File Prior to Visit  Medication Sig Dispense Refill   metoprolol  succinate (TOPROL -XL) 50 MG 24 hr tablet Take 50 mg by mouth 2 (two) times daily.     ALPRAZolam  (XANAX ) 1 MG tablet Take 1 tablet (1 mg total) by mouth 3 (three) times daily. 90 tablet 4   atorvastatin  (LIPITOR) 10 MG tablet Take 1 tablet (10 mg total) by mouth daily. 30 tablet 11   buPROPion  (WELLBUTRIN  XL) 150 MG 24 hr tablet 3 qam 90 tablet 4   cholecalciferol  (VITAMIN D) 400 UNITS TABS tablet Take 1,200 Units by mouth 2 (two) times a week. Reported on 11/09/2015     diclofenac  sodium (VOLTAREN ) 1 % GEL Apply 2 g topically 4 (four) times daily. 100 g 0    doxycycline  (VIBRAMYCIN ) 100 MG capsule Take 1 capsule (100 mg total) by mouth 2 (two) times daily. 14 capsule 0   fenofibrate micronized (LOFIBRA) 134 MG capsule Take 134 mg by mouth daily.     fluticasone (FLONASE) 50 MCG/ACT nasal spray Place 1 spray into both nostrils daily.     hydrochlorothiazide  (HYDRODIURIL ) 25 MG tablet Take 25 mg by mouth daily. Reported on 11/09/2015     ibuprofen  (ADVIL ) 600 MG tablet Take 1 tablet (600 mg total) by mouth every 6 (six) hours as needed. 30 tablet 0   metoprolol  (LOPRESSOR ) 50 MG tablet Take 50 mg by mouth 2 (two) times daily.     Potassium Chloride  ER 20 MEQ TBCR Take 1 tablet by mouth daily.     QUEtiapine  (SEROQUEL ) 200 MG tablet 2 qhs 60 tablet 5   No current facility-administered medications on file prior to visit.    Allergies  Allergen Reactions   Aspirin Other (See Comments)    Upset stomach    Review of Systems No fevers, chills, nausea, muscle aches, no difficulty breathing, no calf pain, no chest pain or shortness of breath.   Physical Exam  GENERAL APPEARANCE: Alert, conversant. Appropriately groomed. No acute distress.   VASCULAR: Pedal pulses palpable 2/4 DP and 2/4 PT bilateral.  Capillary refill time is immediate to all digits,  Proximal to distal cooling is warm to warm.  Digital perfusion adequate.   NEUROLOGIC: sensation is intact to 5.07 monofilament at 5/5 sites bilateral.  Light touch is intact bilateral, vibratory sensation intact bilateral  MUSCULOSKELETAL: acceptable muscle strength, tone and stability bilateral.  Pain posterior aspect of bilateral heels is noted.  Notable bony exostosis is seen on the posterior aspect of both heels as well as pain in the achilles tendon noted with palpation   DERMATOLOGIC: skin is warm, supple, and dry.  Left hallux nail is darkened in color and does have a central split in the nail.  Does not appear to be mycotic-.  No pain, no drainage is noted.  X-ray evaluation 3 views  bilateral feet are obtained.  Significant posterior calcaneal spurring is seen bilateral.  Pes planus foot type is noted bilateral no acute osseous or joint abnormalities are seen.  Assessment     ICD-10-CM   1. Achilles tendinitis of both lower extremities  M76.61 DG Foot Complete Left   M76.62 DG Foot Complete Right    2. Heel spur, left  M77.32     3. Heel spur, right  M77.31        Plan  Discussed exam and x-ray findings with Bryahna.  Discussed she has significant posterior calcaneal spurring in the area of discomfort at lateral heels.  Discussed with him that shoes with a heel left.  Heel lifts were dispensed.  Also recommending trying nitroglycerin  patches to increase blood flow to the area and help with pain.  Prescription was written.  Also discussed the hallux nail-recommended trying superglue on the nail to see if she could manage the split so it can grow out distally.  She will be seen back in 4 to 6 weeks for follow-up and further evaluation.  But if problems or concerns arise prior to that visit she will call.

## 2023-11-24 ENCOUNTER — Encounter: Payer: Self-pay | Admitting: Family Medicine

## 2023-11-24 DIAGNOSIS — Z1231 Encounter for screening mammogram for malignant neoplasm of breast: Secondary | ICD-10-CM | POA: Insufficient documentation

## 2023-11-24 DIAGNOSIS — Z7689 Persons encountering health services in other specified circumstances: Secondary | ICD-10-CM | POA: Insufficient documentation

## 2023-11-24 DIAGNOSIS — E1165 Type 2 diabetes mellitus with hyperglycemia: Secondary | ICD-10-CM | POA: Insufficient documentation

## 2023-11-24 DIAGNOSIS — I10 Essential (primary) hypertension: Secondary | ICD-10-CM | POA: Insufficient documentation

## 2023-11-24 DIAGNOSIS — Z1159 Encounter for screening for other viral diseases: Secondary | ICD-10-CM | POA: Insufficient documentation

## 2023-11-24 DIAGNOSIS — M79672 Pain in left foot: Secondary | ICD-10-CM | POA: Insufficient documentation

## 2023-11-24 DIAGNOSIS — E785 Hyperlipidemia, unspecified: Secondary | ICD-10-CM | POA: Insufficient documentation

## 2023-11-24 DIAGNOSIS — Z6836 Body mass index (BMI) 36.0-36.9, adult: Secondary | ICD-10-CM | POA: Insufficient documentation

## 2023-11-24 DIAGNOSIS — Z2821 Immunization not carried out because of patient refusal: Secondary | ICD-10-CM | POA: Insufficient documentation

## 2023-11-24 DIAGNOSIS — N3090 Cystitis, unspecified without hematuria: Secondary | ICD-10-CM | POA: Insufficient documentation

## 2023-11-24 NOTE — Assessment & Plan Note (Signed)
Followed by Dr Casimiro Needle

## 2023-11-24 NOTE — Assessment & Plan Note (Signed)
 She is encouraged to strive for BMI less than 30 to decrease cardiac risk. Advised to aim for at least 150 minutes of exercise per week.

## 2023-11-24 NOTE — Assessment & Plan Note (Signed)
 Lipid Panel     Component Value Date/Time   CHOL 132 11/13/2023 1539   TRIG 93 11/13/2023 1539   HDL 56 11/13/2023 1539   CHOLHDL 2.4 11/13/2023 1539   LDLCALC 58 11/13/2023 1539   LABVLDL 18 11/13/2023 1539   Continue Lipitor 10 mg every day

## 2023-11-24 NOTE — Assessment & Plan Note (Signed)
-  Continue hydrochlorothiazide 25 mg every day

## 2023-12-18 ENCOUNTER — Ambulatory Visit: Admitting: Podiatry

## 2023-12-19 ENCOUNTER — Encounter (HOSPITAL_COMMUNITY): Payer: Self-pay | Admitting: Psychiatry

## 2023-12-19 ENCOUNTER — Ambulatory Visit (HOSPITAL_BASED_OUTPATIENT_CLINIC_OR_DEPARTMENT_OTHER): Admitting: Psychiatry

## 2023-12-19 ENCOUNTER — Other Ambulatory Visit: Payer: Self-pay

## 2023-12-19 VITALS — BP 137/93 | HR 83 | Ht 67.0 in | Wt 227.0 lb

## 2023-12-19 DIAGNOSIS — F3110 Bipolar disorder, current episode manic without psychotic features, unspecified: Secondary | ICD-10-CM | POA: Diagnosis not present

## 2023-12-19 MED ORDER — QUETIAPINE FUMARATE 200 MG PO TABS: ORAL_TABLET | ORAL | 5 refills | Status: DC

## 2023-12-19 MED ORDER — ALPRAZOLAM 1 MG PO TABS: 1.0000 mg | ORAL_TABLET | Freq: Three times a day (TID) | ORAL | 4 refills | Status: DC

## 2023-12-19 MED ORDER — DIVALPROEX SODIUM ER 250 MG PO TB24: ORAL_TABLET | ORAL | 4 refills | Status: DC

## 2023-12-19 NOTE — Progress Notes (Signed)
 [facility] MD/PA/NP OP Progress Note  11/14/2020 11:48 AM Jill [name]  MRN:  [id number]     Today the patient is not doing well.  She feels a lot more irritable.  She says that she is not getting along with her kids.  She never got to go on the cruise.  The patient says her irritability is more than usual.  It started to occur even at work.  She has not gotten in trouble yet at work but they are fears that she might.  She asked him he could go back on her Seroquel .  The patient is eating okay interestingly she continues to lose weight.  She went from 235 now down to 227.  She is moderately overweight so this is not a bad thing.  The patient spends most of her time alone.  The patient does have an appetite.  She denies being persistently depressed.  She drinks no alcohol and uses no drugs. Visit Diagnosis:  No diagnosis found.  Past Psychiatric History: Please see intake H&P.  Past Medical History:  Past Medical History:  Diagnosis Date   Anxiety    Bipolar 1 disorder (HCC)    Hypertension    Post-traumatic stress syndrome     Past Surgical History:  Procedure Laterality Date   BLADDER SURGERY     CHOLECYSTECTOMY     TUBAL LIGATION      Family Psychiatric History: Reviewed.  Family History:  Family History  Problem Relation Age of Onset   Drug abuse Mother    Alcohol abuse Maternal Uncle    Drug abuse Maternal Uncle     Social History:  Social History   Socioeconomic History   Marital status: Single    Spouse name: Not on file   Number of children: 2   Years of education: 14   Highest education level: Not on file  Occupational History   Not on file  Tobacco Use   Smoking status: Every Day    Current packs/day: 0.00    Types: Cigarettes    Last attempt to quit: 08/29/2016    Years since quitting: 7.3   Smokeless tobacco: Never  Vaping Use   Vaping status: Never Used  Substance and Sexual Activity   Alcohol use: No   Drug use: Not Currently    Types: Marijuana    Sexual activity: Never    Birth control/protection: Surgical  Other Topics Concern   Not on file  Social History Narrative   Pt lives with her son in [place]. Pt works at [employer]. Born and raised in [place] by mom until 7 then by grandparents. Pt has one older brother. Pt is at [employer] and studying medical billing and coding. Never married and has 2 kids.    Social Drivers of Corporate investment banker Strain: Not on file  Food Insecurity: Not on file  Transportation Needs: Not on file  Physical Activity: Not on file  Stress: Not on file  Social Connections: Not on file    Allergies:  Allergies  Allergen Reactions   Aspirin Other (See Comments)    Upset stomach    Metabolic Disorder Labs: Lab Results  Component Value Date   HGBA1C 6.3 (H) 11/13/2023   No results found for: "PROLACTIN" Lab Results  Component Value Date   CHOL 132 11/13/2023   TRIG 93 11/13/2023   HDL 56 11/13/2023   CHOLHDL 2.4 11/13/2023   LDLCALC 58 11/13/2023   LDLCALC 54 09/12/2020  Lab Results  Component Value Date   TSH 0.643 01/14/2020    Therapeutic Level Labs: No results found for: "LITHIUM" No results found for: "VALPROATE" No results found for: "CBMZ"  Current Medications: Current Outpatient Medications  Medication Sig Dispense Refill   buPROPion  (WELLBUTRIN  XL) 150 MG 24 hr tablet 3 qam 90 tablet 4   cholecalciferol  (VITAMIN D) 400 UNITS TABS tablet Take 1,200 Units by mouth 2 (two) times a week. Reported on 11/09/2015     diclofenac  sodium (VOLTAREN ) 1 % GEL Apply 2 g topically 4 (four) times daily. 100 g 0   divalproex (DEPAKOTE ER) 250 MG 24 hr tablet 1 at bedtime for 3 days then 2  qhs 60 tablet 4   doxycycline  (VIBRAMYCIN ) 100 MG capsule Take 1 capsule (100 mg total) by mouth 2 (two) times daily. 14 capsule 0   fenofibrate micronized (LOFIBRA) 134 MG capsule Take 134 mg by mouth daily.     fluticasone (FLONASE) 50 MCG/ACT nasal spray Place 1 spray into both nostrils  daily.     hydrochlorothiazide  (HYDRODIURIL ) 25 MG tablet Take 25 mg by mouth daily. Reported on 11/09/2015     ibuprofen  (ADVIL ) 600 MG tablet Take 1 tablet (600 mg total) by mouth every 6 (six) hours as needed. 30 tablet 0   metoprolol  (LOPRESSOR ) 50 MG tablet Take 50 mg by mouth 2 (two) times daily.     metoprolol  succinate (TOPROL -XL) 50 MG 24 hr tablet Take 50 mg by mouth 2 (two) times daily.     nitroGLYCERIN  (NITRO-DUR ) 0.2 mg/hr patch Place 1 patch (0.2 mg total) onto the skin daily. 30 patch 12   Potassium Chloride  ER 20 MEQ TBCR Take 1 tablet by mouth daily.     ALPRAZolam  (XANAX ) 1 MG tablet Take 1 tablet (1 mg total) by mouth 3 (three) times daily. 90 tablet 4   atorvastatin  (LIPITOR) 10 MG tablet Take 1 tablet (10 mg total) by mouth daily. 30 tablet 11   QUEtiapine  (SEROQUEL ) 200 MG tablet 3 qhs 90 tablet 5   No current facility-administered medications for this visit.      Psychiatric Specialty Exam: Review of Systems  Psychiatric/Behavioral:  The patient is nervous/anxious.   All other systems reviewed and are negative.   Blood pressure (!) 137/93, pulse 83, height 5\' 7"  (1.702 m), weight 227 lb (103 kg), last menstrual period 10/12/2011.Body mass index is 35.55 kg/m.  General Appearance: NA  Eye Contact:  NA  Speech:  Clear and Coherent and Normal Rate  Volume:  Normal  Mood:  Anxious  Affect:  NA  Thought Process:  Goal Directed and Linear  Orientation:  Full (Time, Place, and Person)  Thought Content: Logical   Suicidal Thoughts:  No  Homicidal Thoughts:  No  Memory:  Immediate;   Good Recent;   Good Remote;   Good  Judgement:  Good  Insight:  Good  Psychomotor Activity:  NA  Concentration:  Concentration: Good  Recall:  Good  Fund of Knowledge: Good  Language: Good  Akathisia:  Negative  Handed:  Right  AIMS (if indicated): not done  Assets:  Communication Skills Desire for Improvement Financial  Resources/Insurance Housing Resilience Talents/Skills  ADL's:  Intact  Cognition: WNL  Sleep:  Good   Screenings: AIMS    Flowsheet Row [facility] 10[facility]22/[facility]  AIMS Total Score 0      GAD-7    Flowsheet Row Office Visit from 11/13/2023 in [facility] Counselor from 02/12/2021 in [facility] at [facility]  Total GAD-7 Score 0 17      PHQ2-9    Flowsheet Row Office Visit from 11/13/2023 in [facility] Counselor from 02/12/2021 in [facility] at [facility] Total Score 0 2  PHQ-9 Total Score 0 10      Flowsheet Row ED from 11/18/2023 in [facility] Emergency Department at [facility] ED from 10/18/2023 in [facility] Department at [facility] ED from 06/07/2022 in [facility] Department at [facility]  C-SSRS RISK CATEGORY No Risk No Risk No Risk        Assessment and Plan:     At this time the patient's diagnosis is bipolar disorder.  Her irritability has worsened.  Today we will go to increase her Seroquel  back to 600 mg.  We will also go to begin her on Depakote 250 mg 2 at night.  She will continue taking Wellbutrin  and continue Xanax  1 mg 3 times daily.  The patient return to see me in 7 weeks.  At that time we will do a more thorough diagnostic evaluation.  Patient also acknowledges that the news is getting on her nerves.  She says she has been watching a lot more news and today we recommended that she cut back on watching the news.  She completely understands this intervention. 11/14/2020, 11:48 AM

## 2023-12-26 ENCOUNTER — Telehealth (HOSPITAL_COMMUNITY): Payer: Self-pay

## 2023-12-26 NOTE — Telephone Encounter (Signed)
 Patient called stating that she saw Dr Levie Ream on 12-19-23 and he increased her Seroquel  from 400 mg to 600 mg she is having issues at the pharmacy due to the new prescription not being sent in please advise

## 2023-12-29 ENCOUNTER — Other Ambulatory Visit (HOSPITAL_COMMUNITY): Payer: Self-pay | Admitting: *Deleted

## 2023-12-29 MED ORDER — QUETIAPINE FUMARATE 200 MG PO TABS: ORAL_TABLET | ORAL | 4 refills | Status: DC

## 2023-12-30 ENCOUNTER — Other Ambulatory Visit (HOSPITAL_COMMUNITY): Payer: Self-pay | Admitting: Psychiatry

## 2023-12-30 MED ORDER — QUETIAPINE FUMARATE 200 MG PO TABS: ORAL_TABLET | ORAL | 4 refills | Status: DC

## 2024-01-01 ENCOUNTER — Ambulatory Visit: Admitting: Podiatry

## 2024-01-04 ENCOUNTER — Emergency Department (HOSPITAL_COMMUNITY)

## 2024-01-04 ENCOUNTER — Observation Stay (HOSPITAL_COMMUNITY)
Admission: EM | Admit: 2024-01-04 | Discharge: 2024-01-07 | Disposition: A | Discharge status: Home / Self Care | Attending: Internal Medicine | Admitting: Internal Medicine

## 2024-01-04 DIAGNOSIS — I10 Essential (primary) hypertension: Secondary | ICD-10-CM | POA: Diagnosis not present

## 2024-01-04 DIAGNOSIS — N3000 Acute cystitis without hematuria: Secondary | ICD-10-CM | POA: Diagnosis not present

## 2024-01-04 DIAGNOSIS — Z79899 Other long term (current) drug therapy: Secondary | ICD-10-CM | POA: Insufficient documentation

## 2024-01-04 DIAGNOSIS — Z6834 Body mass index (BMI) 34.0-34.9, adult: Secondary | ICD-10-CM | POA: Insufficient documentation

## 2024-01-04 DIAGNOSIS — F1910 Other psychoactive substance abuse, uncomplicated: Secondary | ICD-10-CM | POA: Diagnosis not present

## 2024-01-04 DIAGNOSIS — E876 Hypokalemia: Secondary | ICD-10-CM | POA: Insufficient documentation

## 2024-01-04 DIAGNOSIS — E66811 Obesity, class 1: Secondary | ICD-10-CM | POA: Diagnosis not present

## 2024-01-04 DIAGNOSIS — F1990 Other psychoactive substance use, unspecified, uncomplicated: Secondary | ICD-10-CM | POA: Diagnosis not present

## 2024-01-04 DIAGNOSIS — N390 Urinary tract infection, site not specified: Secondary | ICD-10-CM | POA: Insufficient documentation

## 2024-01-04 DIAGNOSIS — R410 Disorientation, unspecified: Principal | ICD-10-CM

## 2024-01-04 DIAGNOSIS — E785 Hyperlipidemia, unspecified: Secondary | ICD-10-CM | POA: Insufficient documentation

## 2024-01-04 DIAGNOSIS — I2510 Atherosclerotic heart disease of native coronary artery without angina pectoris: Secondary | ICD-10-CM | POA: Insufficient documentation

## 2024-01-04 DIAGNOSIS — F17210 Nicotine dependence, cigarettes, uncomplicated: Secondary | ICD-10-CM | POA: Diagnosis not present

## 2024-01-04 DIAGNOSIS — R413 Other amnesia: Secondary | ICD-10-CM | POA: Diagnosis not present

## 2024-01-04 DIAGNOSIS — F3131 Bipolar disorder, current episode depressed, mild: Secondary | ICD-10-CM | POA: Diagnosis not present

## 2024-01-04 DIAGNOSIS — E119 Type 2 diabetes mellitus without complications: Secondary | ICD-10-CM | POA: Insufficient documentation

## 2024-01-04 LAB — COMPREHENSIVE METABOLIC PANEL WITH GFR
ALT: 20 U/L (ref 0–44)
AST: 20 U/L (ref 15–41)
Albumin: 4.2 g/dL (ref 3.5–5.0)
Alkaline Phosphatase: 76 U/L (ref 38–126)
Anion gap: 12 (ref 5–15)
BUN: 8 mg/dL (ref 6–20)
CO2: 22 mmol/L (ref 22–32)
Calcium: 9.4 mg/dL (ref 8.9–10.3)
Chloride: 106 mmol/L (ref 98–111)
Creatinine, Ser: 0.7 mg/dL (ref 0.44–1.00)
GFR, Estimated: >60 mL/min (ref >60)
Glucose, Bld: 122 mg/dL — ABNORMAL HIGH (ref 70–99)
Potassium: 3.1 mmol/L — ABNORMAL LOW (ref 3.5–5.1)
Sodium: 140 mmol/L (ref 135–145)
Total Bilirubin: 0.9 mg/dL (ref 0.0–1.2)
Total Protein: 7.3 g/dL (ref 6.5–8.1)

## 2024-01-04 LAB — URINALYSIS, W/ REFLEX TO CULTURE (INFECTION SUSPECTED)
Bilirubin Urine: NEGATIVE
Bilirubin Urine: NEGATIVE
Glucose, UA: NEGATIVE mg/dL
Glucose, UA: NEGATIVE mg/dL
Ketones, ur: 5 mg/dL — AB
Ketones, ur: 5 mg/dL — AB
Nitrite: POSITIVE — AB
Nitrite: POSITIVE — AB
Protein, ur: NEGATIVE mg/dL
Protein, ur: NEGATIVE mg/dL
Specific Gravity, Urine: 1.021 (ref 1.005–1.030)
Specific Gravity, Urine: 1.023 (ref 1.005–1.030)
pH: 6 (ref 5.0–8.0)
pH: 5 (ref 5.0–8.0)

## 2024-01-04 LAB — CBC WITH DIFFERENTIAL/PLATELET
Abs Immature Granulocytes: 0.02 10*3/uL (ref 0.00–0.07)
Basophils Absolute: 0 10*3/uL (ref 0.0–0.1)
Basophils Relative: 1 %
Eosinophils Absolute: 0 10*3/uL (ref 0.0–0.5)
Eosinophils Relative: 0 %
HCT: 41.2 % (ref 36.0–46.0)
Hemoglobin: 13.7 g/dL (ref 12.0–15.0)
Immature Granulocytes: 0 %
Lymphocytes Relative: 28 %
Lymphs Abs: 2.4 10*3/uL (ref 0.7–4.0)
MCH: 30.8 pg (ref 26.0–34.0)
MCHC: 33.3 g/dL (ref 30.0–36.0)
MCV: 92.6 fL (ref 80.0–100.0)
Monocytes Absolute: 0.6 10*3/uL (ref 0.1–1.0)
Monocytes Relative: 7 %
Neutro Abs: 5.5 10*3/uL (ref 1.7–7.7)
Neutrophils Relative %: 64 %
Platelets: 317 10*3/uL (ref 150–400)
RBC: 4.45 MIL/uL (ref 3.87–5.11)
RDW: 13.5 % (ref 11.5–15.5)
WBC: 8.5 10*3/uL (ref 4.0–10.5)
nRBC: 0 % (ref 0.0–0.2)

## 2024-01-04 LAB — RAPID URINE DRUG SCREEN, HOSP PERFORMED
Amphetamines: NOT DETECTED
Barbiturates: NOT DETECTED
Benzodiazepines: POSITIVE — AB
Cocaine: NOT DETECTED
Opiates: NOT DETECTED
Tetrahydrocannabinol: POSITIVE — AB

## 2024-01-04 LAB — TSH: TSH: 0.784 u[IU]/mL (ref 0.350–4.500)

## 2024-01-04 LAB — SALICYLATE LEVEL: Salicylate Lvl: <7 mg/dL — ABNORMAL LOW (ref 7.0–30.0)

## 2024-01-04 LAB — ETHANOL: Alcohol, Ethyl (B): <15 mg/dL (ref <15)

## 2024-01-04 LAB — AMMONIA: Ammonia: 26 umol/L (ref 9–35)

## 2024-01-04 LAB — ACETAMINOPHEN LEVEL: Acetaminophen (Tylenol), Serum: <10 ug/mL — ABNORMAL LOW (ref 10–30)

## 2024-01-04 MED ORDER — POTASSIUM CHLORIDE CRYS ER 20 MEQ PO TBCR
40.0000 meq | EXTENDED_RELEASE_TABLET | Freq: Once | ORAL | Status: AC
  Administered 2024-01-04: 40 meq via ORAL
  Filled 2024-01-04: qty 2

## 2024-01-04 MED ORDER — ACETAMINOPHEN 500 MG PO TABS: 1000.0000 mg | ORAL_TABLET | Freq: Four times a day (QID) | ORAL | Status: DC | PRN

## 2024-01-04 MED ORDER — ENOXAPARIN SODIUM 40 MG/0.4ML IJ SOSY
40.0000 mg | PREFILLED_SYRINGE | INTRAMUSCULAR | Status: DC
  Administered 2024-01-04 – 2024-01-06 (×3): 40 mg via SUBCUTANEOUS
  Filled 2024-01-04 (×3): qty 0.4

## 2024-01-04 MED ORDER — FENOFIBRATE 160 MG PO TABS
160.0000 mg | ORAL_TABLET | Freq: Every day | ORAL | Status: DC
  Administered 2024-01-05 – 2024-01-07 (×3): 160 mg via ORAL
  Filled 2024-01-04 (×3): qty 1

## 2024-01-04 MED ORDER — SODIUM CHLORIDE 0.9 % IV SOLN
1.0000 g | INTRAVENOUS | Status: DC
  Administered 2024-01-05 – 2024-01-06 (×2): 1 g via INTRAVENOUS
  Filled 2024-01-04 (×2): qty 10

## 2024-01-04 MED ORDER — SODIUM CHLORIDE 0.9 % IV SOLN
1.0000 g | Freq: Once | INTRAVENOUS | Status: DC
  Filled 2024-01-04: qty 10

## 2024-01-04 MED ORDER — MELATONIN 3 MG PO TABS
6.0000 mg | ORAL_TABLET | Freq: Every evening | ORAL | Status: DC | PRN
  Filled 2024-01-04: qty 2

## 2024-01-04 MED ORDER — BUPROPION HCL ER (XL) 300 MG PO TB24
450.0000 mg | ORAL_TABLET | Freq: Every day | ORAL | Status: DC
  Administered 2024-01-05 – 2024-01-07 (×3): 450 mg via ORAL
  Filled 2024-01-04 (×3): qty 1

## 2024-01-04 MED ORDER — DIVALPROEX SODIUM ER 500 MG PO TB24
500.0000 mg | ORAL_TABLET | Freq: Every day | ORAL | Status: DC
  Administered 2024-01-04 – 2024-01-06 (×3): 500 mg via ORAL
  Filled 2024-01-04 (×4): qty 1

## 2024-01-04 MED ORDER — POLYETHYLENE GLYCOL 3350 17 G PO PACK: 17.0000 g | PACK | Freq: Every day | ORAL | Status: DC | PRN

## 2024-01-04 MED ORDER — ALPRAZOLAM 0.5 MG PO TABS
1.0000 mg | ORAL_TABLET | Freq: Three times a day (TID) | ORAL | Status: DC
  Administered 2024-01-04 – 2024-01-07 (×9): 1 mg via ORAL
  Filled 2024-01-04 (×9): qty 2

## 2024-01-04 MED ORDER — QUETIAPINE FUMARATE 100 MG PO TABS
400.0000 mg | ORAL_TABLET | Freq: Every day | ORAL | Status: DC
  Administered 2024-01-04: 400 mg via ORAL
  Filled 2024-01-04: qty 4

## 2024-01-04 MED ORDER — POTASSIUM CHLORIDE 10 MEQ/100ML IV SOLN
10.0000 meq | Freq: Once | INTRAVENOUS | Status: AC
  Administered 2024-01-04: 10 meq via INTRAVENOUS
  Filled 2024-01-04: qty 100

## 2024-01-04 MED ORDER — FENOFIBRATE 134 MG PO CAPS: 134.0000 mg | ORAL_CAPSULE | Freq: Every day | ORAL | Status: DC

## 2024-01-04 MED ORDER — HYDROCHLOROTHIAZIDE 25 MG PO TABS: 25.0000 mg | ORAL_TABLET | Freq: Every day | ORAL | Status: DC

## 2024-01-04 MED ORDER — METOPROLOL SUCCINATE ER 50 MG PO TB24
50.0000 mg | ORAL_TABLET | Freq: Every day | ORAL | Status: DC
  Administered 2024-01-05 – 2024-01-07 (×3): 50 mg via ORAL
  Filled 2024-01-04 (×3): qty 1

## 2024-01-04 MED ORDER — ALBUTEROL SULFATE (2.5 MG/3ML) 0.083% IN NEBU: 2.5000 mg | INHALATION_SOLUTION | RESPIRATORY_TRACT | Status: DC | PRN
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
## 2024-01-04 NOTE — ED Triage Notes (Signed)
 Patient BIB EMS for AMS since 5 pm yesterday per family. Having issues with memory- can't remember year, month, medical history... Family states patient was A/Ox4 until yesterday and this is complete change for patient. Patient unsure if she took her meds today. Patient attempted to get in car and drive today, and was stopped by son due to confusion. JRP,RN  VS per EMS  P104 169/104 CBG 162 99%RA  18G L AC

## 2024-01-04 NOTE — ED Notes (Signed)
 Patient transported to MRI
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
## 2024-01-04 NOTE — ED Notes (Signed)
 MD at bedside.

## 2024-01-04 NOTE — ED Triage Notes (Signed)
 Patient son at bedside. Patient tearful during triage, is able to tell me where she is but not what the year is. States she is depressed, has a history of depression. Unsure if she has taken her meds today or not. Patient denies any pain. Patients son said she was fine until yesterday and had a breakdown, and was shaking, yelling and had some family issues going on at the present time. JRPRN

## 2024-01-04 NOTE — ED Notes (Signed)
 Pt. Attempted urine specimen but threw it away in the toilet. RN made aware.

## 2024-01-05 ENCOUNTER — Encounter (HOSPITAL_COMMUNITY): Payer: Self-pay | Admitting: Internal Medicine

## 2024-01-05 ENCOUNTER — Other Ambulatory Visit: Payer: Self-pay

## 2024-01-05 ENCOUNTER — Other Ambulatory Visit (HOSPITAL_COMMUNITY): Payer: Self-pay

## 2024-01-05 ENCOUNTER — Other Ambulatory Visit (HOSPITAL_COMMUNITY): Payer: Self-pay | Admitting: Psychiatry

## 2024-01-05 DIAGNOSIS — R413 Other amnesia: Secondary | ICD-10-CM | POA: Diagnosis not present

## 2024-01-05 DIAGNOSIS — N3000 Acute cystitis without hematuria: Secondary | ICD-10-CM | POA: Diagnosis not present

## 2024-01-05 DIAGNOSIS — F3131 Bipolar disorder, current episode depressed, mild: Secondary | ICD-10-CM | POA: Diagnosis not present

## 2024-01-05 LAB — LIPID PANEL
Cholesterol: 142 mg/dL (ref 0–200)
HDL: 52 mg/dL (ref >40)
LDL Cholesterol: 70 mg/dL (ref 0–99)
Total CHOL/HDL Ratio: 2.7 ratio
Triglycerides: 102 mg/dL (ref <150)
VLDL: 20 mg/dL (ref 0–40)

## 2024-01-05 LAB — VITAMIN B12: Vitamin B-12: 639 pg/mL (ref 180–914)

## 2024-01-05 LAB — PHOSPHORUS: Phosphorus: 3.2 mg/dL (ref 2.5–4.6)

## 2024-01-05 LAB — BASIC METABOLIC PANEL WITH GFR
Anion gap: 10 (ref 5–15)
BUN: 9 mg/dL (ref 6–20)
CO2: 21 mmol/L — ABNORMAL LOW (ref 22–32)
Calcium: 9.2 mg/dL (ref 8.9–10.3)
Chloride: 109 mmol/L (ref 98–111)
Creatinine, Ser: 0.86 mg/dL (ref 0.44–1.00)
GFR, Estimated: >60 mL/min (ref >60)
Glucose, Bld: 108 mg/dL — ABNORMAL HIGH (ref 70–99)
Potassium: 3 mmol/L — ABNORMAL LOW (ref 3.5–5.1)
Sodium: 140 mmol/L (ref 135–145)

## 2024-01-05 LAB — MAGNESIUM: Magnesium: 1.8 mg/dL (ref 1.7–2.4)

## 2024-01-05 LAB — LACTIC ACID, PLASMA: Lactic Acid, Venous: 0.9 mmol/L (ref 0.5–1.9)

## 2024-01-05 LAB — HIV ANTIBODY (ROUTINE TESTING W REFLEX): HIV Screen 4th Generation wRfx: NONREACTIVE

## 2024-01-05 MED ORDER — SODIUM CHLORIDE 0.9 % IV BOLUS
1000.0000 mL | Freq: Once | INTRAVENOUS | Status: AC
  Administered 2024-01-05: 1000 mL via INTRAVENOUS

## 2024-01-05 MED ORDER — POTASSIUM CHLORIDE 10 MEQ/100ML IV SOLN
10.0000 meq | INTRAVENOUS | Status: AC
  Administered 2024-01-05 (×3): 10 meq via INTRAVENOUS
  Filled 2024-01-05 (×3): qty 100

## 2024-01-05 MED ORDER — POTASSIUM CHLORIDE CRYS ER 20 MEQ PO TBCR
40.0000 meq | EXTENDED_RELEASE_TABLET | Freq: Once | ORAL | Status: AC
  Administered 2024-01-05: 40 meq via ORAL
  Filled 2024-01-05: qty 2

## 2024-01-05 MED ORDER — SODIUM CHLORIDE 0.9 % IV SOLN
1.0000 g | Freq: Once | INTRAVENOUS | Status: AC
  Administered 2024-01-05: 1 g via INTRAVENOUS

## 2024-01-05 MED ORDER — QUETIAPINE FUMARATE ER 300 MG PO TB24: 600.0000 mg | ORAL_TABLET | Freq: Every day | ORAL | 2 refills | Status: DC
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
## 2024-01-05 NOTE — Telephone Encounter (Signed)
 Patients Seroquel  was not covered for 200 mg 3 po at bedtime. I changed the rx to 300 mg 2 po at bedtime and it is now covered. I called the pharmacy and the patient to let them know

## 2024-01-05 NOTE — Plan of Care (Signed)

## 2024-01-05 NOTE — Progress Notes (Signed)
 Orders received for Telemetry. The patient needs to be transferred to another unit. Waiting for Unit readiness to transfer.

## 2024-01-05 NOTE — Progress Notes (Signed)
   01/05/24 0933  TOC Brief Assessment  Insurance and Status Reviewed  Patient has primary care physician Yes  Home environment has been reviewed apartment  Prior level of function: independent  Prior/Current Home Services No current home services  Social Drivers of Health Review SDOH reviewed no interventions necessary  Readmission risk has been reviewed Yes  Transition of care needs transition of care needs identified, TOC will continue to follow    [place], MSW, LCSW 01/05/2024 9:33 AM

## 2024-01-06 DIAGNOSIS — N3000 Acute cystitis without hematuria: Secondary | ICD-10-CM | POA: Diagnosis not present

## 2024-01-06 DIAGNOSIS — F3131 Bipolar disorder, current episode depressed, mild: Secondary | ICD-10-CM | POA: Diagnosis not present

## 2024-01-06 DIAGNOSIS — R413 Other amnesia: Secondary | ICD-10-CM | POA: Diagnosis not present

## 2024-01-06 LAB — HEMOGLOBIN A1C
Hgb A1c MFr Bld: 6.2 % — ABNORMAL HIGH (ref 4.8–5.6)
Mean Plasma Glucose: 131 mg/dL

## 2024-01-06 MED ORDER — POTASSIUM CHLORIDE 10 MEQ/100ML IV SOLN
10.0000 meq | INTRAVENOUS | Status: AC
  Administered 2024-01-06 (×3): 10 meq via INTRAVENOUS
  Filled 2024-01-06 (×3): qty 100

## 2024-01-06 NOTE — Plan of Care (Signed)
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
## 2024-01-06 NOTE — Progress Notes (Addendum)
 PT Cancellation Note  Patient Details Name: Jill [name] MRN: [id number] DOB: February 26, 1966   Cancelled Treatment:    Reason Eval/Treat Not Completed: Other (comment). Pt reported she was IND and declined therapy services. Nursing staff in agreement with pt report of IND mobility. No further PT warranted at this time. Thank you for the referral.   Cary Clarks, PT Acute Rehab   Annalee Kiang 01/06/2024, 2:54 PM

## 2024-01-07 DIAGNOSIS — R413 Other amnesia: Secondary | ICD-10-CM | POA: Diagnosis not present

## 2024-01-07 LAB — MAGNESIUM: Magnesium: 1.9 mg/dL (ref 1.7–2.4)

## 2024-01-07 LAB — CBC
HCT: 42 % (ref 36.0–46.0)
Hemoglobin: 13.9 g/dL (ref 12.0–15.0)
MCH: 31 pg (ref 26.0–34.0)
MCHC: 33.1 g/dL (ref 30.0–36.0)
MCV: 93.8 fL (ref 80.0–100.0)
Platelets: 317 10*3/uL (ref 150–400)
RBC: 4.48 MIL/uL (ref 3.87–5.11)
RDW: 13.6 % (ref 11.5–15.5)
WBC: 7.1 10*3/uL (ref 4.0–10.5)
nRBC: 0 % (ref 0.0–0.2)

## 2024-01-07 LAB — BASIC METABOLIC PANEL WITH GFR
Anion gap: 9 (ref 5–15)
BUN: 11 mg/dL (ref 6–20)
CO2: 24 mmol/L (ref 22–32)
Calcium: 9.7 mg/dL (ref 8.9–10.3)
Chloride: 103 mmol/L (ref 98–111)
Creatinine, Ser: 1.01 mg/dL — ABNORMAL HIGH (ref 0.44–1.00)
GFR, Estimated: >60 mL/min (ref >60)
Glucose, Bld: 92 mg/dL (ref 70–99)
Potassium: 3 mmol/L — ABNORMAL LOW (ref 3.5–5.1)
Sodium: 136 mmol/L (ref 135–145)

## 2024-01-07 MED ORDER — POTASSIUM CHLORIDE CRYS ER 20 MEQ PO TBCR
40.0000 meq | EXTENDED_RELEASE_TABLET | Freq: Once | ORAL | Status: AC
  Administered 2024-01-07: 40 meq via ORAL
  Filled 2024-01-07: qty 2

## 2024-01-07 MED ORDER — POTASSIUM CHLORIDE 10 MEQ/100ML IV SOLN
10.0000 meq | INTRAVENOUS | Status: AC
  Administered 2024-01-07 (×3): 10 meq via INTRAVENOUS
  Filled 2024-01-07 (×3): qty 100

## 2024-01-07 NOTE — Plan of Care (Signed)

## 2024-01-07 NOTE — Discharge Summary (Signed)
 Physician Discharge Summary  Jill [name] UJW:[id number] DOB: Jun 27, 1966 DOA: 01/04/2024  PCP: Melodie Spry, NP  Admit date: 01/04/2024 Discharge date: 01/07/2024  Admitted From: Home  Discharge disposition: Home   Recommendations for Outpatient Follow-Up:   Follow up with your primary care provider in one week.  Check CBC, BMP, magnesium in the next visit Would benefit from neurology referral if patient has recurring amnesia.   Discharge Diagnosis:   Principal Problem:   Other amnesia Active Problems:   Bipolar 1 disorder, depressed, mild (HCC)   UTI (urinary tract infection)   Substance use  Discharge Condition: Improved.  Diet recommendation:   Regular.  Wound care: None.  Code status: Full.   History of Present Illness:   58 y.o. female with past medical history of hypertension, hyperlipidemia, diabetes mellitus, obesity, bipolar 1 disorder, was brought into the hospital secondary to amnesia.  Has had history of migraine headaches..  Was on Depakote  and Seroquel  as outpatient. At baseline patient is highly functional, performing all her ADLs, driving but on the morning of presentation patient was confused regarding events in the last 24 hours so was brought into the hospital.    Hospital Course:   Following conditions were addressed during hospitalization as listed below,  Transient amnesia, disorientation  -Resolved, CT head negative, MRI negative for acute stroke.   However patient was recently added on Depakote .  Takes Seroquel  at home.  HIV nonreactive.  Vitamin B12 within normal limits   Abnormal urinalysis. UTI ruled out.  Urinalysis abnormal but patient did not have symptoms.  Was initially on IV Rocephin but will discontinue at this time.  Substance use, positive THC Patient denied but UDS was positive.  Previous provider had spoken with the patient about quitting.   Prolonged Qtc  On presentation.  History of CAD -continue supportive care.   Latest A1c of 6.2.  No active issues  Old cerebellar infarct -incidental.  Likely to benefit from statin, aspirin not started yet.  Patient unaware of this diagnosis.  Hypokalemia.  Patient was replenished with potassium prior to discharge.  Recommend checking electrolytes with PCP.  Will be continued on potassium on discharge.  Hypertension:  On HCTZ at home.  Continue metoprolol    Hyperlipidemia:  Continue home fenofibrate   Diabetes melitis type 2: Last A1c 6.3.  Diet controlled.   Class I obesity: Body mass index is 34.87 kg/m. Would benefit from weight loss outpatient   Bipolar 1 disorder: Continue home Depakote  500 mg nightly,  Xanax  1 mg 3 times daily   Disposition.  At this time, patient is stable for disposition home with outpatient PCP follow-up.  Spoke with the patient's daughter at bedside regarding disposition.  Medical Consultants:   None.  Procedures:    MRI of the brain Subjective:   Today, patient was seen and examined at bedside.  Alert awake oriented and communicative.  States that she has remembered all the things.  Denies any headache dizziness lightheadedness.  Wants to go home.  Discharge Exam:   Vitals:   01/07/24 0602 01/07/24 1305  BP: (!) 145/90 (!) 144/91  Pulse: 73 73  Resp: 18 17  Temp: 97.8 F (36.6 C) 98.1 F (36.7 C)  SpO2: 94% 96%   Vitals:   01/06/24 1953 01/06/24 2137 01/07/24 0602 01/07/24 1305  BP: (!) 166/99  (!) 145/90 (!) 144/91  Pulse: 72  73 73  Resp: 18 (!) 22 18 17   Temp: 98.4 F (36.9 C)  97.8 F (36.6  C) 98.1 F (36.7 C)  TempSrc: Oral  Oral Oral  SpO2: 100%  94% 96%  Weight:      Height:       Body mass index is 34.87 kg/m.  General: Alert awake, not in obvious distress, obese built HENT: pupils equally reacting to light,  No scleral pallor or icterus noted. Oral mucosa is moist.  Chest:  Clear breath sounds.  Diminished breath sounds bilaterally.  CVS: S1 &S2 heard. No murmur.  Regular rate and  rhythm. Abdomen: Soft, nontender, nondistended.  Bowel sounds are heard.   Extremities: No cyanosis, clubbing or edema.  Peripheral pulses are palpable. Psych: Alert, awake and oriented, normal mood CNS:  No cranial nerve deficits.  Power equal in all extremities.   Skin: Warm and dry.  No rashes noted.  The results of significant diagnostics from this hospitalization (including imaging, microbiology, ancillary and laboratory) are listed below for reference.     Diagnostic Studies:   MR BRAIN WO CONTRAST Result Date: 01/04/2024 CLINICAL DATA:  Transient ischemic attack, memory loss, confusion yesterday. EXAM: MRI HEAD WITHOUT CONTRAST TECHNIQUE: Multiplanar, multiecho pulse sequences of the brain and surrounding structures were obtained without intravenous contrast. COMPARISON:  Earlier same day CT head. FINDINGS: Brain: Artifact likely related to dental hardware which slightly limits evaluation of the anterior inferior frontal lobes and anterior temporal lobes as well as the posterior fossa particularly on diffusion-weighted and susceptibility weighted images. Within these limitations there is no evidence of acute infarct. No evidence of hemorrhage within the visualized parenchyma. Few scattered areas of T2/FLAIR hyperintensity in the periventricular and subcortical white matter. 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 No edema, mass effect, or midline shift. Normal appearance of midline structures. The basilar cisterns are patent. No extra-axial fluid collections. Ventricles: Normal size and configuration of the ventricles. Vascular: Skull base flow voids are visualized. Skull and upper cervical spine: No focal abnormality. Sinuses/Orbits: Orbits are symmetric. Visualized paranasal sinuses are clear. Limited visualization of the maxillary sinuses. Other: Mastoid air cells are clear. IMPRESSION: No acute intracranial abnormality. Nonspecific scattered white matter signal abnormality favored to reflect  mild chronic microvascular ischemic changes. Differential includes migraine headaches, demyelinating process, or sequelae of prior infection/inflammation. 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 Electronically Signed   By: Denny Flack M.D.   On: 01/04/2024 16:59   CT Head Wo Contrast Result Date: 01/04/2024 CLINICAL DATA:  Head trauma, abnormal mental status (Age 46-64y). Issues with memory. EXAM: CT HEAD WITHOUT CONTRAST TECHNIQUE: Contiguous axial images were obtained from the base of the skull through the vertex without intravenous contrast. RADIATION DOSE REDUCTION: This exam was performed according to the departmental dose-optimization program which includes automated exposure control, adjustment of the mA and/or kV according to patient size and/or use of iterative reconstruction technique. COMPARISON:  None Available. FINDINGS: Brain: No acute intracranial abnormality. Specifically, no hemorrhage, hydrocephalus, mass lesion, acute infarction, or significant intracranial injury. Vascular: No hyperdense vessel or unexpected calcification. Skull: No acute calvarial abnormality. Sinuses/Orbits: No acute findings Other: None IMPRESSION: No acute intracranial abnormality. Electronically Signed   By: Janeece Mechanic M.D.   On: 01/04/2024 15:33   CT ABDOMEN PELVIS WO CONTRAST Result Date: 01/04/2024 CLINICAL DATA:  Epigastric pain, looking for metal for MRI. Occult malignancy evaluation. EXAM: CT ABDOMEN AND PELVIS WITHOUT CONTRAST TECHNIQUE: Multidetector CT imaging of the abdomen and pelvis was performed following the standard protocol without IV contrast. RADIATION DOSE REDUCTION: This exam was performed according to the departmental dose-optimization program  which includes automated exposure control, adjustment of the mA and/or kV according to patient size and/or use of iterative reconstruction technique. COMPARISON:  01/20/2008 FINDINGS: Lower chest: See chest CT report. Hepatobiliary: Prior  cholecystectomy. Diffuse fatty infiltration of the liver. No focal hepatic abnormality. Pancreas: No focal abnormality or ductal dilatation. Spleen: No focal abnormality.  Normal size. Adrenals/Urinary Tract: No adrenal abnormality. No focal renal abnormality. No stones or hydronephrosis. Urinary bladder is unremarkable. Stomach/Bowel: Normal appendix. Stomach, large and small bowel grossly unremarkable. Vascular/Lymphatic: Aortic atherosclerosis. No evidence of aneurysm or adenopathy. Reproductive: Uterus and adnexa unremarkable.  No mass. Other: No free fluid or free air. Musculoskeletal: No acute bony abnormality. Degenerative facet disease in the lower lumbar spine. IMPRESSION: Hepatic steatosis. Aortic atherosclerosis. No acute findings in the abdomen or pelvis. Electronically Signed   By: Janeece Mechanic M.D.   On: 01/04/2024 15:30   CT Chest Wo Contrast Result Date: 01/04/2024 CLINICAL DATA:  Evaluate for occult malignancy.  Epigastric pain. EXAM: CT CHEST WITHOUT CONTRAST TECHNIQUE: Multidetector CT imaging of the chest was performed following the standard protocol without IV contrast. RADIATION DOSE REDUCTION: This exam was performed according to the departmental dose-optimization program which includes automated exposure control, adjustment of the mA and/or kV according to patient size and/or use of iterative reconstruction technique. COMPARISON:  None Available. FINDINGS: Cardiovascular: Heart is normal size. Aorta is normal caliber. Coronary artery and aortic atherosclerosis. Mediastinum/Nodes: No mediastinal, hilar, or axillary adenopathy. Trachea and esophagus are unremarkable. Thyroid unremarkable. Lungs/Pleura: Mild centrilobular emphysema. Linear dependent atelectasis or scarring in the lung bases. No effusions. Upper Abdomen: No acute findings. Low-density throughout the liver compatible with fatty infiltration. Musculoskeletal: Chest wall soft tissues are unremarkable. No acute bony abnormality or  focal bone lesion. IMPRESSION: No acute cardiopulmonary disease. Coronary artery disease Aortic Atherosclerosis (ICD10-I70.0) and Emphysema (ICD10-J43.9). Electronically Signed   By: Janeece Mechanic M.D.   On: 01/04/2024 15:27     Labs:   Basic Metabolic Panel: Recent Labs  Lab 01/04/24 1405 01/05/24 0431 01/07/24 1216  NA 140 140 136  K 3.1* 3.0* 3.0*  CL 106 109 103  CO2 22 21* 24  GLUCOSE 122* 108* 92  BUN 8 9 11   CREATININE 0.70 0.86 1.01*  CALCIUM  9.4 9.2 9.7  MG  --  1.8 1.9  PHOS  --  3.2  --    GFR Estimated Creatinine Clearance: 73.3 mL/min (A) (by C-G formula based on SCr of 1.01 mg/dL (H)). Liver Function Tests: Recent Labs  Lab 01/04/24 1405  AST 20  ALT 20  ALKPHOS 76  BILITOT 0.9  PROT 7.3  ALBUMIN 4.2   No results for input(s): LIPASE, AMYLASE in the last 168 hours. Recent Labs  Lab 01/04/24 1405  AMMONIA 26   Coagulation profile No results for input(s): INR, PROTIME in the last 168 hours.  CBC: Recent Labs  Lab 01/04/24 1405 01/07/24 1216  WBC 8.5 7.1  NEUTROABS 5.5  --   HGB 13.7 13.9  HCT 41.2 42.0  MCV 92.6 93.8  PLT 317 317   Cardiac Enzymes: No results for input(s): CKTOTAL, CKMB, CKMBINDEX, TROPONINI in the last 168 hours. BNP: Invalid input(s): POCBNP CBG: No results for input(s): GLUCAP in the last 168 hours. D-Dimer No results for input(s): DDIMER in the last 72 hours. Hgb A1c Recent Labs    01/05/24 0431  HGBA1C 6.2*   Lipid Profile Recent Labs    01/05/24 0431  CHOL 142  HDL 52  LDLCALC 70  TRIG 102  CHOLHDL 2.7   Thyroid function studies No results for input(s): TSH, T4TOTAL, T3FREE, THYROIDAB in the last 72 hours.  Invalid input(s): FREET3 Anemia work up Recent Labs    01/05/24 0431  [id number]   Microbiology No results found for this or any previous visit (from the past 240 hours).   Discharge Instructions:   Discharge Instructions     Diet - low sodium  heart healthy   Complete by: As directed    Discharge instructions   Complete by: As directed    Follow up with your primary care provider in one week. Check blood work at that time.  Seek medical attention for worsening symptoms.  If you experience similar symptoms please discuss about getting neurology referral.   Increase activity slowly   Complete by: As directed       Allergies as of 01/07/2024   No Known Allergies      Medication List     TAKE these medications    ALPRAZolam  1 MG tablet Commonly known as: Xanax  Take 1 tablet (1 mg total) by mouth 3 (three) times daily.   buPROPion  150 MG 24 hr tablet Commonly known as: Wellbutrin  XL 3 qam What changed:  how much to take how to take this when to take this additional instructions   Cholecalciferol  25 MCG (1000 UT) tablet Take 1,000 Units by mouth daily.   diclofenac  sodium 1 % Gel Commonly known as: VOLTAREN  Apply 2 g topically 4 (four) times daily. What changed:  when to take this reasons to take this   divalproex  250 MG 24 hr tablet Commonly known as: Depakote  ER 1 at bedtime for 3 days then 2  qhs What changed:  how much to take how to take this when to take this additional instructions   fenofibrate micronized 134 MG capsule Commonly known as: LOFIBRA Take 134 mg by mouth daily.   fluticasone 50 MCG/ACT nasal spray Commonly known as: FLONASE Place 1 spray into both nostrils daily.   hydrochlorothiazide  25 MG tablet Commonly known as: HYDRODIURIL  Take 25 mg by mouth daily.   meloxicam 15 MG tablet Commonly known as: MOBIC Take 15 mg by mouth daily.   metoprolol  succinate 50 MG 24 hr tablet Commonly known as: TOPROL -XL Take 50 mg by mouth daily.   nitroGLYCERIN  0.2 mg/hr patch Commonly known as: Nitro-Dur  Place 1 patch (0.2 mg total) onto the skin daily.   Potassium Chloride  ER 20 MEQ Tbcr Take 20 mEq by mouth daily.   QUEtiapine  300 MG 24 hr tablet Commonly known as: SEROQUEL   XR Take 2 tablets (600 mg total) by mouth at bedtime.          Time coordinating discharge: 39 minutes  Signed:  Dwan Fennel  [facility] 01/07/2024, 5:01 PM

## 2024-01-08 ENCOUNTER — Telehealth: Payer: Self-pay

## 2024-01-08 NOTE — Transitions of Care (Post Inpatient/ED Visit) (Signed)
 01/08/2024  Name: Jill [name] MRN: [id number] DOB: 09/14/1965  Today's TOC FU Call Status: Today's TOC FU Call Status:: Successful TOC FU Call Completed TOC FU Call Complete Date: 01/08/24 Patient's Name and Date of Birth confirmed.  Transition Care Management Follow-up Telephone Call Date of Discharge: 01/07/24 Discharge Facility: [facility]) Type of Discharge: Inpatient Admission Primary Inpatient Discharge Diagnosis:: amnesia How have you been since you were released from the hospital?: Better  Items Reviewed: Did you receive and understand the discharge instructions provided?: No Medications obtained,verified, and reconciled?: Yes (Medications Reviewed) Any new allergies since your discharge?: No Dietary orders reviewed?: Yes Do you have support at home?: Yes People in Home [RPT]: child(ren), adult  Medications Reviewed Today: Medications Reviewed Today     Reviewed by Darrall Ellison, LPN (Licensed Practical Nurse) on 01/08/24 at 1212  Med List Status: <None>   Medication Order Taking? Sig Documenting Provider Last Dose Status Informant  ALPRAZolam  (XANAX ) 1 MG tablet [id number] Yes Take 1 tablet (1 mg total) by mouth 3 (three) times daily. Alba Ally, MD  Active Self, Pharmacy Records, Multiple Informants  buPROPion  (WELLBUTRIN  XL) 150 MG 24 hr tablet [id number] Yes 3 qam  Patient taking differently: Take 450 mg by mouth daily.   Alba Ally, MD  Active Self, Pharmacy Records, Multiple Informants  Cholecalciferol  25 MCG (1000 UT) tablet [id number] Yes Take 1,000 Units by mouth daily. [provider]  Active Self, Pharmacy Records, Multiple Informants           Med Note Montgomery Apgar Jan 04, 2024  3:42 PM)    diclofenac  sodium (VOLTAREN ) 1 % GEL [id number] Yes Apply 2 g topically 4 (four) times daily.  Patient taking differently: Apply 2 g topically 2 (two) times daily as needed (arthritis pain).   Darlis Eisenmenger, PA-C  Active Self,  Pharmacy Records, Multiple Informants  divalproex  (DEPAKOTE  ER) 250 MG 24 hr tablet [id number] Yes 1 at bedtime for 3 days then 2  qhs  Patient taking differently: Take 500 mg by mouth daily.   Alba Ally, MD  Active Self, Pharmacy Records, Multiple Informants  fenofibrate micronized (LOFIBRA) 134 MG capsule [id number] Yes Take 134 mg by mouth daily. [provider]  Active Self, Pharmacy Records, Multiple Informants  fluticasone (FLONASE) 50 MCG/ACT nasal spray [id number] Yes Place 1 spray into both nostrils daily. [provider]  Active Self, Pharmacy Records, Multiple Informants  hydrochlorothiazide  (HYDRODIURIL ) 25 MG tablet [id number] Yes Take 25 mg by mouth daily. [provider]  Active Self, Pharmacy Records, Multiple Informants  meloxicam (MOBIC) 15 MG tablet [id number] Yes Take 15 mg by mouth daily. [provider]  Active Self, Pharmacy Records, Multiple Informants  metoprolol  succinate (TOPROL -XL) 50 MG 24 hr tablet [id number] Yes Take 50 mg by mouth daily. [provider]  Active Self, Pharmacy Records, Multiple Informants           Med Note Montgomery Apgar Jan 04, 2024  3:45 PM)    nitroGLYCERIN  (NITRO-DUR ) 0.2 mg/hr patch [id number] Yes Place 1 patch (0.2 mg total) onto the skin daily. Orval Blanc, DPM  Active Self, Pharmacy Records, Multiple Informants  Potassium Chloride  ER 20 MEQ TBCR [id number] Yes Take 20 mEq by mouth daily. [provider]  Active Self, Pharmacy Records, Multiple Informants  QUEtiapine  (SEROQUEL  XR) 300 MG 24 hr tablet [id number] Yes Take 2 tablets (600 mg total) by mouth at bedtime. Alba Ally, MD  Active  Home Care and Equipment/Supplies: Were Home Health Services Ordered?: NA Any new equipment or medical supplies ordered?: NA  Functional Questionnaire: Do you need assistance with bathing/showering or dressing?: No Do you need assistance with meal preparation?: No Do  you need assistance with eating?: No Do you have difficulty maintaining continence: No Do you need assistance with getting out of bed/getting out of a chair/moving?: No Do you have difficulty managing or taking your medications?: No  Follow up appointments reviewed: PCP Follow-up appointment confirmed?: No (no avail appts, sent message to staff to schedule) Specialist Hospital Follow-up appointment confirmed?: NA Do you need transportation to your follow-up appointment?: No Do you understand care options if your condition(s) worsen?: Yes-patient verbalized understanding    SIGNATURE Darrall Ellison, LPN [facility] Nurse Health Advisor Direct Dial [phone]

## 2024-01-12 ENCOUNTER — Other Ambulatory Visit (HOSPITAL_COMMUNITY): Payer: Self-pay | Admitting: *Deleted

## 2024-01-15 ENCOUNTER — Other Ambulatory Visit: Payer: Self-pay

## 2024-01-15 ENCOUNTER — Ambulatory Visit: Admitting: Podiatry

## 2024-01-15 ENCOUNTER — Encounter: Payer: Self-pay | Admitting: Podiatry

## 2024-01-15 ENCOUNTER — Telehealth: Payer: Self-pay

## 2024-01-15 DIAGNOSIS — S86012A Strain of left Achilles tendon, initial encounter: Secondary | ICD-10-CM

## 2024-01-15 NOTE — Telephone Encounter (Signed)
-----   Message from Clemetine Cypher sent at 01/15/2024  4:12 PM EDT ----- Could you please order me an  Ankle MRI for Jill [name] for achilles tendon rupture and surgical consideration.? Please please please

## 2024-01-15 NOTE — Telephone Encounter (Signed)
 MRI Ankle left W/O contrast ordered for Achilles tendon rupture

## 2024-01-19 NOTE — Progress Notes (Signed)
 She presents today after having seen Dr. Mellody about 6 weeks ago.  She states that this heel is still giving if it is inhibiting my ability perform my daily activities and it hurts constantly.  She states that she started become depressed because of the pain.  She has tried immobilization with a splint and she has tried the heel lifts nitroglycerin  patches anti-inflammatories nothing seems to help it just feels like it does not want work.  Objective: Vital signs are stable alert oriented x 3 pulses are palpable.  She has swelling in her left leg she has pain on palpation of the Achilles with any dorsiflexion there appears to be some gapping of the medial fibers of the Achilles at its insertion site.  It is also warm to the touch.  She has no pain on medial-lateral compression of the calcaneus.  Review of radiographs do demonstrate retrocalcaneal heel spur and some calcification within the tendon which is most likely due to some old tearing.  Assessment: More than likely a rupture or a tear of the Achilles tendon.  Plan: Currently going to put her in a cam boot and I requested an MRI for evaluation of the integrity of the Achilles tendon.  In the meantime she will continue take Tylenol  ibuprofen  if necessary.  This is a surgical consideration and a opportunity to evaluate for differential diagnoses.

## 2024-01-29 NOTE — Addendum Note (Signed)
 Addended by: Brandyce Dimario on: 01/29/2024 10:38 AM   Modules accepted: Orders

## 2024-02-04 ENCOUNTER — Encounter: Payer: Self-pay | Admitting: Family Medicine

## 2024-02-04 ENCOUNTER — Ambulatory Visit (INDEPENDENT_AMBULATORY_CARE_PROVIDER_SITE_OTHER): Payer: Self-pay | Admitting: Family Medicine

## 2024-02-04 VITALS — BP 120/74 | HR 74 | Temp 97.8°F | Ht 67.0 in | Wt 225.0 lb

## 2024-02-04 DIAGNOSIS — E785 Hyperlipidemia, unspecified: Secondary | ICD-10-CM | POA: Diagnosis not present

## 2024-02-04 DIAGNOSIS — E6609 Other obesity due to excess calories: Secondary | ICD-10-CM

## 2024-02-04 DIAGNOSIS — E66812 Obesity, class 2: Secondary | ICD-10-CM

## 2024-02-04 DIAGNOSIS — E1169 Type 2 diabetes mellitus with other specified complication: Secondary | ICD-10-CM | POA: Diagnosis not present

## 2024-02-04 DIAGNOSIS — I700 Atherosclerosis of aorta: Secondary | ICD-10-CM | POA: Diagnosis not present

## 2024-02-04 DIAGNOSIS — Z6835 Body mass index (BMI) 35.0-35.9, adult: Secondary | ICD-10-CM

## 2024-02-04 DIAGNOSIS — E876 Hypokalemia: Secondary | ICD-10-CM

## 2024-02-04 DIAGNOSIS — E1165 Type 2 diabetes mellitus with hyperglycemia: Secondary | ICD-10-CM | POA: Diagnosis not present

## 2024-02-04 DIAGNOSIS — K760 Fatty (change of) liver, not elsewhere classified: Secondary | ICD-10-CM

## 2024-02-04 MED ORDER — POTASSIUM CHLORIDE ER 20 MEQ PO TBCR: 20.0000 meq | EXTENDED_RELEASE_TABLET | Freq: Every day | ORAL | 3 refills | Status: DC

## 2024-02-04 MED ORDER — MOUNJARO 2.5 MG/0.5ML ~~LOC~~ SOAJ: 2.5000 mg | SUBCUTANEOUS | 1 refills | Status: DC

## 2024-02-04 MED ORDER — SEMAGLUTIDE(0.25 OR 0.5MG/DOS) 2 MG/3ML ~~LOC~~ SOPN: 0.5000 mg | PEN_INJECTOR | SUBCUTANEOUS | 1 refills | Status: DC

## 2024-02-04 MED ORDER — ROSUVASTATIN CALCIUM 10 MG PO TABS: 10.0000 mg | ORAL_TABLET | Freq: Every day | ORAL | 2 refills | Status: AC

## 2024-02-04 NOTE — Progress Notes (Signed)
 I,Jameka J Llittleton, CMA,acting as a Neurosurgeon for [employer], NP.,have documented all relevant documentation on the behalf of Bruna Creighton, NP,as directed by  Bruna Creighton, NP while in the presence of Bruna Creighton, NP.  Subjective:  Patient ID: Jill [name] , female    DOB: 1965/10/28 , 58 y.o.   MRN: [id number]  Chief Complaint  Patient presents with   Hospitalization Follow-up    HPI  Follow up Hospitalization  Patient was admitted to [facility] on 01/04/24 and discharged on 01/07/24. She was treated for delirium and acute cystitis. Treatment for this included IV Rocephin  and Potassium for hypokalemia.. Telephone follow up was done on 01/08/24 She reports good compliance with treatment. She reports this condition is resolved.  ----------------------------------------------------------------------------------------- - Patient presents today for a hospital follow up. Patient reports she is feeling much better. She doesn't have any questions or concerns at this time.        Past Medical History:  Diagnosis Date   Anxiety    Bipolar 1 disorder (HCC)    Hypertension    Post-traumatic stress syndrome      Family History  Problem Relation Age of Onset   Drug abuse Mother    Alcohol abuse Maternal Uncle    Drug abuse Maternal Uncle      Current Outpatient Medications:    ALPRAZolam  (XANAX ) 1 MG tablet, Take 1 tablet (1 mg total) by mouth 3 (three) times daily., Disp: 90 tablet, Rfl: 4   buPROPion  (WELLBUTRIN  XL) 150 MG 24 hr tablet, 3 qam, Disp: 90 tablet, Rfl: 4   Cholecalciferol  25 MCG (1000 UT) tablet, Take 1,000 Units by mouth daily., Disp: , Rfl:    diclofenac  sodium (VOLTAREN ) 1 % GEL, Apply 2 g topically 4 (four) times daily., Disp: 100 g, Rfl: 0   divalproex  (DEPAKOTE  ER) 250 MG 24 hr tablet, 1 at bedtime for 3 days then 2  qhs, Disp: 60 tablet, Rfl: 4   fluticasone (FLONASE) 50 MCG/ACT nasal spray, Place 1 spray into both nostrils daily., Disp: , Rfl:     hydrochlorothiazide  (HYDRODIURIL ) 25 MG tablet, Take 25 mg by mouth daily., Disp: , Rfl:    meloxicam (MOBIC) 15 MG tablet, Take 15 mg by mouth daily., Disp: , Rfl:    metoprolol  succinate (TOPROL -XL) 50 MG 24 hr tablet, Take 50 mg by mouth daily., Disp: , Rfl:    nitroGLYCERIN  (NITRO-DUR ) 0.2 mg/hr patch, Place 1 patch (0.2 mg total) onto the skin daily., Disp: 30 patch, Rfl: 12   QUEtiapine  (SEROQUEL  XR) 300 MG 24 hr tablet, Take 2 tablets (600 mg total) by mouth at bedtime., Disp: 60 tablet, Rfl: 2   rosuvastatin  (CRESTOR ) 10 MG tablet, Take 1 tablet (10 mg total) by mouth daily., Disp: 90 tablet, Rfl: 2   Semaglutide ,0.25 or 0.5MG /DOS, 2 MG/3ML SOPN, Inject 0.5 mg into the skin every 7 (seven) days., Disp: 3 mL, Rfl: 1   Potassium Chloride  ER 20 MEQ TBCR, Take 1 tablet (20 mEq total) by mouth daily., Disp: 60 tablet, Rfl: 3   No Known Allergies   Review of Systems  Constitutional: Negative.   HENT: Negative.    Respiratory: Negative.    Cardiovascular: Negative.   Musculoskeletal: Negative.   Skin: Negative.   Neurological: Negative.   Hematological: Negative.   Psychiatric/Behavioral: Negative.       Today's Vitals   02/04/24 1059  BP: 120/74  Pulse: 74  Temp: 97.8 F (36.6 C)  TempSrc: Oral  Weight: 225 lb (102.1  kg)  Height: 5' 7 (1.702 m)  PainSc: 0-No pain   Body mass index is 35.24 kg/m.  Wt Readings from Last 3 Encounters:  02/04/24 225 lb (102.1 kg)  01/06/24 222 lb 10.6 oz (101 kg)  11/18/23 230 lb (104.3 kg)    The 10-year ASCVD risk score (Arnett DK, et al., 2019) is: 17.7%   Values used to calculate the score:     Age: 17 years     Clincally relevant sex: Female     Is Non-Hispanic African American: Yes     Diabetic: Yes     Tobacco smoker: Yes     Systolic Blood Pressure: 120 mmHg     Is BP treated: Yes     HDL Cholesterol: 52 mg/dL     Total Cholesterol: 142 mg/dL  Objective:  Physical Exam HENT:     Head: Normocephalic.  Cardiovascular:      Rate and Rhythm: Normal rate.  Pulmonary:     Effort: Pulmonary effort is normal.     Breath sounds: Normal breath sounds.  Skin:    General: Skin is warm and dry.  Neurological:     Mental Status: She is alert and oriented to person, place, and time. Mental status is at baseline.  Psychiatric:        Mood and Affect: Mood normal.         Assessment And Plan:  Type 2 diabetes mellitus with hyperglycemia, without long-term current use of insulin (HCC) -     CBC -     Basic metabolic panel with GFR -     Semaglutide (0.25 or 0.5MG /DOS); Inject 0.5 mg into the skin every 7 (seven) days.  Dispense: 3 mL; Refill: 1  Dyslipidemia due to type 2 diabetes mellitus (HCC) -     Rosuvastatin  Calcium ; Take 1 tablet (10 mg total) by mouth daily.  Dispense: 90 tablet; Refill: 2  Hypokalemia -     Magnesium -     Potassium Chloride  ER; Take 1 tablet (20 mEq total) by mouth daily.  Dispense: 60 tablet; Refill: 3  Fatty liver Assessment & Plan: Per CT chest 12/2023; start ozempic  once weekly SQ injections   Aortic atherosclerosis (HCC) Assessment & Plan: Per CT chest 12/2023; start ozempic  once weekly SQ injections   Class 2 obesity due to excess calories with body mass index (BMI) of 35.0 to 35.9 in adult, unspecified whether serious comorbidity present Assessment & Plan: She is encouraged to strive for BMI less than 30 to decrease cardiac risk. Advised to aim for at least 150 minutes of exercise per week.      Return in 4 months (on 06/06/2024), or if symptoms worsen or fail to improve, for diabetes.  Patient was given opportunity to ask questions. Patient verbalized understanding of the plan and was able to repeat key elements of the plan. All questions were answered to their satisfaction.    I, Bruna Creighton, NP, have reviewed all documentation for this visit. The documentation on 02/10/2024 for the exam, diagnosis, procedures, and orders are all accurate and complete.    IF YOU HAVE  BEEN REFERRED TO A SPECIALIST, IT MAY TAKE 1-2 WEEKS TO SCHEDULE/PROCESS THE REFERRAL. IF YOU HAVE NOT HEARD FROM US /SPECIALIST IN TWO WEEKS, PLEASE GIVE US  A CALL AT [phone] X [phone].

## 2024-02-05 LAB — CBC
Hematocrit: 43.1 % (ref 34.0–46.6)
Hemoglobin: 13.9 g/dL (ref 11.1–15.9)
MCH: 30.9 pg (ref 26.6–33.0)
MCHC: 32.3 g/dL (ref 31.5–35.7)
MCV: 96 fL (ref 79–97)
Platelets: 323 x10E3/uL (ref 150–450)
RBC: 4.5 x10E6/uL (ref 3.77–5.28)
RDW: 13.6 % (ref 11.7–15.4)
WBC: 7.6 x10E3/uL (ref 3.4–10.8)

## 2024-02-05 LAB — BASIC METABOLIC PANEL WITH GFR
BUN/Creatinine Ratio: 19 (ref 9–23)
BUN: 18 mg/dL (ref 6–24)
CO2: 21 mmol/L (ref 20–29)
Calcium: 9.9 mg/dL (ref 8.7–10.2)
Chloride: 105 mmol/L (ref 96–106)
Creatinine, Ser: 0.96 mg/dL (ref 0.57–1.00)
Glucose: 109 mg/dL — ABNORMAL HIGH (ref 70–99)
Potassium: 4.8 mmol/L (ref 3.5–5.2)
Sodium: 141 mmol/L (ref 134–144)
eGFR: 68 mL/min/1.73 (ref >59)

## 2024-02-05 LAB — MAGNESIUM: Magnesium: 2 mg/dL (ref 1.6–2.3)

## 2024-02-09 ENCOUNTER — Ambulatory Visit: Payer: Self-pay | Admitting: Family Medicine

## 2024-02-09 DIAGNOSIS — K760 Fatty (change of) liver, not elsewhere classified: Secondary | ICD-10-CM | POA: Insufficient documentation

## 2024-02-09 DIAGNOSIS — I700 Atherosclerosis of aorta: Secondary | ICD-10-CM | POA: Insufficient documentation

## 2024-02-09 NOTE — Assessment & Plan Note (Signed)
 Per CT chest 12/2023; start ozempic  once weekly SQ injections

## 2024-02-09 NOTE — Telephone Encounter (Signed)
 Copied from CRM [id number]. Topic: Clinical - Lab/Test Results >> Feb 09, 2024  3:50 PM Fonda T wrote:  Reason for CRM: Patient states she is returning call to discuss lab results.   Per chart message, voicemail left, called office, no answer.   Dropped call occurred, while patient on hold due to sytem issues.   Reached back out to patient. Informed of lab results as per chart message.   Per patient can call back if need to discuss results further. Can be reached at [phone].  Thank you.

## 2024-02-09 NOTE — Assessment & Plan Note (Signed)
 She is encouraged to strive for BMI less than 30 to decrease cardiac risk. Advised to aim for at least 150 minutes of exercise per week.

## 2024-02-09 NOTE — Progress Notes (Signed)
 Labs are normal.

## 2024-02-11 ENCOUNTER — Ambulatory Visit (HOSPITAL_BASED_OUTPATIENT_CLINIC_OR_DEPARTMENT_OTHER): Admitting: Psychiatry

## 2024-02-11 DIAGNOSIS — F3110 Bipolar disorder, current episode manic without psychotic features, unspecified: Secondary | ICD-10-CM

## 2024-02-11 MED ORDER — ALPRAZOLAM 1 MG PO TABS: 1.0000 mg | ORAL_TABLET | Freq: Three times a day (TID) | ORAL | 4 refills | Status: DC

## 2024-02-11 MED ORDER — BUPROPION HCL ER (XL) 150 MG PO TB24: ORAL_TABLET | ORAL | 4 refills | Status: DC

## 2024-02-11 MED ORDER — QUETIAPINE FUMARATE ER 300 MG PO TB24: 600.0000 mg | ORAL_TABLET | Freq: Every day | ORAL | 2 refills | Status: DC

## 2024-02-11 NOTE — Progress Notes (Signed)
 [facility] MD/PA/NP OP Progress Note  11/14/2020 3:56 PM Jill [name]  MRN:  [id number]    Today the patient is doing better.  Since I have seen in years she had an episode of hypokalemia and what sounds like delirium.  She was hospitalized for 3 or 4 days.  They believe the hypokalemia might have come from a urinary tract infection.  Nonetheless the patient is feeling physically well at this time.  She also changed her jobs.  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  She thinks it is okay.  She is actually doing quite well.  She believes the Depakote  that we started has been helpful.  She is much less irritable and she attributes it to the Depakote .  For now she will continue Xanax  1 mg 3 times daily and she will continue on the higher dose of Seroquel  600 mg.  She is also has some changes in her diabetic medications.  Once again I shared with him the information that Seroquel  is not the best medicine to be 1 when your diabetes is not well-controlled.  Noted also when she was hospitalized she had a CAT scan that showed signs of a small stroke.  She never had clinical symptoms from it but it tells her the importance of getting her cardiovascular risk factors under better control.  Her blood pressure is now better.  She will be working on her blood sugar.  She agreed that on her next visit we will start consider reducing her Seroquel  significantly.  For now she will continue taking Depakote  and the Wellbutrin  and the Xanax .  Her relationship with her son is gotten better. No diagnosis found.  Past Psychiatric History: Please see intake H&P.  Past Medical History:  Past Medical History:  Diagnosis Date   Anxiety    Bipolar 1 disorder (HCC)    Hypertension    Post-traumatic stress syndrome     Past Surgical History:  Procedure Laterality Date   BLADDER SURGERY     CHOLECYSTECTOMY     TUBAL  LIGATION      Family Psychiatric History: Reviewed.  Family History:  Family History  Problem Relation Age of Onset   Drug abuse Mother    Alcohol abuse Maternal Uncle    Drug abuse Maternal Uncle     Social History:  Social History   Socioeconomic History   Marital status: Single    Spouse name: Not on file   Number of children: 2   Years of education: 14   Highest education level: Not on file  Occupational History   Not on file  Tobacco Use   Smoking status: Every Day    Current packs/day: 0.00    Types: Cigarettes    Last attempt to quit: 08/29/2016    Years since quitting: 7.4   Smokeless tobacco: Never  Vaping Use   Vaping status: Never Used  Substance and Sexual Activity   Alcohol use: No   Drug use: Not Currently    Types: Marijuana   Sexual activity: Never    Birth control/protection: Surgical  Other Topics Concern   Not on file  Social History Narrative   Pt lives with her son in [place]. Pt works at [employer]. Born and raised in [place] by mom until 9 then by grandparents. Pt has one older brother. Pt is at [employer]  and studying medical billing and coding. Never married and has 2 kids.    Social Drivers of Corporate investment banker Strain: Not on file  Food Insecurity: No Food Insecurity (01/05/2024)   Hunger Vital Sign    Worried About Running Out of Food in the Last Year: Never true    Ran Out of Food in the Last Year: Never true  Transportation Needs: No Transportation Needs (01/05/2024)   PRAPARE - Administrator, Civil Service (Medical): No    Lack of Transportation (Non-Medical): No  Physical Activity: Not on file  Stress: Not on file  Social Connections: Unknown (01/05/2024)   Social Connection and Isolation Panel    Frequency of Communication with Friends and Family: More than three times a week    Frequency of Social Gatherings with Friends and Family: More than three times a week    Attends Religious Services: 1 to 4 times per  year    Active Member of Clubs or Organizations: No    Attends Engineer, structural: Never    Marital Status: Patient declined    Allergies:  No Known Allergies   Metabolic Disorder Labs: Lab Results  Component Value Date   HGBA1C 6.2 (H) 01/05/2024   MPG 131 01/05/2024   No results found for: PROLACTIN Lab Results  Component Value Date   CHOL 142 01/05/2024   TRIG 102 01/05/2024   HDL 52 01/05/2024   CHOLHDL 2.7 01/05/2024   VLDL 20 01/05/2024   LDLCALC 70 01/05/2024   LDLCALC 58 11/13/2023   Lab Results  Component Value Date   TSH 0.784 01/04/2024   TSH 0.643 01/14/2020    Therapeutic Level Labs: No results found for: LITHIUM No results found for: VALPROATE No results found for: CBMZ  Current Medications: Current Outpatient Medications  Medication Sig Dispense Refill   ALPRAZolam  (XANAX ) 1 MG tablet Take 1 tablet (1 mg total) by mouth 3 (three) times daily. 90 tablet 4   buPROPion  (WELLBUTRIN  XL) 150 MG 24 hr tablet 3 qam 90 tablet 4   Cholecalciferol  25 MCG (1000 UT) tablet Take 1,000 Units by mouth daily.     diclofenac  sodium (VOLTAREN ) 1 % GEL Apply 2 g topically 4 (four) times daily. 100 g 0   divalproex  (DEPAKOTE  ER) 250 MG 24 hr tablet 1 at bedtime for 3 days then 2  qhs 60 tablet 4   fluticasone (FLONASE) 50 MCG/ACT nasal spray Place 1 spray into both nostrils daily.     hydrochlorothiazide  (HYDRODIURIL ) 25 MG tablet Take 25 mg by mouth daily.     meloxicam (MOBIC) 15 MG tablet Take 15 mg by mouth daily.     metoprolol  succinate (TOPROL -XL) 50 MG 24 hr tablet Take 50 mg by mouth daily.     nitroGLYCERIN  (NITRO-DUR ) 0.2 mg/hr patch Place 1 patch (0.2 mg total) onto the skin daily. 30 patch 12   Potassium Chloride  ER 20 MEQ TBCR Take 1 tablet (20 mEq total) by mouth daily. 60 tablet 3   QUEtiapine  (SEROQUEL  XR) 300 MG 24 hr tablet Take 2 tablets (600 mg total) by mouth at bedtime. 60 tablet 2   rosuvastatin  (CRESTOR ) 10 MG tablet Take 1  tablet (10 mg total) by mouth daily. 90 tablet 2   Semaglutide ,0.25 or 0.5MG /DOS, 2 MG/3ML SOPN Inject 0.5 mg into the skin every 7 (seven) days. 3 mL 1   No current facility-administered medications for this visit.      Psychiatric Specialty Exam: Review  of Systems  Psychiatric/Behavioral:  The patient is nervous/anxious.   All other systems reviewed and are negative.   Last menstrual period 10/12/2011.There is no height or weight on file to calculate BMI.  General Appearance: NA  Eye Contact:  NA  Speech:  Clear and Coherent and Normal Rate  Volume:  Normal  Mood:  Anxious  Affect:  NA  Thought Process:  Goal Directed and Linear  Orientation:  Full (Time, Place, and Person)  Thought Content: Logical   Suicidal Thoughts:  No  Homicidal Thoughts:  No  Memory:  Immediate;   Good Recent;   Good Remote;   Good  Judgement:  Good  Insight:  Good  Psychomotor Activity:  NA  Concentration:  Concentration: Good  Recall:  Good  Fund of Knowledge: Good  Language: Good  Akathisia:  Negative  Handed:  Right  AIMS (if indicated): not done  Assets:  Communication Skills Desire for Improvement Financial Resources/Insurance Housing Resilience Talents/Skills  ADL's:  Intact  Cognition: WNL  Sleep:  Good   Screenings: AIMS    Flowsheet Row [facility]  AIMS Total Score 0   GAD-7    Flowsheet Row Office Visit from 02/04/2024 in [facility] Office Visit from 11/13/2023 in [facility] Counselor from 02/12/2021 in [facility] at [facility]  Total GAD-7 Score 0 0 17   PHQ2-9    Flowsheet Row Office Visit from 02/04/2024 in [facility] Office Visit from 11/13/2023 in [facility] Counselor from 02/12/2021 in [facility] at  [facility] Total Score 0 0 2  PHQ-9 Total Score 0 0 10   Flowsheet Row ED to Hosp-Admission (Discharged) from 01/04/2024 in [facility] from 11/18/2023 in [facility] at [facility] ED from 10/18/2023 in [facility] Emergency Department at [facility]  C-SSRS RISK CATEGORY No Risk No Risk No Risk     Assessment and Plan:     Today emotionally the patient is doing better.  She will continue taking Depakote  250 mg 2 a day.  I believe it had some significant benefit.  She will continue taking Wellbutrin  as prescribed and for now she will continue Seroquel  600 mg.  Her next visit we will reduce some of the Seroquel .  She will also continue Xanax  1 mg 3 times daily.  Patient return to see me in 3 months.  I believe she is much more stable than she was on her last visit. 11/14/2020, 3:56 PM

## 2024-02-23 ENCOUNTER — Telehealth: Payer: Self-pay | Admitting: Family Medicine

## 2024-02-23 NOTE — Telephone Encounter (Signed)
 Copied from CRM [id number]. Topic: Clinical - Medication Refill >> Feb 23, 2024  3:49 PM Delon HERO wrote: Medication: metoprolol  succinate (TOPROL -XL) 50 MG 24 hr tablet [[id number]]   fluticasone  (FLONASE ) 50 MCG/ACT nasal spray [[id number]]  New Patient appointment 02/04/2024 Has the patient contacted their pharmacy? Yes (Agent: If no, request that the patient contact the pharmacy for the refill. If patient does not wish to contact the pharmacy document the reason why and proceed with request.) (Agent: If yes, when and what did the pharmacy advise?)  This is the patient's preferred pharmacy:  [facility] [address] ([address]), [place] - [address] [place] ([place]) [place] [address] Phone: [phone] Fax: [phone]  Is this the correct pharmacy for this prescription? Yes If no, delete pharmacy and type the correct one.   Has the prescription been filled recently? Yes  Is the patient out of the medication? Yes took last pill this morning  Has the patient been seen for an appointment in the last year OR does the patient have an upcoming appointment? Yes  Can we respond through MyChart? Yes  Agent: Please be advised that Rx refills may take up to 3 business days. We ask that you follow-up with your pharmacy.

## 2024-02-25 MED ORDER — FLUTICASONE PROPIONATE 50 MCG/ACT NA SUSP: 1.0000 | Freq: Every day | NASAL | 0 refills | Status: DC

## 2024-02-25 MED ORDER — METOPROLOL SUCCINATE ER 50 MG PO TB24: 50.0000 mg | ORAL_TABLET | Freq: Every day | ORAL | 0 refills | Status: DC

## 2024-02-27 ENCOUNTER — Emergency Department (HOSPITAL_COMMUNITY)
Admission: EM | Admit: 2024-02-27 | Discharge: 2024-02-27 | Disposition: A | Discharge status: Home / Self Care | Attending: Emergency Medicine | Admitting: Emergency Medicine

## 2024-02-27 ENCOUNTER — Encounter (HOSPITAL_COMMUNITY): Payer: Self-pay

## 2024-02-27 ENCOUNTER — Ambulatory Visit (HOSPITAL_BASED_OUTPATIENT_CLINIC_OR_DEPARTMENT_OTHER)

## 2024-02-27 ENCOUNTER — Other Ambulatory Visit: Payer: Self-pay

## 2024-02-27 DIAGNOSIS — M79621 Pain in right upper arm: Secondary | ICD-10-CM | POA: Diagnosis present

## 2024-02-27 DIAGNOSIS — L732 Hidradenitis suppurativa: Secondary | ICD-10-CM | POA: Insufficient documentation

## 2024-02-27 MED ORDER — LIDOCAINE-EPINEPHRINE (PF) 2 %-1:200000 IJ SOLN
20.0000 mL | Freq: Once | INTRAMUSCULAR | Status: AC
Start: 2024-02-27 — End: 2024-02-27
  Filled 2024-02-27: qty 20

## 2024-02-27 MED ORDER — DOXYCYCLINE HYCLATE 100 MG PO CAPS
100.0000 mg | ORAL_CAPSULE | Freq: Two times a day (BID) | ORAL | 0 refills | Status: DC
Start: 2024-02-27 — End: 2024-06-09

## 2024-02-27 MED ORDER — LIDOCAINE-EPINEPHRINE (PF) 2 %-1:200000 IJ SOLN
INTRAMUSCULAR | Status: AC
  Administered 2024-02-27: 20 mL
  Filled 2024-02-27: qty 20

## 2024-02-27 MED ORDER — SODIUM CHLORIDE 0.9 % IV SOLN: 100.0000 mg | Freq: Once | INTRAVENOUS | Status: DC

## 2024-02-27 NOTE — ED Triage Notes (Signed)
 Pt has an abscess in her right axilla x 1 week. Pt states that it had to be drained before.

## 2024-02-27 NOTE — ED Provider Notes (Signed)
 [place] EMERGENCY DEPARTMENT AT [facility] Provider Note   CSN: [id number] Arrival date & time: 02/27/24  8161     Patient presents with: Abscess   Jill [name] is a 58 y.o. female who presents to the ED today with concerns of an abscess that has been recurrently present on the right axilla.  Previously drained 3 months ago, she states that she has a lot of increased pressure and pain in the right axilla in the same area as before.  She has not followed with dermatology and has no other treatments undergoing at this time.    Abscess      Prior to Admission medications   Medication Sig Start Date End Date Taking? Authorizing Provider  ALPRAZolam  (XANAX ) 1 MG tablet Take 1 tablet (1 mg total) by mouth 3 (three) times daily. 02/11/24 02/10/25  Plovsky, Elna, MD  buPROPion  (WELLBUTRIN  XL) 150 MG 24 hr tablet 3 qam 02/11/24   Plovsky, Elna, MD  Cholecalciferol  25 MCG (1000 UT) tablet Take 1,000 Units by mouth daily.    [provider]  diclofenac  sodium (VOLTAREN ) 1 % GEL Apply 2 g topically 4 (four) times daily. 03/18/19   Beverley Leita LABOR, PA-C  divalproex  (DEPAKOTE  ER) 250 MG 24 hr tablet 1 at bedtime for 3 days then 2  qhs 12/19/23   Plovsky, Elna, MD  fluticasone  (FLONASE ) 50 MCG/ACT nasal spray Place 1 spray into both nostrils daily. 02/25/24   Petrina Pries, NP  hydrochlorothiazide  (HYDRODIURIL ) 25 MG tablet Take 25 mg by mouth daily.    [provider]  meloxicam (MOBIC) 15 MG tablet Take 15 mg by mouth daily. 12/19/23   [provider]  metoprolol  succinate (TOPROL -XL) 50 MG 24 hr tablet Take 1 tablet (50 mg total) by mouth daily. 02/25/24   Petrina Pries, NP  nitroGLYCERIN  (NITRO-DUR ) 0.2 mg/hr patch Place 1 patch (0.2 mg total) onto the skin daily. 11/19/23   Scherrie Lamarr SQUIBB, DPM  Potassium Chloride  ER 20 MEQ TBCR Take 1 tablet (20 mEq total) by mouth daily. 02/04/24   Petrina Pries, NP  QUEtiapine  (SEROQUEL  XR) 300 MG 24 hr tablet Take 2  tablets (600 mg total) by mouth at bedtime. 02/11/24   Plovsky, Elna, MD  rosuvastatin  (CRESTOR ) 10 MG tablet Take 1 tablet (10 mg total) by mouth daily. 02/04/24 02/03/25  Petrina Pries, NP  Semaglutide ,0.25 or 0.5MG /DOS, 2 MG/3ML SOPN Inject 0.5 mg into the skin every 7 (seven) days. 02/04/24   Petrina Pries, NP    Allergies: Patient has no known allergies.    Review of Systems  Skin:  Positive for rash.  All other systems reviewed and are negative.   Updated Vital Signs BP (!) 180/122 (BP Location: Left Arm)   Pulse 85   Temp 98.4 F (36.9 C) (Oral)   Resp 18   Ht 5' 7 (1.702 m)   Wt 102.1 kg   LMP 10/12/2011   SpO2 97%   BMI 35.24 kg/m   Physical Exam Vitals and nursing note reviewed.  Constitutional:      General: She is not in acute distress.    Appearance: She is well-developed.  HENT:     Head: Normocephalic and atraumatic.  Eyes:     Conjunctiva/sclera: Conjunctivae normal.  Cardiovascular:     Rate and Rhythm: Normal rate and regular rhythm.     Heart sounds: No murmur heard. Pulmonary:     Effort: Pulmonary effort is normal. No respiratory distress.  Breath sounds: Normal breath sounds.  Abdominal:     Palpations: Abdomen is soft.     Tenderness: There is no abdominal tenderness.  Musculoskeletal:        General: No swelling.     Cervical back: Neck supple.  Skin:    General: Skin is warm and dry.     Capillary Refill: Capillary refill takes less than 2 seconds.     Findings: Abscess present.     Comments: Notable abscesses with noted with sinus tracts in the right axilla.  Neurological:     Mental Status: She is alert.  Psychiatric:        Mood and Affect: Mood normal.     (all labs ordered are listed, but only abnormal results are displayed) Labs Reviewed - No data to display  EKG: None  Radiology: No results found.   .Incision and Drainage  Date/Time: 02/27/2024 9:13 PM  Performed by: Myriam Dorn BROCKS, PA Authorized by: Myriam Dorn BROCKS, PA   Consent:    Consent obtained:  Verbal   Consent given by:  Patient   Risks, benefits, and alternatives were discussed: yes     Risks discussed:  Bleeding, incomplete drainage, pain and infection   Alternatives discussed:  No treatment, delayed treatment, alternative treatment and observation Universal protocol:    Procedure explained and questions answered to patient or proxy's satisfaction: yes     Patient identity confirmed:  Verbally with patient, arm band and hospital-assigned identification number Location:    Type:  Abscess   Size:  3 cm   Location:  Trunk   Trunk location:  Chest Pre-procedure details:    Skin preparation:  Chlorhexidine Sedation:    Sedation type:  None Anesthesia:    Anesthesia method:  Local infiltration   Local anesthetic:  Lidocaine  2% WITH epi Procedure type:    Complexity:  Simple Procedure details:    Ultrasound guidance: no     Needle aspiration: no     Incision types:  Stab incision   Incision depth:  Dermal   Wound management:  Probed and deloculated   Drainage:  Serosanguinous and purulent   Drainage amount:  Moderate   Wound treatment:  Wound left open   Packing materials:  1/4 in iodoform gauze   Amount 1/4 iodoform:  4 cm Post-procedure details:    Procedure completion:  Tolerated well, no immediate complications Comments:     Patient with dressing over wound    Medications Ordered in the ED  lidocaine -EPINEPHrine  (XYLOCAINE  W/EPI) 2 %-1:200000 (PF) injection 20 mL (20 mLs Infiltration Given 02/27/24 2033)                                    Medical Decision Making Risk Prescription drug management.   Medical Decision Making:   Jill [name] is a 58 y.o. female who presented to the ED today with concerns of abscess detailed above.     Complete initial physical exam performed, notably the patient  was alert and oriented in no apparent distress.  Physical exam as noted.    Reviewed and confirmed nursing  documentation for past medical history, family history, social history.    Initial Assessment:   With the patient's presentation of abscess to the right axilla, most likely diagnosis is subcutaneous abscess and/or hidradenitis suppurativa.    Initial Plan:  Incision and drainage as noted on the procedure note Plan  today to make dermatology referral Objective evaluation as below reviewed    Reassessment and Plan:   Successful I&D as noted in the procedure note, sent outpatient prescription for doxycycline  for wound prophylaxis, also sent dermatology referral for likely hidradenitis suppurativa of the right axilla.       Final diagnoses:  Hidradenitis suppurativa of right axilla    ED Discharge Orders     None          Myriam Dorn BROCKS, [place] 02/27/24 2118    Armenta Canning, MD 02/29/24 [phone]

## 2024-03-17 ENCOUNTER — Encounter: Admitting: Family Medicine

## 2024-03-17 NOTE — Progress Notes (Deleted)
 I,Jameka J Llittleton, CMA,acting as a Neurosurgeon for [employer], NP.,have documented all relevant documentation on the behalf of Bruna Creighton, NP,as directed by  Bruna Creighton, NP while in the presence of Bruna Creighton, NP.  Subjective:    Patient ID: Jill [name] , female    DOB: [birth year] , 58 y.o.   MRN: [id number]  No chief complaint on file.   HPI  Patient presents today for a physical. Patient reports compliance with her meds. Patient denies having headaches, sob or chest pain. Patient is followed by Dr... for her GYN care.  Patient doesn't have any questions or concerns at this time.     Past Medical History:  Diagnosis Date  . Anxiety   . Bipolar 1 disorder (HCC)   . Hypertension   . Post-traumatic stress syndrome      Family History  Problem Relation Age of Onset  . Drug abuse Mother   . Alcohol abuse Maternal Uncle   . Drug abuse Maternal Uncle      Current Outpatient Medications:  .  ALPRAZolam  (XANAX ) 1 MG tablet, Take 1 tablet (1 mg total) by mouth 3 (three) times daily., Disp: 90 tablet, Rfl: 4 .  buPROPion  (WELLBUTRIN  XL) 150 MG 24 hr tablet, 3 qam, Disp: 90 tablet, Rfl: 4 .  Cholecalciferol  25 MCG (1000 UT) tablet, Take 1,000 Units by mouth daily., Disp: , Rfl:  .  diclofenac  sodium (VOLTAREN ) 1 % GEL, Apply 2 g topically 4 (four) times daily., Disp: 100 g, Rfl: 0 .  divalproex  (DEPAKOTE  ER) 250 MG 24 hr tablet, 1 at bedtime for 3 days then 2  qhs, Disp: 60 tablet, Rfl: 4 .  doxycycline  (VIBRAMYCIN ) 100 MG capsule, Take 1 capsule (100 mg total) by mouth 2 (two) times daily., Disp: 20 capsule, Rfl: 0 .  fluticasone  (FLONASE ) 50 MCG/ACT nasal spray, Place 1 spray into both nostrils daily., Disp: 1 g, Rfl: 0 .  hydrochlorothiazide  (HYDRODIURIL ) 25 MG tablet, Take 25 mg by mouth daily., Disp: , Rfl:  .  meloxicam (MOBIC) 15 MG tablet, Take 15 mg by mouth daily., Disp: , Rfl:  .  metoprolol  succinate (TOPROL -XL) 50 MG 24 hr tablet, Take 1 tablet (50 mg total) by mouth  daily., Disp: 30 tablet, Rfl: 0 .  nitroGLYCERIN  (NITRO-DUR ) 0.2 mg/hr patch, Place 1 patch (0.2 mg total) onto the skin daily., Disp: 30 patch, Rfl: 12 .  Potassium Chloride  ER 20 MEQ TBCR, Take 1 tablet (20 mEq total) by mouth daily., Disp: 60 tablet, Rfl: 3 .  QUEtiapine  (SEROQUEL  XR) 300 MG 24 hr tablet, Take 2 tablets (600 mg total) by mouth at bedtime., Disp: 60 tablet, Rfl: 2 .  rosuvastatin  (CRESTOR ) 10 MG tablet, Take 1 tablet (10 mg total) by mouth daily., Disp: 90 tablet, Rfl: 2 .  Semaglutide ,0.25 or 0.5MG /DOS, 2 MG/3ML SOPN, Inject 0.5 mg into the skin every 7 (seven) days., Disp: 3 mL, Rfl: 1   No Known Allergies    The patient states she uses {contraceptive methods:5051} for birth control. Patient's last menstrual period was 10/12/2011.. {Dysmenorrhea-menorrhagia:21918}. Negative for: breast discharge, breast lump(s), breast pain and breast self exam. Associated symptoms include abnormal vaginal bleeding. Pertinent negatives include abnormal bleeding (hematology), anxiety, decreased libido, depression, difficulty falling sleep, dyspareunia, history of infertility, nocturia, sexual dysfunction, sleep disturbances, urinary incontinence, urinary urgency, vaginal discharge and vaginal itching. Diet regular.The patient states her exercise level is    . The patient's tobacco use is:  Social History   Tobacco Use  Smoking Status Every Day  . Current packs/day: 0.00  . Types: Cigarettes  . Last attempt to quit: 08/29/2016  . Years since quitting: 7.5  Smokeless Tobacco Never  . She has been exposed to passive smoke. The patient's alcohol use is:  Social History   Substance and Sexual Activity  Alcohol Use No  . Additional information: Last pap ***, next one scheduled for ***.    Review of Systems   There were no vitals filed for this visit. There is no height or weight on file to calculate BMI.  Wt Readings from Last 3 Encounters:  02/27/24 225 lb (102.1 kg)  02/04/24 225 lb  (102.1 kg)  01/06/24 222 lb 10.6 oz (101 kg)     Objective:  Physical Exam      Assessment And Plan:     Encounter for general adult medical examination w/o abnormal findings  Dyslipidemia due to type 2 diabetes mellitus (HCC)  Aortic atherosclerosis (HCC)  Primary hypertension     No follow-ups on file. Patient was given opportunity to ask questions. Patient verbalized understanding of the plan and was able to repeat key elements of the plan. All questions were answered to their satisfaction.   Bruna Creighton, NP  I, Bruna Creighton, NP, have reviewed all documentation for this visit. The documentation on 03/17/24 for the exam, diagnosis, procedures, and orders are all accurate and complete.

## 2024-03-18 ENCOUNTER — Encounter: Admitting: Family Medicine

## 2024-03-26 ENCOUNTER — Other Ambulatory Visit: Payer: Self-pay | Admitting: Family Medicine

## 2024-03-31 ENCOUNTER — Encounter: Admitting: Family Medicine

## 2024-04-28 ENCOUNTER — Other Ambulatory Visit: Payer: Self-pay | Admitting: Family Medicine

## 2024-04-28 DIAGNOSIS — E1165 Type 2 diabetes mellitus with hyperglycemia: Secondary | ICD-10-CM

## 2024-05-07 ENCOUNTER — Other Ambulatory Visit: Payer: Self-pay | Admitting: Family Medicine

## 2024-05-07 NOTE — Telephone Encounter (Signed)
 Copied from CRM [id number]. Topic: Clinical - Medication Refill >> May 07, 2024 10:34 AM Jill [name] wrote: Medication:  hydrochlorothiazide  (HYDRODIURIL ) 25 MG tablet **was originally prescribed by her previous PCP fluticasone  (FLONASE ) 50 MCG/ACT nasal spray  Has the patient contacted their pharmacy? Yes, call dr  This is the patient's preferred pharmacy:  [facility] [address] [place]), [place] - [address] [place]) [place] [address] Phone: [phone] Fax: [phone]  Is this the correct pharmacy for this prescription? Yes If no, delete pharmacy and type the correct one.   Has the prescription been filled recently? No  Is the patient out of the medication? Yes  Has the patient been seen for an appointment in the last year OR does the patient have an upcoming appointment? Yes  Can we respond through MyChart? Yes  Agent: Please be advised that Rx refills may take up to 3 business days. We ask that you follow-up with your pharmacy.

## 2024-05-10 MED ORDER — FLUTICASONE PROPIONATE 50 MCG/ACT NA SUSP: 1.0000 | Freq: Every day | NASAL | 0 refills | Status: AC

## 2024-05-10 MED ORDER — HYDROCHLOROTHIAZIDE 25 MG PO TABS: 25.0000 mg | ORAL_TABLET | Freq: Every day | ORAL | 2 refills | Status: AC

## 2024-05-18 ENCOUNTER — Ambulatory Visit (HOSPITAL_COMMUNITY): Admitting: Psychiatry

## 2024-05-18 DIAGNOSIS — F320 Major depressive disorder, single episode, mild: Secondary | ICD-10-CM

## 2024-05-18 MED ORDER — DIVALPROEX SODIUM ER 250 MG PO TB24: ORAL_TABLET | ORAL | 4 refills | Status: AC

## 2024-05-18 MED ORDER — BUPROPION HCL ER (XL) 150 MG PO TB24: ORAL_TABLET | ORAL | 4 refills | Status: DC

## 2024-05-18 MED ORDER — QUETIAPINE FUMARATE ER 200 MG PO TB24: ORAL_TABLET | ORAL | 4 refills | Status: DC

## 2024-05-18 MED ORDER — ALPRAZOLAM 1 MG PO TABS: 1.0000 mg | ORAL_TABLET | Freq: Three times a day (TID) | ORAL | 4 refills | Status: DC

## 2024-05-18 NOTE — Progress Notes (Signed)
 [facility] MD/PA/NP OP Progress Note  11/14/2020 3:55 PM Jill [name]  MRN:  [id number]      Today the patient is at her baseline.  She actually is interviewing for a new job which should get paid more money.  She is looking forward to that.  Overall her mood is good.  She denies daily depression.  Her anxiety level is well-controlled.  She is on a weight losing medication for diabetes.  The patient is very stable at this time. No diagnosis found.  Past Psychiatric History: Please see intake H&P.  Past Medical History:  Past Medical History:  Diagnosis Date   Anxiety    Bipolar 1 disorder (HCC)    Hypertension    Post-traumatic stress syndrome     Past Surgical History:  Procedure Laterality Date   BLADDER SURGERY     CHOLECYSTECTOMY     TUBAL LIGATION      Family Psychiatric History: Reviewed.  Family History:  Family History  Problem Relation Age of Onset   Drug abuse Mother    Alcohol abuse Maternal Uncle    Drug abuse Maternal Uncle     Social History:  Social History   Socioeconomic History   Marital status: Single    Spouse name: Not on file   Number of children: 2   Years of education: 14   Highest education level: Not on file  Occupational History   Not on file  Tobacco Use   Smoking status: Every Day    Current packs/day: 0.00    Types: Cigarettes    Last attempt to quit: 08/29/2016    Years since quitting: 7.7   Smokeless tobacco: Never  Vaping Use   Vaping status: Never Used  Substance and Sexual Activity   Alcohol use: No   Drug use: Not Currently    Types: Marijuana   Sexual activity: Never    Birth control/protection: Surgical  Other Topics Concern   Not on file  Social History Narrative   Pt lives with her son in [place]. Pt works at [employer]. Born and raised in [place] by mom until 7 then by grandparents. Pt has one older brother. Pt is at [employer] and studying medical billing and coding. Never married and has 2 kids.    Social Drivers  of Corporate investment banker Strain: Not on file  Food Insecurity: No Food Insecurity (01/05/2024)   Hunger Vital Sign    Worried About Running Out of Food in the Last Year: Never true    Ran Out of Food in the Last Year: Never true  Transportation Needs: No Transportation Needs (01/05/2024)   PRAPARE - Administrator, Civil Service (Medical): No    Lack of Transportation (Non-Medical): No  Physical Activity: Not on file  Stress: Not on file  Social Connections: Unknown (01/05/2024)   Social Connection and Isolation Panel    Frequency of Communication with Friends and Family: More than three times a week    Frequency of Social Gatherings with Friends and Family: More than three times a week    Attends Religious Services: 1 to 4 times per year    Active Member of Clubs or Organizations: No    Attends Banker Meetings: Never    Marital Status: Patient declined    Allergies:  No Known Allergies   Metabolic Disorder Labs: Lab Results  Component Value Date   HGBA1C 6.2 (H) 01/05/2024   MPG 131 01/05/2024  No results found for: PROLACTIN Lab Results  Component Value Date   CHOL 142 01/05/2024   TRIG 102 01/05/2024   HDL 52 01/05/2024   CHOLHDL 2.7 01/05/2024   VLDL 20 01/05/2024   LDLCALC 70 01/05/2024   LDLCALC 58 11/13/2023   Lab Results  Component Value Date   TSH 0.784 01/04/2024   TSH 0.643 01/14/2020    Therapeutic Level Labs: No results found for: LITHIUM No results found for: VALPROATE No results found for: CBMZ  Current Medications: Current Outpatient Medications  Medication Sig Dispense Refill   ALPRAZolam  (XANAX ) 1 MG tablet Take 1 tablet (1 mg total) by mouth 3 (three) times daily. 90 tablet 4   buPROPion  (WELLBUTRIN  XL) 150 MG 24 hr tablet 3 qam 90 tablet 4   Cholecalciferol  25 MCG (1000 UT) tablet Take 1,000 Units by mouth daily.     diclofenac  sodium (VOLTAREN ) 1 % GEL Apply 2 g topically 4 (four) times daily. 100 g 0    divalproex  (DEPAKOTE  ER) 250 MG 24 hr tablet 1 at bedtime for 3 days then 2  qhs 60 tablet 4   doxycycline  (VIBRAMYCIN ) 100 MG capsule Take 1 capsule (100 mg total) by mouth 2 (two) times daily. 20 capsule 0   fluticasone  (FLONASE ) 50 MCG/ACT nasal spray Place 1 spray into both nostrils daily. 1 g 0   hydrochlorothiazide  (HYDRODIURIL ) 25 MG tablet Take 1 tablet (25 mg total) by mouth daily. 90 tablet 2   meloxicam (MOBIC) 15 MG tablet Take 15 mg by mouth daily.     metoprolol  succinate (TOPROL -XL) 50 MG 24 hr tablet Take 1 tablet by mouth once daily 90 tablet 0   nitroGLYCERIN  (NITRO-DUR ) 0.2 mg/hr patch Place 1 patch (0.2 mg total) onto the skin daily. 30 patch 12   OZEMPIC , 0.25 OR 0.5 MG/DOSE, 2 MG/3ML SOPN INJECT 0.5 MG INTO THE SKIN EVERY 7 DAYS 3 mL 0   Potassium Chloride  ER 20 MEQ TBCR Take 1 tablet (20 mEq total) by mouth daily. 60 tablet 3   QUEtiapine  (SEROQUEL  XR) 200 MG 24 hr tablet 2 qhs 60 tablet 4   rosuvastatin  (CRESTOR ) 10 MG tablet Take 1 tablet (10 mg total) by mouth daily. 90 tablet 2   No current facility-administered medications for this visit.      Psychiatric Specialty Exam: Review of Systems  Psychiatric/Behavioral:  The patient is nervous/anxious.   All other systems reviewed and are negative.   Last menstrual period 10/12/2011.There is no height or weight on file to calculate BMI.  General Appearance: NA  Eye Contact:  NA  Speech:  Clear and Coherent and Normal Rate  Volume:  Normal  Mood:  Anxious  Affect:  NA  Thought Process:  Goal Directed and Linear  Orientation:  Full (Time, Place, and Person)  Thought Content: Logical   Suicidal Thoughts:  No  Homicidal Thoughts:  No  Memory:  Immediate;   Good Recent;   Good Remote;   Good  Judgement:  Good  Insight:  Good  Psychomotor Activity:  NA  Concentration:  Concentration: Good  Recall:  Good  Fund of Knowledge: Good  Language: Good  Akathisia:  Negative  Handed:  Right  AIMS (if indicated):  not done  Assets:  Communication Skills Desire for Improvement Financial Resources/Insurance Housing Resilience Talents/Skills  ADL's:  Intact  Cognition: WNL  Sleep:  Good   Screenings: AIMS    Flowsheet Row [facility]  AIMS  Total Score 0   GAD-7    Flowsheet Row Office Visit from 02/04/2024 in [facility] Office Visit from 11/13/2023 in [facility] Counselor from 02/12/2021 in [facility] at [facility]  Total GAD-7 Score 0 0 17   PHQ2-9    Flowsheet Row Office Visit from 02/04/2024 in [facility] Office Visit from 11[facility]13/2023 in [facility] Counselor from 02/12/2021 in [facility] at [facility] Total Score 0 0 2  PHQ-9 Total Score 0 0 10   Flowsheet Row ED from 02/27/2024 in [facility] Emergency Department at [facility] ED to Hosp-Admission (Discharged) from 01/04/2024 in [place] [facility] ED from 11/18/2023 in [facility] Emergency Department at [facility]  C-SSRS RISK CATEGORY No Risk No Risk No Risk     Assessment and Plan:      At this time given the patient's cardiovascular risks we will go ahead and reduce her Seroquel  from 600 mg down to 400 mg.  Her next visit we will attempt to lower even down to 200 mg.  She will continue taking Xanax  1 mg 3 times daily, Depakote  200 mg 1 twice daily and continue her Wellbutrin .  The patient is doing fairly well.  Hopefully she will get a new job and get paid better.  She will return to see me in 3 months. 11/14/2020, 3:55 PM

## 2024-06-09 ENCOUNTER — Ambulatory Visit: Payer: Self-pay | Admitting: Family Medicine

## 2024-06-09 ENCOUNTER — Encounter: Payer: Self-pay | Admitting: Family Medicine

## 2024-06-09 VITALS — BP 112/70 | HR 77 | Temp 98.2°F | Ht 67.0 in | Wt 212.0 lb

## 2024-06-09 DIAGNOSIS — Z23 Encounter for immunization: Secondary | ICD-10-CM | POA: Diagnosis not present

## 2024-06-09 DIAGNOSIS — I10 Essential (primary) hypertension: Secondary | ICD-10-CM | POA: Diagnosis not present

## 2024-06-09 DIAGNOSIS — Z1231 Encounter for screening mammogram for malignant neoplasm of breast: Secondary | ICD-10-CM

## 2024-06-09 DIAGNOSIS — E559 Vitamin D deficiency, unspecified: Secondary | ICD-10-CM | POA: Diagnosis not present

## 2024-06-09 DIAGNOSIS — E785 Hyperlipidemia, unspecified: Secondary | ICD-10-CM

## 2024-06-09 DIAGNOSIS — Z1159 Encounter for screening for other viral diseases: Secondary | ICD-10-CM | POA: Diagnosis not present

## 2024-06-09 DIAGNOSIS — Z0000 Encounter for general adult medical examination without abnormal findings: Secondary | ICD-10-CM | POA: Diagnosis not present

## 2024-06-09 DIAGNOSIS — F17200 Nicotine dependence, unspecified, uncomplicated: Secondary | ICD-10-CM | POA: Diagnosis not present

## 2024-06-09 DIAGNOSIS — E669 Obesity, unspecified: Secondary | ICD-10-CM | POA: Diagnosis not present

## 2024-06-09 DIAGNOSIS — E1169 Type 2 diabetes mellitus with other specified complication: Secondary | ICD-10-CM | POA: Diagnosis not present

## 2024-06-09 DIAGNOSIS — Z1211 Encounter for screening for malignant neoplasm of colon: Secondary | ICD-10-CM

## 2024-06-09 NOTE — Patient Instructions (Signed)
 Hypertension, Adult Hypertension is another name for high blood pressure. High blood pressure forces your heart to work harder to pump blood. This can cause problems over time. There are two numbers in a blood pressure reading. There is a top number (systolic) over a bottom number (diastolic). It is best to have a blood pressure that is below 120/80. What are the causes? The cause of this condition is not known. Some other conditions can lead to high blood pressure. What increases the risk? Some lifestyle factors can make you more likely to develop high blood pressure: Smoking. Not getting enough exercise or physical activity. Being overweight. Having too much fat, sugar, calories, or salt (sodium) in your diet. Drinking too much alcohol. Other risk factors include: Having any of these conditions: Heart disease. Diabetes. High cholesterol. Kidney disease. Obstructive sleep apnea. Having a family history of high blood pressure and high cholesterol. Age. The risk increases with age. Stress. What are the signs or symptoms? High blood pressure may not cause symptoms. Very high blood pressure (hypertensive crisis) may cause: Headache. Fast or uneven heartbeats (palpitations). Shortness of breath. Nosebleed. Vomiting or feeling like you may vomit (nauseous). Changes in how you see. Very bad chest pain. Feeling dizzy. Seizures. How is this treated? This condition is treated by making healthy lifestyle changes, such as: Eating healthy foods. Exercising more. Drinking less alcohol. Your doctor may prescribe medicine if lifestyle changes do not help enough and if: Your top number is above 130. Your bottom number is above 80. Your personal target blood pressure may vary. Follow these instructions at home: Eating and drinking  If told, follow the DASH eating plan. To follow this plan: Fill one half of your plate at each meal with fruits and vegetables. Fill one fourth of your plate  at each meal with whole grains. Whole grains include whole-wheat pasta, brown rice, and whole-grain bread. Eat or drink low-fat dairy products, such as skim milk or low-fat yogurt. Fill one fourth of your plate at each meal with low-fat (lean) proteins. Low-fat proteins include fish, chicken without skin, eggs, beans, and tofu. Avoid fatty meat, cured and processed meat, or chicken with skin. Avoid pre-made or processed food. Limit the amount of salt in your diet to less than 1,500 mg each day. Do not drink alcohol if: Your doctor tells you not to drink. You are pregnant, may be pregnant, or are planning to become pregnant. If you drink alcohol: Limit how much you have to: 0-1 drink a day for women. 0-2 drinks a day for men. Know how much alcohol is in your drink. In the [place] one drink equals one 12 oz bottle of beer (355 mL), one 5 oz glass of wine (148 mL), or one 1 oz glass of hard liquor (44 mL). Lifestyle  Work with your doctor to stay at a healthy weight or to lose weight. Ask your doctor what the best weight is for you. Get at least 30 minutes of exercise that causes your heart to beat faster (aerobic exercise) most days of the week. This may include walking, swimming, or biking. Get at least 30 minutes of exercise that strengthens your muscles (resistance exercise) at least 3 days a week. This may include lifting weights or doing Pilates. Do not smoke or use any products that contain nicotine or tobacco. If you need help quitting, ask your doctor. Check your blood pressure at home as told by your doctor. Keep all follow-up visits. Medicines Take over-the-counter and prescription medicines  only as told by your doctor. Follow directions carefully. Do not skip doses of blood pressure medicine. The medicine does not work as well if you skip doses. Skipping doses also puts you at risk for problems. Ask your doctor about side effects or reactions to medicines that you should watch  for. Contact a doctor if: You think you are having a reaction to the medicine you are taking. You have headaches that keep coming back. You feel dizzy. You have swelling in your ankles. You have trouble with your vision. Get help right away if: You get a very bad headache. You start to feel mixed up (confused). You feel weak or numb. You feel faint. You have very bad pain in your: Chest. Belly (abdomen). You vomit more than once. You have trouble breathing. These symptoms may be an emergency. Get help right away. Call 911. Do not wait to see if the symptoms will go away. Do not drive yourself to the hospital. Summary Hypertension is another name for high blood pressure. High blood pressure forces your heart to work harder to pump blood. For most people, a normal blood pressure is less than 120/80. Making healthy choices can help lower blood pressure. If your blood pressure does not get lower with healthy choices, you may need to take medicine. This information is not intended to replace advice given to you by your health care provider. Make sure you discuss any questions you have with your health care provider. Document Revised: 05/03/2021 Document Reviewed: 05/03/2021 Elsevier Patient Education  2024 [employer].

## 2024-06-09 NOTE — Progress Notes (Signed)
 I,Jameka J Llittleton, CMA,acting as a neurosurgeon for [employer], NP.,have documented all relevant documentation on the behalf of Jill Creighton, NP,as directed by  Jill Creighton, NP while in the presence of Jill Creighton, NP.  Subjective:  Patient ID: Jill [name] , female    DOB: August 26, 1965 , 58 y.o.   MRN: [id number]  Chief Complaint  Patient presents with   Hypertension    Patient presents today for a bpc. Patient reports compliance with her meds. Patient denies having chest pain, sob or headaches at this time.    Diabetes    HPI Discussed the use of AI scribe software for clinical note transcription with the patient, who gave verbal consent to proceed.  History of Present Illness      Jill [name] is a 58 year old female with diabetes and hypertension who presents for her annual physical visit today .  She is adherent to her medication regimen, managing her diabetes with Ozempic , which has resulted in decreased appetite and weight loss. She takes Rosuvastatin  for cholesterol management. Despite the decreased appetite, she maintains regular meals, typically having a half cup of coffee and four crackers for breakfast.  She has significantly reduced her smoking habit, now smoking only one or two cigarettes at work and at home. She previously used Chantix but discontinued it due to adverse effects, including vivid dreams. She is currently using Wellbutrin , which she finds helpful in reducing nicotine  cravings.  She recalls a recent hospitalization due to severe memory issues, where she was unable to recall her name or address. Her children assisted her during this time, and her memory improved after about a week.  She has a history of hidradenitis suppurativa, with recurrent lesions under her arms. A recent episode required drainage and treatment with doxycycline . She currently has a lesion that is self-draining.  She has not had a colonoscopy. She finds mammograms uncomfortable but  acknowledges their importance. She has not seen a gynecologist recently and is not sexually active. She has not had an eye exam in two years due to lack of insurance, despite understanding the importance of regular eye exams given her diabetes and hypertension.  She confirms regular bowel movements. No chest pain or ankle pain when walking. She has received her flu shot and does not take a multivitamin.      Past Medical History:  Diagnosis Date   Anxiety    Bipolar 1 disorder (HCC)    Hypertension    Post-traumatic stress syndrome      Family History  Problem Relation Age of Onset   Drug abuse Mother    Alcohol abuse Maternal Uncle    Drug abuse Maternal Uncle      Current Outpatient Medications:    ALPRAZolam  (XANAX ) 1 MG tablet, Take 1 tablet (1 mg total) by mouth 3 (three) times daily., Disp: 90 tablet, Rfl: 4   buPROPion  (WELLBUTRIN  XL) 150 MG 24 hr tablet, 3 qam, Disp: 90 tablet, Rfl: 4   Cholecalciferol  25 MCG (1000 UT) tablet, Take 1,000 Units by mouth daily., Disp: , Rfl:    diclofenac  sodium (VOLTAREN ) 1 % GEL, Apply 2 g topically 4 (four) times daily., Disp: 100 g, Rfl: 0   divalproex  (DEPAKOTE  ER) 250 MG 24 hr tablet, 1 at bedtime for 3 days then 2  qhs, Disp: 60 tablet, Rfl: 4   fluticasone  (FLONASE ) 50 MCG/ACT nasal spray, Place 1 spray into both nostrils daily., Disp: 1 g, Rfl: 0   hydrochlorothiazide  (HYDRODIURIL ) 25  MG tablet, Take 1 tablet (25 mg total) by mouth daily., Disp: 90 tablet, Rfl: 2   meloxicam (MOBIC) 15 MG tablet, Take 15 mg by mouth daily., Disp: , Rfl:    nitroGLYCERIN  (NITRO-DUR ) 0.2 mg/hr patch, Place 1 patch (0.2 mg total) onto the skin daily., Disp: 30 patch, Rfl: 12   OZEMPIC , 0.25 OR 0.5 MG/DOSE, 2 MG/3ML SOPN, INJECT 0.5 MG INTO THE SKIN EVERY 7 DAYS, Disp: 3 mL, Rfl: 0   rosuvastatin  (CRESTOR ) 10 MG tablet, Take 1 tablet (10 mg total) by mouth daily., Disp: 90 tablet, Rfl: 2   metoprolol  succinate (TOPROL -XL) 50 MG 24 hr tablet, Take 1  tablet (50 mg total) by mouth daily., Disp: 90 tablet, Rfl: 0   Potassium Chloride  ER 20 MEQ TBCR, Take 1 tablet (20 mEq total) by mouth daily., Disp: 60 tablet, Rfl: 3   QUEtiapine  (SEROQUEL  XR) 400 MG 24 hr tablet, Take 1 tablet at bedtime., Disp: 30 tablet, Rfl: 4   No Known Allergies   Review of Systems  Constitutional: Negative.   Eyes: Negative.   Respiratory: Negative.    Cardiovascular: Negative.   Musculoskeletal: Negative.   Skin: Negative.   Psychiatric/Behavioral: Negative.       Today's Vitals   06/09/24 1505  BP: 112/70  Pulse: 77  Temp: 98.2 F (36.8 C)  TempSrc: Oral  Weight: 212 lb (96.2 kg)  Height: 5' 7 (1.702 m)  PainSc: 0-No pain   Body mass index is 33.2 kg/m.  Wt Readings from Last 3 Encounters:  06/09/24 212 lb (96.2 kg)  02/27/24 225 lb (102.1 kg)  02/04/24 225 lb (102.1 kg)    The ASCVD Risk score (Arnett DK, et al., 2019) failed to calculate for the following reasons:   The valid total cholesterol range is 130 to 320 mg/dL  Objective:  Physical Exam Constitutional:      Appearance: Normal appearance.  HENT:     Head: Normocephalic.     Nose: Nose normal.  Cardiovascular:     Rate and Rhythm: Normal rate and regular rhythm.     Pulses: Normal pulses.     Heart sounds: Normal heart sounds.  Pulmonary:     Effort: Pulmonary effort is normal.     Breath sounds: Normal breath sounds.  Abdominal:     General: Bowel sounds are normal.  Musculoskeletal:        General: Normal range of motion.  Skin:    General: Skin is warm and dry.  Neurological:     General: No focal deficit present.     Mental Status: She is alert and oriented to person, place, and time. Mental status is at baseline.  Psychiatric:        Mood and Affect: Mood normal.         Assessment And Plan:   Assessment & Plan Dyslipidemia due to type 2 diabetes mellitus (HCC) Continue rosuvastatin  10 mg every day  Type 2 diabetes mellitus in patient with obesity  (HCC) Continue ozempic  injection once weekly Vitamin D  deficiency Check lab Primary hypertension Continue hydrochlorothiazide  and metoprolol  Need for influenza vaccination Get influenza vaccine Screening for colon cancer Refer to [facility] Screening mammogram for breast cancer Order mammogram Need for hepatitis B screening test Check labs Tobacco dependence Smoking cessation counseling given Adult general medical examination   Orders Placed This Encounter  Procedures   Flu vaccine trivalent PF, 6mos and older(Flulaval,Afluria,Fluarix,Fluzone)   CBC   CMP14+EGFR   Hemoglobin A1c   Lipid panel  Vitamin D  (25 hydroxy)   Hepatitis B Surface Antibody   Microalbumin / creatinine urine ratio   Ambulatory referral to Gastroenterology   Ambulatory referral to Ophthalmology     Return for controlled DM check-4 months, physical in 1 year.  Patient was given opportunity to ask questions. Patient verbalized understanding of the plan and was able to repeat key elements of the plan. All questions were answered to their satisfaction.    I, Jill Creighton, NP, have reviewed all documentation for this visit. The documentation on 06/20/2024 for the exam, diagnosis, procedures, and orders are all accurate and complete.   IF YOU HAVE BEEN REFERRED TO A SPECIALIST, IT MAY TAKE 1-2 WEEKS TO SCHEDULE/PROCESS THE REFERRAL. IF YOU HAVE NOT HEARD FROM US /SPECIALIST IN TWO WEEKS, PLEASE GIVE US  A CALL AT [phone].

## 2024-06-10 LAB — LIPID PANEL
Chol/HDL Ratio: 1.9 ratio (ref 0.0–4.4)
Cholesterol, Total: 102 mg/dL (ref 100–199)
HDL: 55 mg/dL (ref >39)
LDL Chol Calc (NIH): 32 mg/dL (ref 0–99)
Triglycerides: 67 mg/dL (ref 0–149)
VLDL Cholesterol Cal: 15 mg/dL (ref 5–40)

## 2024-06-10 LAB — VITAMIN D 25 HYDROXY (VIT D DEFICIENCY, FRACTURES): Vit D, 25-Hydroxy: 45.6 ng/mL (ref 30.0–100.0)

## 2024-06-10 LAB — CMP14+EGFR
ALT: 19 IU/L (ref 0–32)
AST: 18 IU/L (ref 0–40)
Albumin: 4.2 g/dL (ref 3.8–4.9)
Alkaline Phosphatase: 77 IU/L (ref 49–135)
BUN/Creatinine Ratio: 16 (ref 9–23)
BUN: 13 mg/dL (ref 6–24)
Bilirubin Total: 0.2 mg/dL (ref 0.0–1.2)
CO2: 23 mmol/L (ref 20–29)
Calcium: 9.5 mg/dL (ref 8.7–10.2)
Chloride: 103 mmol/L (ref 96–106)
Creatinine, Ser: 0.79 mg/dL (ref 0.57–1.00)
Globulin, Total: 2.3 g/dL (ref 1.5–4.5)
Glucose: 80 mg/dL (ref 70–99)
Potassium: 4.1 mmol/L (ref 3.5–5.2)
Sodium: 142 mmol/L (ref 134–144)
Total Protein: 6.5 g/dL (ref 6.0–8.5)
eGFR: 86 mL/min/1.73 (ref >59)

## 2024-06-10 LAB — HEMOGLOBIN A1C
Est. average glucose Bld gHb Est-mCnc: 117 mg/dL
Hgb A1c MFr Bld: 5.7 % — ABNORMAL HIGH (ref 4.8–5.6)

## 2024-06-10 LAB — HEPATITIS B SURFACE ANTIBODY,QUALITATIVE

## 2024-06-10 LAB — CBC
Hematocrit: 37.6 % (ref 34.0–46.6)
Hemoglobin: 12 g/dL (ref 11.1–15.9)
MCH: 30.7 pg (ref 26.6–33.0)
MCHC: 31.9 g/dL (ref 31.5–35.7)
MCV: 96 fL (ref 79–97)
Platelets: 264 x10E3/uL (ref 150–450)
RBC: 3.91 x10E6/uL (ref 3.77–5.28)
RDW: 13.4 % (ref 11.7–15.4)
WBC: 8.4 x10E3/uL (ref 3.4–10.8)

## 2024-06-10 LAB — MICROALBUMIN / CREATININE URINE RATIO
Creatinine, Urine: 332.7 mg/dL
Microalb/Creat Ratio: 3 mg/g{creat} (ref 0–29)
Microalbumin, Urine: 9 ug/mL

## 2024-06-16 ENCOUNTER — Other Ambulatory Visit: Payer: Self-pay | Admitting: Family Medicine

## 2024-06-17 ENCOUNTER — Other Ambulatory Visit (HOSPITAL_COMMUNITY): Payer: Self-pay | Admitting: *Deleted

## 2024-06-17 ENCOUNTER — Other Ambulatory Visit: Payer: Self-pay | Admitting: Family Medicine

## 2024-06-17 DIAGNOSIS — E876 Hypokalemia: Secondary | ICD-10-CM

## 2024-06-17 MED ORDER — POTASSIUM CHLORIDE ER 20 MEQ PO TBCR: 20.0000 meq | EXTENDED_RELEASE_TABLET | Freq: Every day | ORAL | 3 refills | Status: AC

## 2024-06-17 MED ORDER — METOPROLOL SUCCINATE ER 50 MG PO TB24: 50.0000 mg | ORAL_TABLET | Freq: Every day | ORAL | 0 refills | Status: DC

## 2024-06-17 MED ORDER — QUETIAPINE FUMARATE ER 400 MG PO TB24: ORAL_TABLET | ORAL | 4 refills | Status: DC

## 2024-06-17 NOTE — Telephone Encounter (Signed)
 Copied from CRM #[id number]. Topic: Clinical - Medication Refill >> Jun 17, 2024  2:14 PM Darshell M wrote: Medication: [phone]  Has the patient contacted their pharmacy? Yes (Agent: If no, request that the patient contact the pharmacy for the refill. If patient does not wish to contact the pharmacy document the reason why and proceed with request.) (Agent: If yes, when and what did the pharmacy advise?)  This is the patient's preferred pharmacy:  [facility] [address] ([address]), [place] - [address] [place] ([place]) [place] [address] Phone: [phone] Fax: [phone]  Is this the correct pharmacy for this prescription? Yes If no, delete pharmacy and type the correct one.   Has the prescription been filled recently? No  Is the patient out of the medication? Yes  Has the patient been seen for an appointment in the last year OR does the patient have an upcoming appointment? Yes  Can we respond through MyChart? Yes  Agent: Please be advised that Rx refills may take up to 3 business days. We ask that you follow-up with your pharmacy.

## 2024-06-20 ENCOUNTER — Ambulatory Visit: Payer: Self-pay | Admitting: Family Medicine

## 2024-06-20 DIAGNOSIS — F17200 Nicotine dependence, unspecified, uncomplicated: Secondary | ICD-10-CM | POA: Insufficient documentation

## 2024-06-20 DIAGNOSIS — Z23 Encounter for immunization: Secondary | ICD-10-CM | POA: Insufficient documentation

## 2024-06-20 DIAGNOSIS — Z0000 Encounter for general adult medical examination without abnormal findings: Secondary | ICD-10-CM | POA: Insufficient documentation

## 2024-06-20 DIAGNOSIS — Z1159 Encounter for screening for other viral diseases: Secondary | ICD-10-CM | POA: Insufficient documentation

## 2024-06-20 DIAGNOSIS — E559 Vitamin D deficiency, unspecified: Secondary | ICD-10-CM | POA: Insufficient documentation

## 2024-06-20 DIAGNOSIS — Z1211 Encounter for screening for malignant neoplasm of colon: Secondary | ICD-10-CM | POA: Insufficient documentation

## 2024-06-20 NOTE — Assessment & Plan Note (Signed)
-   Continue hydrochlorothiazide and metoprolol 

## 2024-06-20 NOTE — Assessment & Plan Note (Signed)
 Continue ozempic  injection once weekly

## 2024-06-20 NOTE — Progress Notes (Signed)
 Your A1c dropped to 5.7 from 6.2, this is very good. Keep up your good job goal is less than 7. Your urine and blood does not indicate any issues with your kidneys.  Your blood count and cholesterol levels are very good. Keep taking your medications as prescribed.  Thank you!

## 2024-06-20 NOTE — Assessment & Plan Note (Signed)
 Check labs

## 2024-06-20 NOTE — Assessment & Plan Note (Signed)
Get influenza vaccine  

## 2024-06-20 NOTE — Assessment & Plan Note (Signed)
 Order mammogram

## 2024-06-20 NOTE — Assessment & Plan Note (Signed)
 Check lab

## 2024-06-20 NOTE — Assessment & Plan Note (Signed)
 Continue rosuvastatin  10 mg every day

## 2024-06-20 NOTE — Assessment & Plan Note (Signed)
 Refer to GI

## 2024-06-20 NOTE — Assessment & Plan Note (Signed)
Smoking cessation counseling given

## 2024-06-23 ENCOUNTER — Encounter: Payer: Self-pay | Admitting: Family Medicine

## 2024-07-07 ENCOUNTER — Other Ambulatory Visit: Payer: Self-pay | Admitting: Family Medicine

## 2024-07-07 DIAGNOSIS — E1165 Type 2 diabetes mellitus with hyperglycemia: Secondary | ICD-10-CM

## 2024-07-08 ENCOUNTER — Other Ambulatory Visit: Payer: Self-pay | Admitting: Family Medicine

## 2024-07-08 DIAGNOSIS — E1165 Type 2 diabetes mellitus with hyperglycemia: Secondary | ICD-10-CM

## 2024-07-08 NOTE — Telephone Encounter (Unsigned)
 Copied from CRM #[id number]. Topic: Clinical - Medication Refill >> Jul 08, 2024 11:54 AM [place] MATSU wrote: Medication: OZEMPIC , 0.5 MG/DOSE,  Has the patient contacted their pharmacy? Yes (Agent: If no, request that the patient contact the pharmacy for the refill. If patient does not wish to contact the pharmacy document the reason why and proceed with request.) (Agent: If yes, when and what did the pharmacy advise?)  This is the patient's preferred pharmacy:  [facility] [address] ([address]), [place] - [address] [place] ([place]) [place] [address] Phone: [phone] Fax: [phone]  Is this the correct pharmacy for this prescription? Yes If no, delete pharmacy and type the correct one.   Has the prescription been filled recently? Yes  Is the patient out of the medication? Yes  Has the patient been seen for an appointment in the last year OR does the patient have an upcoming appointment? Yes  Can we respond through MyChart? Yes  Agent: Please be advised that Rx refills may take up to 3 business days. We ask that you follow-up with your pharmacy.

## 2024-07-09 ENCOUNTER — Other Ambulatory Visit (HOSPITAL_COMMUNITY): Payer: Self-pay | Admitting: Psychiatry

## 2024-07-13 ENCOUNTER — Other Ambulatory Visit (HOSPITAL_COMMUNITY): Payer: Self-pay | Admitting: Psychiatry

## 2024-07-13 MED ORDER — HYDROXYZINE PAMOATE 25 MG PO CAPS: ORAL_CAPSULE | ORAL | 3 refills | Status: AC

## 2024-07-21 ENCOUNTER — Other Ambulatory Visit (HOSPITAL_COMMUNITY): Payer: Self-pay

## 2024-08-02 DIAGNOSIS — E1165 Type 2 diabetes mellitus with hyperglycemia: Secondary | ICD-10-CM

## 2024-08-06 ENCOUNTER — Other Ambulatory Visit: Payer: Self-pay | Admitting: Family Medicine

## 2024-08-24 ENCOUNTER — Encounter: Payer: Self-pay | Admitting: Gastroenterology

## 2024-08-24 ENCOUNTER — Ambulatory Visit (HOSPITAL_COMMUNITY): Admitting: Psychiatry

## 2024-08-24 VITALS — BP 134/90 | HR 97 | Resp 18 | Ht 67.0 in | Wt 196.2 lb

## 2024-08-24 DIAGNOSIS — F3131 Bipolar disorder, current episode depressed, mild: Secondary | ICD-10-CM | POA: Diagnosis not present

## 2024-08-24 MED ORDER — QUETIAPINE FUMARATE ER 300 MG PO TB24: ORAL_TABLET | ORAL | 5 refills | Status: AC

## 2024-08-24 MED ORDER — BUPROPION HCL ER (XL) 150 MG PO TB24: ORAL_TABLET | ORAL | 4 refills | Status: AC

## 2024-08-24 MED ORDER — ALPRAZOLAM 1 MG PO TABS: 1.0000 mg | ORAL_TABLET | Freq: Three times a day (TID) | ORAL | 4 refills | Status: AC

## 2024-08-24 NOTE — Progress Notes (Signed)
 [facility] MD/PA/NP OP Progress Note  11/14/2020 3:58 PM Jill [name]  MRN:  [id number]       Today the patient is doing very well.  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  Apparently that will give her higher weight and she will make more money.  Nonetheless she is increased her income by making approximately $13 an hour to a new level of $18 an hour.  She is very happy with making more money.  She generally likes what she does she likes the people okay and her supervisor is okay.  She believes it is a safe job.  The patient did reduce her Seroquel  down to 400 but she notices a difference.  She says she feels more like she is ready to snap more irritable and wishes to go back to 600.  The patient continues to lose weight.  She is wanting medication to lose weight she is now down to 192 pounds.  She takes all the medications as prescribed.  She still has an appetite and she is sleeping well.  She lives with her daughter.  Her daughter works from home.  The patient has no grandchildren.  Patient has no romantic relationships.  Overall she is doing well. No diagnosis found.  Past Psychiatric History: Please see intake H&P.  Past Medical History:  Past Medical History:  Diagnosis Date   Anxiety    Bipolar 1 disorder (HCC)    Hypertension    Post-traumatic stress syndrome     Past Surgical History:  Procedure Laterality Date   BLADDER SURGERY     CHOLECYSTECTOMY     TUBAL LIGATION      Family Psychiatric History: Reviewed.  Family History:  Family History  Problem Relation Age of Onset   Drug abuse Mother    Alcohol abuse Maternal Uncle    Drug abuse Maternal Uncle     Social History:  Social History   Socioeconomic History   Marital status: Single    Spouse name: Not on file   Number of children: 2   Years of education: 14   Highest education level: Not on file  Occupational History   Not on file   Tobacco Use   Smoking status: Every Day    Current packs/day: 0.00    Average packs/day: 0.5 packs/day    Types: Cigarettes    Last attempt to quit: 08/29/2016    Years since quitting: 7.9   Smokeless tobacco: Never  Vaping Use   Vaping status: Never Used  Substance and Sexual Activity   Alcohol use: No   Drug use: Not Currently    Types: Marijuana   Sexual activity: Never    Birth control/protection: Surgical  Other Topics Concern   Not on file  Social History Narrative   Pt lives with her son in [place]. Pt works at [employer]. Born and raised in [place] by mom until 7 then by grandparents. Pt has one older brother. Pt is at [employer] and studying medical billing and coding. Never married and has 2 kids.    Social Drivers of Health   Tobacco Use: High Risk (06/09/2024)   Patient History    Smoking Tobacco Use: Every Day    Smokeless Tobacco Use: Never    Passive Exposure: Not on file  Financial Resource Strain: Not on file  Food Insecurity: No Food Insecurity (01/05/2024)   Hunger  Vital Sign    Worried About Programme Researcher, Broadcasting/film/video in the Last Year: Never true    Ran Out of Food in the Last Year: Never true  Transportation Needs: No Transportation Needs (01/05/2024)   PRAPARE - Administrator, Civil Service (Medical): No    Lack of Transportation (Non-Medical): No  Physical Activity: Not on file  Stress: Not on file  Social Connections: Unknown (01/05/2024)   Social Connection and Isolation Panel    Frequency of Communication with Friends and Family: More than three times a week    Frequency of Social Gatherings with Friends and Family: More than three times a week    Attends Religious Services: 1 to 4 times per year    Active Member of [employer] or Organizations: No    Attends Banker Meetings: Never    Marital Status: Patient declined  Depression (PHQ2-9): Low Risk (06/09/2024)   Depression (PHQ2-9)    PHQ-2 Score: 4  Alcohol Screen: Not on file   Housing: Low Risk (01/05/2024)   Housing Stability Vital Sign    Unable to Pay for Housing in the Last Year: No    Number of Times Moved in the Last Year: 0    Homeless in the Last Year: No  Utilities: Not At Risk (01/05/2024)   AHC Utilities    Threatened with loss of utilities: No  Health Literacy: Not on file    Allergies:  No Known Allergies   Metabolic Disorder Labs: Lab Results  Component Value Date   HGBA1C 5.7 (H) 06/09/2024   MPG 131 01/05/2024   No results found for: PROLACTIN Lab Results  Component Value Date   CHOL 102 06/09/2024   TRIG 67 06/09/2024   HDL 55 06/09/2024   CHOLHDL 1.9 06/09/2024   VLDL 20 01/05/2024   LDLCALC 32 06/09/2024   LDLCALC 70 01/05/2024   Lab Results  Component Value Date   TSH 0.784 01/04/2024   TSH 0.643 01/14/2020    Therapeutic Level Labs: No results found for: LITHIUM No results found for: VALPROATE No results found for: CBMZ  Current Medications: Current Outpatient Medications  Medication Sig Dispense Refill   Cholecalciferol  25 MCG (1000 UT) tablet Take 1,000 Units by mouth daily.     diclofenac  sodium (VOLTAREN ) 1 % GEL Apply 2 g topically 4 (four) times daily. 100 g 0   divalproex  (DEPAKOTE  ER) 250 MG 24 hr tablet 1 at bedtime for 3 days then 2  qhs 60 tablet 4   fluticasone  (FLONASE ) 50 MCG/ACT nasal spray Place 1 spray into both nostrils daily. 1 g 0   hydrochlorothiazide  (HYDRODIURIL ) 25 MG tablet Take 1 tablet (25 mg total) by mouth daily. 90 tablet 2   hydrOXYzine  (VISTARIL ) 25 MG capsule 2 qhs 60 capsule 3   meloxicam (MOBIC) 15 MG tablet Take 15 mg by mouth daily.     metoprolol  succinate (TOPROL -XL) 50 MG 24 hr tablet Take 1 tablet by mouth once daily 90 tablet 0   nitroGLYCERIN  (NITRO-DUR ) 0.2 mg/hr patch Place 1 patch (0.2 mg total) onto the skin daily. 30 patch 12   OZEMPIC , 0.25 OR 0.5 MG/DOSE, 2 MG/3ML SOPN INJECT 0.5MG  INTO THE SKIN EVERY 7 DAYS 3 mL 0   Potassium Chloride  ER 20 MEQ TBCR Take 1  tablet (20 mEq total) by mouth daily. 60 tablet 3   rosuvastatin  (CRESTOR ) 10 MG tablet Take 1 tablet (10 mg total) by mouth daily. 90 tablet 2   ALPRAZolam  (XANAX )  1 MG tablet Take 1 tablet (1 mg total) by mouth 3 (three) times daily. 90 tablet 4   buPROPion  (WELLBUTRIN  XL) 150 MG 24 hr tablet 3 qam 90 tablet 4   QUEtiapine  (SEROQUEL  XR) 300 MG 24 hr tablet Take 2 tablet at bedtime. 60 tablet 5   No current facility-administered medications for this visit.      Psychiatric Specialty Exam: Review of Systems  Psychiatric/Behavioral:  The patient is nervous/anxious.   All other systems reviewed and are negative.   Blood pressure (!) 134/90, pulse 97, resp. rate 18, height 5' 7 (1.702 m), weight 196 lb 3.2 oz (89 kg), last menstrual period 10/12/2011.Body mass index is 30.73 kg/m.  General Appearance: NA  Eye Contact:  NA  Speech:  Clear and Coherent and Normal Rate  Volume:  Normal  Mood:  Anxious  Affect:  NA  Thought Process:  Goal Directed and Linear  Orientation:  Full (Time, Place, and Person)  Thought Content: Logical   Suicidal Thoughts:  No  Homicidal Thoughts:  No  Memory:  Immediate;   Good Recent;   Good Remote;   Good  Judgement:  Good  Insight:  Good  Psychomotor Activity:  NA  Concentration:  Concentration: Good  Recall:  Good  Fund of Knowledge: Good  Language: Good  Akathisia:  Negative  Handed:  Right  AIMS (if indicated): not done  Assets:  Communication Skills Desire for Improvement Financial Resources/Insurance Housing Resilience Talents/Skills  ADL's:  Intact  Cognition: WNL  Sleep:  Good   Screenings: AIMS    Flowsheet Row [facility]  AIMS Total Score 0   GAD-7    Flowsheet Row Office Visit from 06/09/2024 in [facility] Office Visit from 02/04/2024 in [facility] Office Visit from 11/13/2023 in [facility] Counselor from 02/12/2021 in [facility]  Total GAD-7 Score 17 0 0 17   PHQ2-9    Flowsheet Row Office Visit from 06/09/2024 in [facility] Office Visit from 02/04/2024 in [facility] Office Visit from 11/13/2023 in [facility] Counselor from 02/12/2021 in [facility] at [facility] Total Score 2 0 0 2  PHQ-9 Total Score 4 0 0 10   Flowsheet Row ED from 02/27/2024 in [facility] Emergency Department at [facility] ED to Hosp-Admission (Discharged) from 01/04/2024 in [facility] from 11/18/2023 in [facility] Department at [facility]  C-SSRS RISK CATEGORY No Risk No Risk No Risk     Assessment and Plan:     This patient's most likely diagnosis is of bipolar disorder.  At this time we will return her Seroquel  back to 600 mg.  She will continue taking Wellbutrin .  She continues Depakote  twice a day which she says is very helpful.  She says it makes her mood more even.  The patient has an adjustment disorder with an anxious mood state mainly related to her employment.  She is doing well on Xanax  1 mg 3 times daily which she has been on for many years.  This patient to return to see me in 4 months.  She is functioning very well. 11/14/2020, 3:58 PM

## 2024-08-28 ENCOUNTER — Other Ambulatory Visit: Payer: Self-pay | Admitting: Family Medicine

## 2024-08-28 DIAGNOSIS — E1165 Type 2 diabetes mellitus with hyperglycemia: Secondary | ICD-10-CM

## 2024-09-08 ENCOUNTER — Ambulatory Visit (HOSPITAL_COMMUNITY): Admitting: Psychiatry

## 2024-09-17 ENCOUNTER — Encounter

## 2024-10-01 ENCOUNTER — Encounter: Admitting: Gastroenterology

## 2024-10-07 ENCOUNTER — Ambulatory Visit: Admitting: Family Medicine

## 2024-12-22 ENCOUNTER — Ambulatory Visit (HOSPITAL_COMMUNITY): Admitting: Psychiatry

## 2025-06-15 ENCOUNTER — Encounter: Admitting: Family Medicine
